# Patient Record
Sex: Female | Born: 1955 | Race: White | Hispanic: No | Marital: Married | Smoking: Never smoker
Health system: Southern US, Community
[De-identification: ages and names within clinical notes are randomized; demographics above are authoritative.]

## PROBLEM LIST (undated history)

## (undated) DIAGNOSIS — H3320 Serous retinal detachment, unspecified eye: Secondary | ICD-10-CM

## (undated) DIAGNOSIS — M722 Plantar fascial fibromatosis: Secondary | ICD-10-CM

## (undated) DIAGNOSIS — H35039 Hypertensive retinopathy, unspecified eye: Secondary | ICD-10-CM

## (undated) DIAGNOSIS — D472 Monoclonal gammopathy: Secondary | ICD-10-CM

## (undated) DIAGNOSIS — N920 Excessive and frequent menstruation with regular cycle: Secondary | ICD-10-CM

## (undated) DIAGNOSIS — D499 Neoplasm of unspecified behavior of unspecified site: Secondary | ICD-10-CM

## (undated) DIAGNOSIS — N200 Calculus of kidney: Secondary | ICD-10-CM

## (undated) DIAGNOSIS — H43811 Vitreous degeneration, right eye: Secondary | ICD-10-CM

## (undated) DIAGNOSIS — I447 Left bundle-branch block, unspecified: Secondary | ICD-10-CM

## (undated) DIAGNOSIS — I10 Essential (primary) hypertension: Secondary | ICD-10-CM

## (undated) DIAGNOSIS — K635 Polyp of colon: Secondary | ICD-10-CM

## (undated) DIAGNOSIS — H269 Unspecified cataract: Secondary | ICD-10-CM

## (undated) HISTORY — DX: Vitreous degeneration, right eye: H43.811

## (undated) HISTORY — DX: Hypertensive retinopathy, unspecified eye: H35.039

## (undated) HISTORY — DX: Serous retinal detachment, unspecified eye: H33.20

## (undated) HISTORY — DX: Polyp of colon: K63.5

## (undated) HISTORY — PX: OTHER SURGICAL HISTORY: SHX169

## (undated) HISTORY — DX: Plantar fascial fibromatosis: M72.2

## (undated) HISTORY — DX: Monoclonal gammopathy: D47.2

## (undated) HISTORY — PX: EYE SURGERY: SHX253

## (undated) HISTORY — PX: CATARACT EXTRACTION: SUR2

## (undated) HISTORY — DX: Excessive and frequent menstruation with regular cycle: N92.0

## (undated) HISTORY — DX: Calculus of kidney: N20.0

## (undated) HISTORY — DX: Left bundle-branch block, unspecified: I44.7

## (undated) HISTORY — DX: Essential (primary) hypertension: I10

## (undated) HISTORY — PX: RETINAL DETACHMENT SURGERY: SHX105

## (undated) HISTORY — DX: Neoplasm of unspecified behavior of unspecified site: D49.9

## (undated) HISTORY — DX: Unspecified cataract: H26.9

---

## 1985-07-31 DIAGNOSIS — D499 Neoplasm of unspecified behavior of unspecified site: Secondary | ICD-10-CM

## 1985-07-31 HISTORY — DX: Neoplasm of unspecified behavior of unspecified site: D49.9

## 1999-03-08 ENCOUNTER — Other Ambulatory Visit: Admission: RE | Admit: 1999-03-08 | Discharge: 1999-03-08 | Payer: Self-pay | Admitting: Obstetrics and Gynecology

## 2000-03-12 ENCOUNTER — Other Ambulatory Visit: Admission: RE | Admit: 2000-03-12 | Discharge: 2000-03-12 | Payer: Self-pay | Admitting: *Deleted

## 2001-03-21 ENCOUNTER — Other Ambulatory Visit: Admission: RE | Admit: 2001-03-21 | Discharge: 2001-03-21 | Payer: Self-pay | Admitting: *Deleted

## 2001-05-31 ENCOUNTER — Encounter: Admission: RE | Admit: 2001-05-31 | Discharge: 2001-05-31 | Payer: Self-pay | Admitting: Family Medicine

## 2001-05-31 ENCOUNTER — Encounter: Payer: Self-pay | Admitting: Family Medicine

## 2001-07-31 DIAGNOSIS — K635 Polyp of colon: Secondary | ICD-10-CM

## 2001-07-31 HISTORY — DX: Polyp of colon: K63.5

## 2001-09-05 ENCOUNTER — Ambulatory Visit (HOSPITAL_COMMUNITY): Admission: RE | Admit: 2001-09-05 | Discharge: 2001-09-05 | Payer: Self-pay | Admitting: Gastroenterology

## 2001-11-26 ENCOUNTER — Ambulatory Visit (HOSPITAL_COMMUNITY): Admission: RE | Admit: 2001-11-26 | Discharge: 2001-11-26 | Payer: Self-pay | Admitting: Gastroenterology

## 2001-11-26 ENCOUNTER — Encounter (INDEPENDENT_AMBULATORY_CARE_PROVIDER_SITE_OTHER): Payer: Self-pay | Admitting: Specialist

## 2001-11-28 HISTORY — PX: LAPAROSCOPIC CHOLECYSTECTOMY: SUR755

## 2001-12-09 ENCOUNTER — Encounter (INDEPENDENT_AMBULATORY_CARE_PROVIDER_SITE_OTHER): Payer: Self-pay

## 2001-12-09 ENCOUNTER — Encounter: Payer: Self-pay | Admitting: Surgery

## 2001-12-09 ENCOUNTER — Observation Stay (HOSPITAL_COMMUNITY): Admission: RE | Admit: 2001-12-09 | Discharge: 2001-12-10 | Payer: Self-pay | Admitting: Surgery

## 2002-07-03 ENCOUNTER — Other Ambulatory Visit: Admission: RE | Admit: 2002-07-03 | Discharge: 2002-07-03 | Payer: Self-pay | Admitting: *Deleted

## 2002-11-10 ENCOUNTER — Encounter (INDEPENDENT_AMBULATORY_CARE_PROVIDER_SITE_OTHER): Payer: Self-pay | Admitting: Specialist

## 2002-11-10 ENCOUNTER — Ambulatory Visit (HOSPITAL_BASED_OUTPATIENT_CLINIC_OR_DEPARTMENT_OTHER): Admission: RE | Admit: 2002-11-10 | Discharge: 2002-11-10 | Payer: Self-pay | Admitting: Obstetrics and Gynecology

## 2002-11-10 HISTORY — PX: ABLATION: SHX5711

## 2003-07-08 ENCOUNTER — Other Ambulatory Visit: Admission: RE | Admit: 2003-07-08 | Discharge: 2003-07-08 | Payer: Self-pay | Admitting: *Deleted

## 2004-07-12 ENCOUNTER — Other Ambulatory Visit: Admission: RE | Admit: 2004-07-12 | Discharge: 2004-07-12 | Payer: Self-pay | Admitting: *Deleted

## 2005-07-28 ENCOUNTER — Other Ambulatory Visit: Admission: RE | Admit: 2005-07-28 | Discharge: 2005-07-28 | Payer: Self-pay | Admitting: Obstetrics and Gynecology

## 2006-09-21 ENCOUNTER — Other Ambulatory Visit: Admission: RE | Admit: 2006-09-21 | Discharge: 2006-09-21 | Payer: Self-pay | Admitting: Obstetrics and Gynecology

## 2007-05-10 ENCOUNTER — Encounter: Admission: RE | Admit: 2007-05-10 | Discharge: 2007-05-10 | Payer: Self-pay | Admitting: Family Medicine

## 2007-07-04 ENCOUNTER — Encounter: Admission: RE | Admit: 2007-07-04 | Discharge: 2007-07-04 | Payer: Self-pay | Admitting: Gastroenterology

## 2007-07-11 ENCOUNTER — Ambulatory Visit: Payer: Self-pay | Admitting: Oncology

## 2007-07-30 LAB — CBC WITH DIFFERENTIAL/PLATELET
Basophils Absolute: 0.1 10*3/uL (ref 0.0–0.1)
EOS%: 1.9 % (ref 0.0–7.0)
Eosinophils Absolute: 0.1 10*3/uL (ref 0.0–0.5)
HGB: 14.4 g/dL (ref 11.6–15.9)
MCH: 30.6 pg (ref 26.0–34.0)
NEUT#: 4.7 10*3/uL (ref 1.5–6.5)
RDW: 13.3 % (ref 11.3–14.5)
WBC: 6.6 10*3/uL (ref 3.9–10.0)
lymph#: 1.4 10*3/uL (ref 0.9–3.3)

## 2007-08-02 LAB — IMMUNOFIXATION ELECTROPHORESIS
IgA: 82 mg/dL (ref 68–378)
IgG (Immunoglobin G), Serum: 1470 mg/dL (ref 694–1618)
IgM, Serum: 60 mg/dL (ref 60–263)

## 2007-08-02 LAB — LACTATE DEHYDROGENASE: LDH: 123 U/L (ref 94–250)

## 2007-08-02 LAB — BETA 2 MICROGLOBULIN, SERUM: Beta-2 Microglobulin: 1.54 mg/L (ref 1.01–1.73)

## 2007-08-02 LAB — COMPREHENSIVE METABOLIC PANEL
AST: 17 U/L (ref 0–37)
Albumin: 4.8 g/dL (ref 3.5–5.2)
BUN: 10 mg/dL (ref 6–23)
Calcium: 10.2 mg/dL (ref 8.4–10.5)
Chloride: 101 mEq/L (ref 96–112)
Potassium: 3.9 mEq/L (ref 3.5–5.3)

## 2007-08-07 LAB — UIFE/LIGHT CHAINS/TP QN, 24-HR UR
Free Kappa Lt Chains,Ur: 0.64 mg/dL (ref 0.04–1.51)
Free Lambda Excretion/Day: 0.79 mg/d
Free Lambda Lt Chains,Ur: 0.09 mg/dL (ref 0.08–1.01)
Time: 24 hours
Total Protein, Urine: 1.4 mg/dL
Volume, Urine: 875 mL

## 2007-08-07 LAB — CREATININE CLEARANCE, URINE, 24 HOUR
Creatinine, 24H Ur: 1143 mg/d (ref 700–1800)
Creatinine, Urine: 130.6 mg/dL

## 2007-10-08 ENCOUNTER — Other Ambulatory Visit: Admission: RE | Admit: 2007-10-08 | Discharge: 2007-10-08 | Payer: Self-pay | Admitting: Obstetrics and Gynecology

## 2007-10-22 ENCOUNTER — Ambulatory Visit: Payer: Self-pay | Admitting: Oncology

## 2008-04-21 ENCOUNTER — Ambulatory Visit: Payer: Self-pay | Admitting: Oncology

## 2008-04-23 LAB — COMPREHENSIVE METABOLIC PANEL
AST: 15 U/L (ref 0–37)
Alkaline Phosphatase: 65 U/L (ref 39–117)
BUN: 14 mg/dL (ref 6–23)
Creatinine, Ser: 0.74 mg/dL (ref 0.40–1.20)
Total Bilirubin: 0.5 mg/dL (ref 0.3–1.2)

## 2008-04-23 LAB — IGG, IGA, IGM
IgA: 71 mg/dL (ref 68–378)
IgM, Serum: 61 mg/dL (ref 60–263)

## 2008-04-23 LAB — CBC WITH DIFFERENTIAL/PLATELET
Basophils Absolute: 0 10*3/uL (ref 0.0–0.1)
EOS%: 1.7 % (ref 0.0–7.0)
HCT: 38.7 % (ref 34.8–46.6)
HGB: 13.3 g/dL (ref 11.6–15.9)
MCH: 31.2 pg (ref 26.0–34.0)
MCHC: 34.4 g/dL (ref 32.0–36.0)
MCV: 90.8 fL (ref 81.0–101.0)
MONO%: 7.5 % (ref 0.0–13.0)
NEUT%: 68.5 % (ref 39.6–76.8)
RDW: 12.9 % (ref 11.3–14.5)

## 2008-10-09 ENCOUNTER — Other Ambulatory Visit: Admission: RE | Admit: 2008-10-09 | Discharge: 2008-10-09 | Payer: Self-pay | Admitting: Obstetrics and Gynecology

## 2008-10-29 ENCOUNTER — Ambulatory Visit: Payer: Self-pay | Admitting: Oncology

## 2008-11-03 LAB — CBC WITH DIFFERENTIAL/PLATELET
Basophils Absolute: 0 10*3/uL (ref 0.0–0.1)
Eosinophils Absolute: 0.1 10*3/uL (ref 0.0–0.5)
HCT: 40 % (ref 34.8–46.6)
LYMPH%: 19.4 % (ref 14.0–49.7)
MCV: 89.7 fL (ref 79.5–101.0)
MONO#: 0.4 10*3/uL (ref 0.1–0.9)
MONO%: 5.7 % (ref 0.0–14.0)
NEUT#: 5.2 10*3/uL (ref 1.5–6.5)
NEUT%: 72.2 % (ref 38.4–76.8)
Platelets: 283 10*3/uL (ref 145–400)
RBC: 4.46 10*6/uL (ref 3.70–5.45)

## 2008-11-03 LAB — COMPREHENSIVE METABOLIC PANEL
BUN: 14 mg/dL (ref 6–23)
CO2: 25 mEq/L (ref 19–32)
Calcium: 10 mg/dL (ref 8.4–10.5)
Chloride: 109 mEq/L (ref 96–112)
Creatinine, Ser: 0.64 mg/dL (ref 0.40–1.20)
Glucose, Bld: 123 mg/dL — ABNORMAL HIGH (ref 70–99)
Total Bilirubin: 0.4 mg/dL (ref 0.3–1.2)

## 2008-11-03 LAB — IGG, IGA, IGM
IgA: 67 mg/dL — ABNORMAL LOW (ref 68–378)
IgG (Immunoglobin G), Serum: 1240 mg/dL (ref 694–1618)

## 2008-11-03 LAB — LACTATE DEHYDROGENASE: LDH: 131 U/L (ref 94–250)

## 2009-05-04 ENCOUNTER — Ambulatory Visit: Payer: Self-pay | Admitting: Oncology

## 2009-05-06 LAB — COMPREHENSIVE METABOLIC PANEL
ALT: 27 U/L (ref 0–35)
AST: 26 U/L (ref 0–37)
BUN: 9 mg/dL (ref 6–23)
Calcium: 9.4 mg/dL (ref 8.4–10.5)
Chloride: 107 mEq/L (ref 96–112)
Creatinine, Ser: 0.77 mg/dL (ref 0.40–1.20)
Total Bilirubin: 0.6 mg/dL (ref 0.3–1.2)

## 2009-05-06 LAB — CBC WITH DIFFERENTIAL/PLATELET
BASO%: 0.3 % (ref 0.0–2.0)
Basophils Absolute: 0 10*3/uL (ref 0.0–0.1)
EOS%: 1.8 % (ref 0.0–7.0)
HCT: 39.3 % (ref 34.8–46.6)
HGB: 13.6 g/dL (ref 11.6–15.9)
LYMPH%: 18 % (ref 14.0–49.7)
MCH: 31.1 pg (ref 25.1–34.0)
MCHC: 34.7 g/dL (ref 31.5–36.0)
MCV: 89.8 fL (ref 79.5–101.0)
NEUT%: 72.9 % (ref 38.4–76.8)
Platelets: 260 10*3/uL (ref 145–400)
lymph#: 1.2 10*3/uL (ref 0.9–3.3)

## 2009-05-06 LAB — LACTATE DEHYDROGENASE: LDH: 114 U/L (ref 94–250)

## 2009-05-06 LAB — IGG, IGA, IGM: IgG (Immunoglobin G), Serum: 1240 mg/dL (ref 694–1618)

## 2009-11-03 ENCOUNTER — Ambulatory Visit: Payer: Self-pay | Admitting: Oncology

## 2009-11-05 LAB — CBC WITH DIFFERENTIAL/PLATELET
BASO%: 0.1 % (ref 0.0–2.0)
Basophils Absolute: 0 10*3/uL (ref 0.0–0.1)
EOS%: 2.7 % (ref 0.0–7.0)
Eosinophils Absolute: 0.2 10*3/uL (ref 0.0–0.5)
HCT: 39.8 % (ref 34.8–46.6)
HGB: 13.9 g/dL (ref 11.6–15.9)
LYMPH%: 18.7 % (ref 14.0–49.7)
MCH: 32.5 pg (ref 25.1–34.0)
MCHC: 35 g/dL (ref 31.5–36.0)
MCV: 92.8 fL (ref 79.5–101.0)
MONO#: 0.4 10*3/uL (ref 0.1–0.9)
MONO%: 6.2 % (ref 0.0–14.0)
NEUT#: 4.5 10*3/uL (ref 1.5–6.5)
NEUT%: 72.3 % (ref 38.4–76.8)
Platelets: 238 10*3/uL (ref 145–400)
RBC: 4.28 10*6/uL (ref 3.70–5.45)
RDW: 13.1 % (ref 11.2–14.5)
WBC: 6.3 10*3/uL (ref 3.9–10.3)
lymph#: 1.2 10*3/uL (ref 0.9–3.3)

## 2009-11-05 LAB — COMPREHENSIVE METABOLIC PANEL
ALT: 35 U/L (ref 0–35)
CO2: 26 mEq/L (ref 19–32)
Calcium: 9.9 mg/dL (ref 8.4–10.5)
Chloride: 108 mEq/L (ref 96–112)
Creatinine, Ser: 0.67 mg/dL (ref 0.40–1.20)
Total Protein: 7 g/dL (ref 6.0–8.3)

## 2009-11-05 LAB — IGG, IGA, IGM: IgM, Serum: 48 mg/dL — ABNORMAL LOW (ref 60–263)

## 2009-11-05 LAB — LACTATE DEHYDROGENASE: LDH: 106 U/L (ref 94–250)

## 2010-05-04 ENCOUNTER — Ambulatory Visit: Payer: Self-pay | Admitting: Oncology

## 2010-05-06 LAB — COMPREHENSIVE METABOLIC PANEL
ALT: 74 U/L — ABNORMAL HIGH (ref 0–35)
AST: 48 U/L — ABNORMAL HIGH (ref 0–37)
Albumin: 4.4 g/dL (ref 3.5–5.2)
Alkaline Phosphatase: 73 U/L (ref 39–117)
BUN: 10 mg/dL (ref 6–23)
CO2: 21 mEq/L (ref 19–32)
Calcium: 9.6 mg/dL (ref 8.4–10.5)
Chloride: 106 mEq/L (ref 96–112)
Creatinine, Ser: 0.64 mg/dL (ref 0.40–1.20)
Glucose, Bld: 102 mg/dL — ABNORMAL HIGH (ref 70–99)
Potassium: 3.9 mEq/L (ref 3.5–5.3)
Sodium: 139 mEq/L (ref 135–145)
Total Bilirubin: 0.4 mg/dL (ref 0.3–1.2)
Total Protein: 6.9 g/dL (ref 6.0–8.3)

## 2010-05-06 LAB — CBC WITH DIFFERENTIAL/PLATELET
BASO%: 0.4 % (ref 0.0–2.0)
Basophils Absolute: 0 10*3/uL (ref 0.0–0.1)
EOS%: 3 % (ref 0.0–7.0)
Eosinophils Absolute: 0.2 10*3/uL (ref 0.0–0.5)
HCT: 40.8 % (ref 34.8–46.6)
HGB: 13.9 g/dL (ref 11.6–15.9)
LYMPH%: 20.3 % (ref 14.0–49.7)
MCH: 31.5 pg (ref 25.1–34.0)
MCHC: 33.9 g/dL (ref 31.5–36.0)
MCV: 92.9 fL (ref 79.5–101.0)
MONO#: 0.5 10*3/uL (ref 0.1–0.9)
MONO%: 8.3 % (ref 0.0–14.0)
NEUT#: 4.3 10*3/uL (ref 1.5–6.5)
NEUT%: 68 % (ref 38.4–76.8)
Platelets: 262 10*3/uL (ref 145–400)
RBC: 4.4 10*6/uL (ref 3.70–5.45)
RDW: 12.8 % (ref 11.2–14.5)
WBC: 6.3 10*3/uL (ref 3.9–10.3)
lymph#: 1.3 10*3/uL (ref 0.9–3.3)

## 2010-05-06 LAB — LACTATE DEHYDROGENASE: LDH: 121 U/L (ref 94–250)

## 2010-05-06 LAB — IGG, IGA, IGM
IgA: 61 mg/dL — ABNORMAL LOW (ref 68–378)
IgG (Immunoglobin G), Serum: 1200 mg/dL (ref 694–1618)
IgM, Serum: 47 mg/dL — ABNORMAL LOW (ref 60–263)

## 2010-07-31 DIAGNOSIS — I447 Left bundle-branch block, unspecified: Secondary | ICD-10-CM

## 2010-07-31 HISTORY — PX: OTHER SURGICAL HISTORY: SHX169

## 2010-07-31 HISTORY — DX: Left bundle-branch block, unspecified: I44.7

## 2010-11-15 ENCOUNTER — Other Ambulatory Visit (HOSPITAL_COMMUNITY): Payer: Self-pay | Admitting: Oncology

## 2010-11-15 ENCOUNTER — Encounter (HOSPITAL_BASED_OUTPATIENT_CLINIC_OR_DEPARTMENT_OTHER): Payer: 59 | Admitting: Oncology

## 2010-11-15 DIAGNOSIS — D472 Monoclonal gammopathy: Secondary | ICD-10-CM

## 2010-11-15 LAB — COMPREHENSIVE METABOLIC PANEL
ALT: 81 U/L — ABNORMAL HIGH (ref 0–35)
AST: 47 U/L — ABNORMAL HIGH (ref 0–37)
Creatinine, Ser: 0.74 mg/dL (ref 0.40–1.20)
Total Bilirubin: 0.5 mg/dL (ref 0.3–1.2)

## 2010-11-15 LAB — CBC WITH DIFFERENTIAL/PLATELET
BASO%: 0.3 % (ref 0.0–2.0)
EOS%: 2.4 % (ref 0.0–7.0)
HCT: 40.9 % (ref 34.8–46.6)
LYMPH%: 25.4 % (ref 14.0–49.7)
MCH: 31.3 pg (ref 25.1–34.0)
MCHC: 34.6 g/dL (ref 31.5–36.0)
MCV: 90.3 fL (ref 79.5–101.0)
MONO%: 8.8 % (ref 0.0–14.0)
NEUT%: 63.1 % (ref 38.4–76.8)
Platelets: 246 10*3/uL (ref 145–400)

## 2010-11-15 LAB — LACTATE DEHYDROGENASE: LDH: 124 U/L (ref 94–250)

## 2010-12-16 NOTE — Op Note (Signed)
Erika Bolton, Erika Bolton                          ACCOUNT NO.:  [id number]   MEDICAL RECORD NO.:  [id number]                   PATIENT TYPE:  AMB   LOCATION:  [facility]                                 FACILITY:  [facility]   PHYSICIAN:  Cynthia P. Romine, M.D.             DATE OF BIRTH:  Sep 21, 1955   DATE OF PROCEDURE:  11/10/2002  DATE OF DISCHARGE:                                 OPERATIVE REPORT   PREOPERATIVE DIAGNOSES:  Menorrhagia, intramural and submucous fibroids.   POSTOPERATIVE DIAGNOSES:  Menorrhagia, intramural and submucous fibroids.   PROCEDURE:  Hysteroscopy, hydrotherm ablation of the endometrium, removal of  endocervical polyp, endometrial biopsy.   SURGEON:  Cynthia P. Romine, M.D.   ANESTHESIA:  General endotracheal.   ESTIMATED BLOOD LOSS:  25 mL primarily from the tenaculum site on the  cervix.   COMPLICATIONS:  None.   DESCRIPTION OF PROCEDURE:  The patient was taken to the operating room and  after the induction of the adequate general anesthesia by LMA was placed in  the dorsal lithotomy position and prepped and draped in the usual fashion.  She had voided immediately prior to surgery therefore catheterization was  not done. The cervix was grasped at the anterior lip with a single tooth  tenaculum and sounded to 10 cm. The cervix was then dilated to a #21 Shawnie Pons.  The HEA hysteroscope was introduced, hysteroscopy was carried out. There was  some difficulty with achieving adequate lighting and the light source and  scope were changed to allow adequate visualization of the endometrial  cavity. The two known fibroids were visualized, photographic documentation  was taken. The left ostia could be seen, the right was obstructed by the  fibroid. It should be noted that prior to the hysteroscopy, the __________  endometrial biopsy was done and the specimen sent to pathology. Once there  was adequate visualization of the endometrial cavity, hydrotherm ablation  was  carried out by the manufacturer's protocol, this was done without  complication. A four prong tenaculum was necessary to be placed around the  scope to prevent leaking of the water back into the vagina. This worked  Agricultural consultant and there was no leaking during the procedure. Upon completion  of the procedure, photographic documentation was taken of the blanching of  the endometrium over the fibroid. The scope was removed. Polyp forceps were  used to remove the endocervical polyp and endocervix was gently curetted and  the specimen sent the pathology and the procedure was terminated. The  patient tolerated it well and went in satisfactory condition to post  anesthesia recovery.                                               Cynthia P. Romine, M.D.    CPR/MEDQ  D:  11/10/2002  T:  11/10/2002  Job:  [id number]

## 2011-05-11 ENCOUNTER — Ambulatory Visit
Admission: RE | Admit: 2011-05-11 | Discharge: 2011-05-11 | Disposition: A | Discharge status: Home / Self Care | Payer: 59 | Source: Ambulatory Visit | Attending: Cardiology | Admitting: Cardiology

## 2011-05-11 ENCOUNTER — Other Ambulatory Visit: Payer: Self-pay | Admitting: Cardiology

## 2011-05-11 DIAGNOSIS — Z01811 Encounter for preprocedural respiratory examination: Secondary | ICD-10-CM

## 2011-05-17 ENCOUNTER — Ambulatory Visit (HOSPITAL_COMMUNITY)
Admission: RE | Admit: 2011-05-17 | Discharge: 2011-05-17 | Disposition: A | Discharge status: Home / Self Care | Payer: 59 | Source: Ambulatory Visit | Attending: Cardiology | Admitting: Cardiology

## 2011-05-17 DIAGNOSIS — I447 Left bundle-branch block, unspecified: Secondary | ICD-10-CM | POA: Insufficient documentation

## 2011-05-17 DIAGNOSIS — R9439 Abnormal result of other cardiovascular function study: Secondary | ICD-10-CM | POA: Insufficient documentation

## 2011-05-17 DIAGNOSIS — Z01810 Encounter for preprocedural cardiovascular examination: Secondary | ICD-10-CM | POA: Insufficient documentation

## 2011-05-19 NOTE — Cardiovascular Report (Signed)
[name], [name] NO.:  [id number]  MEDICAL RECORD NO.:  [id number]  LOCATION:  [facility]                         FACILITY:  [facility]  PHYSICIAN:  Landry Corporal, MD DATE OF BIRTH:  [birth year]  DATE OF PROCEDURE:  05/17/2011 DATE OF DISCHARGE:  05/17/2011                           CARDIAC CATHETERIZATION   PERFORMING PHYSICIAN:  Landry Corporal, MD  PRIMARY CARDIOLOGIST:  Landry Corporal, MD  PRIMARY PHYSICIAN:  Erika R. Collins [place], MD  PROCEDURE PERFORMED: 1. Left heart catheterization via the 5-French right femoral artery     access. 2. Left ventriculography in the RAO projection with 10 mL of contrast     per second for a total of 30 mL. 3. Native coronary angiography.  INDICATIONS: 1. Left bundle branch block. 2. Abnormal nuclear stress test.  BRIEF HISTORY:  Erika Bolton is a very pleasant 55 year old woman who I saw in consultation for abnormal ECG, which showed a left bundle branch block.  An echocardiogram with a septal wall motion abnormality consistent with left bundle branch block.  She is relatively asymptomatic as far as any potential anginal-type symptom, however, to evaluate the etiology of left bundle branch block, she underwent nuclear stress test which demonstrated concern for possible anterior defect. Therefore, it was recommended that she undergo diagnostic cardiac catheterization.  The risks, benefits, alternatives and indications of the procedure were explained to the patient in detail as delineated in her last clinic note.  After some time of deliberation, the patient agreed to proceed, voiced understanding of the procedure.  Informed consent was obtained with signed form placed on the chart.  PROCEDURE:  The patient was brought to Second [facility] in the fasting state and sent to the holding area. She was prepped and draped in usual sterile fashion for femoral artery access.  After  time-out period performed, the patient was sedated with intravenous Versed and fentanyl.  The right groin was anesthetized using 1% subcutaneous lidocaine after the right femoral head was localized using tactile fluoroscopic guidance.  Initially, the right common femoral artery was accessed.  However, I was unable to successfully see the wire and therefore the wire was removed and needle removed with direct manual pressure being held for hemostasis.  I then relocated the femoral head.  I was able to re-access the common femoral artery with modified Seldinger technique and a 5-French sheath was easily placed at this time.  Sheath was aspirated and flushed.  First, a 5-French JL-4 followed by 5-French JR-4 catheter was advanced over wire and multiple angiographic views of the left and right coronary systems were obtained. The JR-4 catheter was then exchanged over wire for an angled pigtail catheter, which was used to advance across the aortic valve.  Left ventriculography was then performed in the RAO projection and then the catheter was pulled back across the aortic valve measuring pullback gradient.  The catheter was then removed completely out of body over a wire without any complications.  The patient was stable before, during, and after the procedure with no complications.  Estimated blood loss was 10 mL.  CATH LAB STATISTICS: 1. Sedation:  3 mg Versed and  50 mcg of fentanyl. 2. Contrast:  60 mL.  HEMODYNAMICS: 1. Central aortic pressure 121/70 mmHg with a mean of 92 mmHg. 2. Aortic pressure 126/7 mmHg with an EDP of 7 mmHg. 3. Left ventriculography demonstrated an ejection fraction of at least     50%-55% with no significant wall motion abnormalities noted in the     RAO projection.  ANGIOGRAPHIC FINDINGS: 1. The left main is a short vessel that trifurcates into an LAD. 2. Ramus intermedius.  It is a large branching ramus intermedius and a     circumflex vessel, which is  essentially a large obtuse marginal     branch. 3. The LAD is a moderate-to-large caliber vessel.  It gives rise to     mostly the large septal trunk and then another diagonal branch and     reaches down toward the apex.  There is no significant disease in     the LAD. 4. The ramus is a large caliber vessel, almost greater than the LAD     itself.  It bifurcates halfway down and branches into several small     branches distally and reaches almost down to the apex.  No disease     noted. 5. The circumflex is again mostly an obtuse marginal branch, which     trifurcates down to its bottom with a very small posterolateral     branch with not much in the way of an atrioventricular groove     vessel.  There is no significant disease in this vessel either. 6. The right coronary artery is a dominant vessel.  There is no     disease down to the crux and then bifurcates into the     posterolateral system of the right atrioventricular groove vessel     branching into 2 posterolateral branches.  No disease in the distal     portion of the vessel either.  The right posterior descending     artery also is a __________ large-sized vessel with no significant     disease.  IMPRESSION: 1. No angiographic evidence of any coronary artery disease to explain     the abnormal EKG or stress test results. 2. Preserved left ventricular ejection fraction with normal end-     diastolic pressure.  PLAN:  Standard post cath care and we will discharge the patient today. After bedrest, she will follow up with me in clinic just for postcatheterization followup.  We will continue with current medications.          ______________________________ Landry Corporal, MD     DWH/MEDQ  D:  05/17/2011  T:  05/18/2011  Job:  [id number]  cc:   Erika Bolton, M.D. [facility] Cardiac Catheter  Electronically Signed by Bryan Lemma MD on 05/19/2011 03:14:38 PM

## 2011-05-29 ENCOUNTER — Encounter: Payer: 59 | Admitting: Oncology

## 2011-05-29 ENCOUNTER — Other Ambulatory Visit (HOSPITAL_COMMUNITY): Payer: Self-pay | Admitting: Oncology

## 2011-05-30 LAB — IGG, IGA, IGM
IgA: 69 mg/dL (ref 69–380)
IgM, Serum: 40 mg/dL — ABNORMAL LOW (ref 52–322)

## 2011-10-30 ENCOUNTER — Telehealth: Payer: Self-pay | Admitting: Oncology

## 2011-10-30 NOTE — Telephone Encounter (Signed)
pt called to r/s 5/6 appts to 5/20   aom

## 2011-10-30 NOTE — Telephone Encounter (Signed)
lmonvm for pt re appt for 5/6. Schedule mailed.

## 2011-12-04 ENCOUNTER — Ambulatory Visit: Payer: 59 | Admitting: Oncology

## 2011-12-04 ENCOUNTER — Other Ambulatory Visit: Payer: 59 | Admitting: Lab

## 2011-12-08 ENCOUNTER — Telehealth: Payer: Self-pay | Admitting: Oncology

## 2011-12-08 NOTE — Telephone Encounter (Signed)
pt called in to r/s 5.20 appt to 6/7  aom

## 2011-12-18 ENCOUNTER — Ambulatory Visit: Payer: 59 | Admitting: Oncology

## 2011-12-18 ENCOUNTER — Other Ambulatory Visit: Payer: 59 | Admitting: Lab

## 2012-01-05 ENCOUNTER — Ambulatory Visit (HOSPITAL_BASED_OUTPATIENT_CLINIC_OR_DEPARTMENT_OTHER): Payer: 59 | Admitting: Oncology

## 2012-01-05 ENCOUNTER — Telehealth: Payer: Self-pay | Admitting: Oncology

## 2012-01-05 ENCOUNTER — Encounter: Payer: Self-pay | Admitting: Oncology

## 2012-01-05 ENCOUNTER — Other Ambulatory Visit (HOSPITAL_BASED_OUTPATIENT_CLINIC_OR_DEPARTMENT_OTHER): Payer: 59 | Admitting: Lab

## 2012-01-05 VITALS — BP 119/80 | HR 81 | Temp 97.7°F | Ht 64.0 in | Wt 158.4 lb

## 2012-01-05 DIAGNOSIS — Z86010 Personal history of colonic polyps: Secondary | ICD-10-CM

## 2012-01-05 DIAGNOSIS — D472 Monoclonal gammopathy: Secondary | ICD-10-CM

## 2012-01-05 DIAGNOSIS — G5790 Unspecified mononeuropathy of unspecified lower limb: Secondary | ICD-10-CM

## 2012-01-05 DIAGNOSIS — R7401 Elevation of levels of liver transaminase levels: Secondary | ICD-10-CM

## 2012-01-05 LAB — COMPREHENSIVE METABOLIC PANEL
ALT: 22 U/L (ref 0–35)
AST: 20 U/L (ref 0–37)
Albumin: 4 g/dL (ref 3.5–5.2)
Calcium: 10.2 mg/dL (ref 8.4–10.5)
Chloride: 104 mEq/L (ref 96–112)
Potassium: 3.8 mEq/L (ref 3.5–5.3)
Total Protein: 7.5 g/dL (ref 6.0–8.3)

## 2012-01-05 LAB — CBC WITH DIFFERENTIAL/PLATELET
BASO%: 0.7 % (ref 0.0–2.0)
Basophils Absolute: 0 10*3/uL (ref 0.0–0.1)
EOS%: 2.5 % (ref 0.0–7.0)
HGB: 13.6 g/dL (ref 11.6–15.9)
MCH: 30.5 pg (ref 25.1–34.0)
RDW: 12.9 % (ref 11.2–14.5)
lymph#: 1.3 10*3/uL (ref 0.9–3.3)

## 2012-01-05 NOTE — Telephone Encounter (Signed)
gv pt appt schedule for dec 2013 and June 2014.

## 2012-01-05 NOTE — Progress Notes (Signed)
CC:   Erika Bolton, M.D. Erika Bolton, M.D.  PROBLEM LIST: 1. 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  In     addition, a 24-hour urine protein determination from January 2009     was normal.  The patient has not had a metastatic bone survey or     bone marrow exam. 2. 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 3. Rosacea. 4. Intermittent elevations of SGOT and SGPT. 5. History of gastroesophageal reflux disease. 6. History of adenomatous polyps and positive family history of colon     cancer in the patient's father. 7. History of bilateral plantar fasciitis. 8. Status post laparoscopic cholecystectomy in 2003.  MEDICATIONS: 1. Aspirin 81 mg daily. 2. Wellbutrin XL 300 mg daily. 3. Calcium-vitamin D 250-100 mg daily. 4. Omega-3 fatty acids 1000 mg daily. 5. MetroGel 1% gel applied topically for rosacea. 6. Retin-A 0.01% gel applied at bedtime as needed for rosacea. 7. Prilosec 40 mg daily. 8. Desyrel 100 mg at bedtime.  HISTORY:  I saw Erika Bolton today for followup of her IgG kappa monoclonal gammopathy.  Erika Bolton was last seen by [place] on 11/15/2010. Quantitative immunoglobulins at that time and 6 months later were unchanged and within the normal range.  In general, Erika Bolton is doing well.  Erika Bolton denies any musculoskeletal pains or other symptoms to suggest progression of multiple myeloma.  Erika Bolton underwent a cardiac catheterization by Dr. Bryan Lemma in October 2012 for an abnormal stress test, EKG and 2D echocardiogram.  Erika Bolton underwent surgery on her left lower leg by Dr. Lestine Box in November 2012 in the hopes of improving her plantar fasciitis.  Erika Bolton has intermittent symptoms from her peripheral neuropathy in her feet.  Erika Bolton is bothered by her plantar fasciitis.  Her rosacea is  under good control.  Erika Bolton has been treated for urinary tract infections.  In general, Erika Bolton feels well at this time.  PHYSICAL EXAMINATION:  Erika Bolton looks well.  Erika Bolton recently turned 56. Weight is fairly stable at 158.4 pounds today.  Height 5 feet 4 inches, body surface area 1.8 sq. m.  Blood pressure 119/80.  Other vital signs are normal.  There is no scleral icterus.  Mouth and pharynx are benign. No peripheral adenopathy palpable.  Heart and lungs:  Normal.  Breasts: Not examined.  Erika Bolton has regular yearly mammograms.  Abdomen:  Benign with no organomegaly or masses palpable.  Extremities:  No peripheral edema or clubbing.  No areas of musculoskeletal tenderness.  Neurologic: Normal.  LABORATORY DATA:  White count 6.2, ANC 4.2, hemoglobin 13.6, hematocrit 40.7, platelets 241,000.  Chemistries today were entirely normal including an AST of 20, ALT of 22.  On 11/15/2010, AST was 47, ALT 81. Similar elevations were seen on 05/06/2010.  Quantitative immunoglobulins from today are pending.  Quantitative immunoglobulins from 05/29/2011 were as follows:  IgG was 1340, IgA 69 and IgM of 40. On 11/15/2010 those values were 1362 and 41, respectively.  There have been no significant changes in the quantitative immunoglobulins over the past 4-1/2 years.  IMAGING STUDIES: 1. Ultrasound of the abdomen from 05/31/2001 showed a 1.8 cm solitary     mobile gallstone without evidence of gallbladder wall thickening or     pericholecystic fluid.  There was an 8 mm cyst in the upper pole of  the right kidney. 2. MRI of the brain with and without IV contrast on 05/10/2007 showed     a small remote left cerebellar infarct.  There were scattered foci     of subcortical and periventricular deep white matter change.     Question of small-vessel disease was raised.  There was no acute     stroke or abnormal intracranial enhancement. 3. Ultrasound of the abdomen on 07/04/2007 showed no significant      abnormalities.  The gallbladder was surgically absent. 4. Chest x-ray, 2-view, from 05/11/2011 showed no acute abnormalities.  IMPRESSION AND PLAN:  Quantitative immunoglobulins have been  stable.  Today's values are pending. The patient has had a slightly low IgA and IgM value in the past and the value from October in April of 2012 for IgM was slightly low with normal being 52- 322 and the normal value for IgA being 69-380.  Nevertheless, the I feel that we are dealing with a monoclonal gammopathy that appears to be benign at least at this time.  The patient was reassured.  I suggested continued observation with repeat quantitative immunoglobulins in 6 months and repeat labs and visit in 1 year.  The patient was given a copy of her CBC and chemistries for today.  We may want to consider repeating urine studies in the future. ______________________________ Samul Dada, M.D. DSM/MEDQ  D:  01/05/2012  T:  01/05/2012  Job:  [id number]

## 2012-01-05 NOTE — Progress Notes (Signed)
This office note has been dictated.  #[id number]

## 2012-02-16 ENCOUNTER — Other Ambulatory Visit: Payer: Self-pay | Admitting: Dermatology

## 2012-02-24 ENCOUNTER — Emergency Department (HOSPITAL_COMMUNITY)
Admission: EM | Admit: 2012-02-24 | Discharge: 2012-02-24 | Disposition: A | Discharge status: Home / Self Care | Payer: 59 | Attending: Emergency Medicine | Admitting: Emergency Medicine

## 2012-02-24 ENCOUNTER — Encounter (HOSPITAL_COMMUNITY): Payer: Self-pay | Admitting: Emergency Medicine

## 2012-02-24 DIAGNOSIS — S81039A Puncture wound without foreign body, unspecified knee, initial encounter: Secondary | ICD-10-CM

## 2012-02-24 DIAGNOSIS — R209 Unspecified disturbances of skin sensation: Secondary | ICD-10-CM | POA: Insufficient documentation

## 2012-02-24 DIAGNOSIS — X58XXXA Exposure to other specified factors, initial encounter: Secondary | ICD-10-CM | POA: Insufficient documentation

## 2012-02-24 DIAGNOSIS — R0789 Other chest pain: Secondary | ICD-10-CM | POA: Insufficient documentation

## 2012-02-24 DIAGNOSIS — Z23 Encounter for immunization: Secondary | ICD-10-CM | POA: Insufficient documentation

## 2012-02-24 DIAGNOSIS — R110 Nausea: Secondary | ICD-10-CM | POA: Insufficient documentation

## 2012-02-24 DIAGNOSIS — S81009A Unspecified open wound, unspecified knee, initial encounter: Secondary | ICD-10-CM | POA: Insufficient documentation

## 2012-02-24 DIAGNOSIS — Z79899 Other long term (current) drug therapy: Secondary | ICD-10-CM | POA: Insufficient documentation

## 2012-02-24 DIAGNOSIS — I447 Left bundle-branch block, unspecified: Secondary | ICD-10-CM | POA: Insufficient documentation

## 2012-02-24 DIAGNOSIS — R51 Headache: Secondary | ICD-10-CM | POA: Insufficient documentation

## 2012-02-24 DIAGNOSIS — S91009A Unspecified open wound, unspecified ankle, initial encounter: Secondary | ICD-10-CM | POA: Insufficient documentation

## 2012-02-24 MED ORDER — TETANUS-DIPHTH-ACELL PERTUSSIS 5-2.5-18.5 LF-MCG/0.5 IM SUSP
0.5000 mL | Freq: Once | INTRAMUSCULAR | Status: AC
  Administered 2012-02-24: 0.5 mL via INTRAMUSCULAR
  Filled 2012-02-24: qty 0.5

## 2012-02-24 NOTE — ED Provider Notes (Signed)
History     CSN: [id number]  Arrival date & time 02/24/12  1242   First MD Initiated Contact with Patient 02/24/12 1306      Chief Complaint  Patient presents with  . Puncture Wound    (Consider location/radiation/quality/duration/timing/severity/associated sxs/prior treatment) HPI Comments: Patient reports she was gardening, sitting on a short stool when she looked down and noticed two small puncture wounds on her left knee.  Denies noticing when it happened, did not see any animal present in the area.  Denies pain involved with the wound.  States that she was worried and began having some chest tightness, mild headache, tingling in her bilateral hands.  States that has all resolved now.  Notes she has a known hx LBBB.    The history is provided by the patient and the spouse.    History reviewed. No pertinent past medical history.  History reviewed. No pertinent past surgical history.  History reviewed. No pertinent family history.  History  Substance Use Topics  . Smoking status: Not on file  . Smokeless tobacco: Not on file  . Alcohol Use: Not on file    OB History    Grav Para Term Preterm Abortions TAB SAB Ect Mult Living                  Review of Systems  Constitutional: Negative for fever.  Respiratory: Positive for chest tightness. Negative for shortness of breath.   Gastrointestinal: Positive for nausea. Negative for vomiting, abdominal pain and diarrhea.  Musculoskeletal: Negative for myalgias and arthralgias.  Neurological: Positive for headaches. Negative for weakness, light-headedness and numbness.    Allergies  Penicillins  Home Medications   Current Outpatient Rx  Name Route Sig Dispense Refill  . BUPROPION HCL ER (XL) 300 MG PO TB24 Oral Take 300 mg by mouth daily.    Marland Kitchen CRANBERRY 450 MG PO CAPS Oral Take 1 capsule by mouth daily.    Marland Kitchen METRONIDAZOLE 1 % EX GEL Topical Apply topically daily.    Marland Kitchen OMEPRAZOLE 40 MG PO CPDR Oral Take 40 mg by mouth  daily.    . TRAZODONE HCL 100 MG PO TABS Oral Take 100 mg by mouth at bedtime.    . TRETINOIN 0.01 % EX GEL Topical Apply topically at bedtime.      BP 142/79  Pulse 107  Temp 98 F (36.7 C) (Oral)  Resp 20  SpO2 97%  Physical Exam  Nursing note and vitals reviewed. Constitutional: She appears well-developed and well-nourished. No distress.  HENT:  Head: Normocephalic and atraumatic.  Neck: Neck supple.  Cardiovascular: Normal rate and regular rhythm.   Pulmonary/Chest: Effort normal and breath sounds normal. No respiratory distress. She has no wheezes. She has no rales.  Abdominal: Soft. She exhibits no distension. There is no tenderness. There is no rebound and no guarding.  Neurological: She is alert.  Skin: She is not diaphoretic.       ED Course  Procedures (including critical care time)  Labs Reviewed - No data to display No results found.   Date: 02/24/2012  Rate: 90  Rhythm: normal sinus rhythm  QRS Axis: left  Intervals: normal  ST/T Wave abnormalities: nonspecific T wave changes  Conduction Disutrbances:left bundle branch block  Narrative Interpretation:   Old EKG Reviewed: unchanged    1. Puncture wound of knee       MDM  Pt presents after noticing what appears to be a small bite mark on her left knee  after gardening.  Pt had mild symptoms of chest tightness, nausea, headache, bilateral hand tingling that resolved with time and had resolved prior to my seeing her.  Pt was concerned that she might have a snake bite and admits she thinks the other symptoms were due to anxiety.  ECG unchanged.  No local reaction noted around likely animal bite.  Discussed return precautions with patient and husband.  Tetanus updated.  Discussed all results with patient.  Pt given return precautions.  Pt verbalizes understanding and agrees with plan.           [place], [place] 02/24/12 1406

## 2012-02-24 NOTE — ED Notes (Signed)
Pt reports she felt something bite her left knee approx 1 hour pta.  2 small wounds, approx 2mm apart noted to lateral left knee.  Pt able to ambulate, denies pain to knee.  Pt states she is worried it may have been a snake bite but never saw a snake.

## 2012-02-26 NOTE — ED Provider Notes (Signed)
Medical screening examination/treatment/procedure(s) were performed by non-physician practitioner and as supervising physician I was immediately available for consultation/collaboration.   Brodie Correll, MD 02/26/12 0710 

## 2012-06-13 ENCOUNTER — Other Ambulatory Visit: Payer: Self-pay | Admitting: Dermatology

## 2012-07-05 ENCOUNTER — Other Ambulatory Visit (HOSPITAL_BASED_OUTPATIENT_CLINIC_OR_DEPARTMENT_OTHER): Payer: 59 | Admitting: Lab

## 2012-07-05 DIAGNOSIS — D472 Monoclonal gammopathy: Secondary | ICD-10-CM

## 2012-07-06 LAB — IGG, IGA, IGM: IgM, Serum: 33 mg/dL — ABNORMAL LOW (ref 52–322)

## 2012-07-07 NOTE — Progress Notes (Signed)
Quick Note:  Please notify patient and call/fax these results to patient's doctors. ______ 

## 2012-07-11 ENCOUNTER — Telehealth: Payer: Self-pay

## 2012-07-11 NOTE — Telephone Encounter (Signed)
Pt called and stated she would come pick up copy of labs. Labs placed in envelope at reception desk with [facility].

## 2012-12-05 ENCOUNTER — Encounter: Payer: Self-pay | Admitting: Obstetrics and Gynecology

## 2012-12-10 ENCOUNTER — Ambulatory Visit (INDEPENDENT_AMBULATORY_CARE_PROVIDER_SITE_OTHER): Payer: 59 | Admitting: Obstetrics and Gynecology

## 2012-12-10 ENCOUNTER — Encounter: Payer: Self-pay | Admitting: Obstetrics and Gynecology

## 2012-12-10 VITALS — BP 118/80 | Ht 64.25 in | Wt 165.0 lb

## 2012-12-10 DIAGNOSIS — Z0000 Encounter for general adult medical examination without abnormal findings: Secondary | ICD-10-CM

## 2012-12-10 DIAGNOSIS — Z01419 Encounter for gynecological examination (general) (routine) without abnormal findings: Secondary | ICD-10-CM

## 2012-12-10 LAB — POCT URINALYSIS DIPSTICK
Bilirubin, UA: NEGATIVE
Glucose, UA: NEGATIVE
Leukocytes, UA: NEGATIVE
Nitrite, UA: NEGATIVE

## 2012-12-10 MED ORDER — ESTRADIOL ACETATE 0.05 MG/24HR VA RING: 1.0000 | VAGINAL_RING | VAGINAL | Status: DC

## 2012-12-10 MED ORDER — ERGOCALCIFEROL 1.25 MG (50000 UT) PO CAPS: 50000.0000 [IU] | ORAL_CAPSULE | ORAL | Status: DC

## 2012-12-10 MED ORDER — PROGESTERONE MICRONIZED 100 MG PO CAPS: 100.0000 mg | ORAL_CAPSULE | Freq: Every day | ORAL | Status: DC

## 2012-12-10 NOTE — Patient Instructions (Signed)

## 2012-12-10 NOTE — Progress Notes (Addendum)
57 y.o.  Married  Caucasian female   G2P2002 here for annual exam.  Recently joined [employer].  No vag bleeding.    No LMP recorded. Patient is postmenopausal.          Sexually active: yes  The current method of family planning is post menopausal status.    Exercising: not now Last mammogram:  05/2012 neg Last pap smear:10/27/09 neg History of abnormal pap: no Smoking: never Alcohol: 5-6 glasses a week(wine) Last colonoscopy:2010 normal repeat in 2015 Last Bone Density:2007 Last cholesterol check: 2013 norma Hgb:       14.2         Urine:neg    Health Maintenance  Topic Date Due  . Pap Smear  11/24/1975  . Mammogram  11/24/2007  . Colonoscopy  11/24/2007  . Influenza Vaccine  03/31/2013  . Tetanus/tdap  02/23/2022    Family History  Problem Relation Age of Onset  . Colon cancer Father   . Breast cancer Maternal Aunt   . Breast cancer Maternal Aunt     Patient Active Problem List   Diagnosis Date Noted  . MGUS (monoclonal gammopathy of unknown significance) 01/05/2012    Past Medical History  Diagnosis Date  . Colon polyps 2003  . Precancerous lesion 1987    mole excised from vulva  . Menorrhagia     Past Surgical History  Procedure Laterality Date  . Ablation  11/10/02    Hysteroscopic Thermal  . Other surgical history      Sebaceous cyst on head removed 7x's  . Laparoscopic cholecystectomy  11/2001    Allergies: Penicillins  Current Outpatient Prescriptions  Medication Sig Dispense Refill  . buPROPion (WELLBUTRIN XL) 300 MG 24 hr tablet Take 300 mg by mouth daily.      . Calcium Carbonate-Vitamin D (CALTRATE 600+D) 600-400 MG-UNIT per chew tablet Chew 1 tablet by mouth daily.      Marland Kitchen CATS CLAW, UNCARIA TOMENTOSA, PO Take by mouth.      . Cranberry 450 MG CAPS Take 1 capsule by mouth daily.      . Cyanocobalamin (B-12 PO) Take by mouth.      . ergocalciferol (VITAMIN D2) 50000 UNITS capsule Take 50,000 Units by mouth once a week.      . Estradiol  Acetate (FEMRING) 0.05 MG/24HR RING Place vaginally.      . metroNIDAZOLE (METROGEL) 1 % gel Apply topically daily.      . Omega-3 Fatty Acids (FISH OIL PO) Take by mouth.      . Omeprazole (PRILOSEC PO) Take by mouth.      Marland Kitchen omeprazole (PRILOSEC) 40 MG capsule Take 40 mg by mouth daily.      . progesterone (PROMETRIUM) 100 MG capsule Take 100 mg by mouth daily.      . Pyridoxine HCl (B-6 PO) Take by mouth.      . traZODone (DESYREL) 100 MG tablet Take 100 mg by mouth at bedtime.      . tretinoin (RETIN-A) 0.01 % gel Apply topically at bedtime.      . vitamin C (ASCORBIC ACID) 500 MG tablet Take 500 mg by mouth daily.       No current facility-administered medications for this visit.  Stopped Wellbutrin on MD advice, feels great, not using femring or progesterone anymore  ROS: Pertinent items are noted in HPI.  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     There were no vitals taken for this visit.   Wt Readings from Last 3 Encounters:  01/05/12 158 lb 6.4 oz (71.85 kg)     Ht Readings from Last 3 Encounters:  01/05/12 5\' 4"  (1.626 m)    General appearance: alert, cooperative and appears stated age Head: Normocephalic, without obvious abnormality, atraumatic Neck: no adenopathy, supple, symmetrical, trachea midline and thyroid not enlarged, symmetric, no tenderness/mass/nodules Lungs: clear to auscultation bilaterally Breasts: Inspection negative, No nipple retraction or dimpling, No nipple discharge or bleeding, No axillary or supraclavicular adenopathy, Normal to palpation without dominant masses Heart: regular rate and rhythm Abdomen: soft, non-tender; bowel sounds normal; no masses,  no organomegaly Extremities: extremities normal, atraumatic, no cyanosis  or edema Skin: Skin color, texture, turgor normal. No rashes or lesions Lymph nodes: Cervical, supraclavicular, and axillary nodes normal. No abnormal inguinal nodes palpated Neurologic: Grossly normal   Pelvic: External genitalia:  no lesions              Urethra:  normal appearing urethra with no masses, tenderness or lesions              Bartholins and Skenes: normal                 Vagina: normal appearing vagina with normal color and discharge, no lesions              Cervix: normal appearance              Pap taken: yes        Bimanual Exam:  Uterus:  uterus is normal size, shape, consistency and nontender, mid, mobile                                      Adnexa: normal adnexa in size, nontender and no masses                                      Rectovaginal: Confirms                                      Anus:  normal sphincter tone, no lesions  A: normal meno exam, quit HRT in April 2012     Monoclonal gammopathy, Dr. Arline Asp at the [facility] monitor q 6 mos     FH breast cancer in 2 aunts and colon cancer in father           P: mammogram pap smear counseled on breast self exam, mammography screening, adequate intake of calcium and vitamin D, diet and exercise return annually or prn     An After Visit Summary was printed and given to the patient.

## 2012-12-11 ENCOUNTER — Telehealth: Payer: Self-pay | Admitting: *Deleted

## 2012-12-11 NOTE — Telephone Encounter (Signed)
Message copied by Ernest Haber on Wed Dec 11, 2012  4:10 PM ------      Message from: Meredeth Ide P      Created: Wed Dec 11, 2012  9:20 AM       Tell pt this is normal, but on the low side of normal. Ask her how much she is taking, and rec: she nudge up the dose a bit. ------

## 2012-12-12 MED ORDER — ERGOCALCIFEROL 1.25 MG (50000 UT) PO CAPS: 50000.0000 [IU] | ORAL_CAPSULE | ORAL | Status: DC

## 2012-12-12 NOTE — Telephone Encounter (Signed)
Medication problem resolved

## 2012-12-12 NOTE — Telephone Encounter (Signed)
Patient called about errors in medication.

## 2012-12-12 NOTE — Addendum Note (Signed)
Addended by: Luisa Dago on: 12/12/2012 09:51 AM   Modules accepted: Orders

## 2013-01-03 ENCOUNTER — Other Ambulatory Visit: Payer: 59 | Admitting: Lab

## 2013-01-03 ENCOUNTER — Ambulatory Visit: Payer: 59 | Admitting: Oncology

## 2013-01-10 ENCOUNTER — Telehealth: Payer: Self-pay | Admitting: Oncology

## 2013-01-10 ENCOUNTER — Other Ambulatory Visit (HOSPITAL_BASED_OUTPATIENT_CLINIC_OR_DEPARTMENT_OTHER): Payer: 59 | Admitting: Lab

## 2013-01-10 ENCOUNTER — Other Ambulatory Visit: Payer: Self-pay

## 2013-01-10 ENCOUNTER — Ambulatory Visit (HOSPITAL_BASED_OUTPATIENT_CLINIC_OR_DEPARTMENT_OTHER): Payer: 59 | Admitting: Oncology

## 2013-01-10 ENCOUNTER — Encounter: Payer: Self-pay | Admitting: Oncology

## 2013-01-10 VITALS — BP 137/76 | HR 82 | Temp 97.9°F | Resp 18 | Ht 64.0 in | Wt 165.9 lb

## 2013-01-10 DIAGNOSIS — D472 Monoclonal gammopathy: Secondary | ICD-10-CM

## 2013-01-10 LAB — CBC WITH DIFFERENTIAL/PLATELET
Eosinophils Absolute: 0.3 10*3/uL (ref 0.0–0.5)
LYMPH%: 26.9 % (ref 14.0–49.7)
MCV: 87.8 fL (ref 79.5–101.0)
MONO%: 7.4 % (ref 0.0–14.0)
NEUT#: 4.5 10*3/uL (ref 1.5–6.5)
Platelets: 264 10*3/uL (ref 145–400)
RBC: 4.54 10*6/uL (ref 3.70–5.45)

## 2013-01-10 LAB — COMPREHENSIVE METABOLIC PANEL (CC13)
Albumin: 4 g/dL (ref 3.5–5.0)
Alkaline Phosphatase: 91 U/L (ref 40–150)
BUN: 14.8 mg/dL (ref 7.0–26.0)
Calcium: 9.7 mg/dL (ref 8.4–10.4)
Chloride: 109 mEq/L — ABNORMAL HIGH (ref 98–107)
Glucose: 116 mg/dl — ABNORMAL HIGH (ref 70–99)
Potassium: 3.7 mEq/L (ref 3.5–5.1)

## 2013-01-10 LAB — LACTATE DEHYDROGENASE (CC13): LDH: 142 U/L (ref 125–245)

## 2013-01-10 LAB — IGG, IGA, IGM: IgG (Immunoglobin G), Serum: 1260 mg/dL (ref 690–1700)

## 2013-01-10 NOTE — Telephone Encounter (Signed)
gv and printed appt sched and avs for pt  °

## 2013-01-10 NOTE — Progress Notes (Signed)
This office note has been dictated.  #[id number]

## 2013-01-11 NOTE — Progress Notes (Signed)
CC:   Erika Bolton, M.D. Erika Bolton, M.D.  PROBLEM LIST:  1. 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 In  addition, a 24-hour urine protein determination from January 2009  was normal. The patient has not had a metastatic bone survey or  bone marrow exam.  2. Peripheral neuropathy involving both feet and legs diagnosed  December 2008.  3. Rosacea.  4. Intermittent elevations of SGOT and SGPT.  5. History of gastroesophageal reflux disease.  6. History of adenomatous polyps and positive family history of colon  cancer in the patient's father.  7. History of bilateral plantar fasciitis.  8. Status post laparoscopic cholecystectomy in 2003.   MEDICATIONS:  Reviewed and recorded. Current Outpatient Prescriptions  Medication Sig Dispense Refill  . aspirin 81 MG tablet Take 81 mg by mouth daily.      . calcium-vitamin D (OSCAL WITH D) 500-200 MG-UNIT per tablet Take 1 tablet by mouth daily.      . ergocalciferol (VITAMIN D2) 50000 UNITS capsule Take 1 capsule (50,000 Units total) by mouth every 14 (fourteen) days.  13 capsule  1  . Omeprazole (PRILOSEC PO) Take by mouth daily.       . Sulfacetamide Sodium-Sulfur (AVAR CLEANSER EX) Apply topically every other day.      . traZODone (DESYREL) 50 MG tablet Take 50 mg by mouth at bedtime.      . tretinoin microspheres (RETIN-A MICRO) 0.04 % gel Apply topically every other day.       No current facility-administered medications for this visit.     SMOKING HISTORY:  Patient has never smoked cigarettes.    HISTORY:  Erika Bolton was seen today for followup of her IgG kappa monoclonal gammopathy.  Erika Bolton was last seen by [place] on 01/05/2012.  She continues to do well with basically no complaints.  There have been no changes in her condition over the past year.  She does have a  little residual numbness on the outside aspect of her left leg and left foot related to surgery for plantar fasciitis.  There have been no changes over the past many years.  The patient is without any symptoms to suggest progression to multiple myeloma.  She has also not had any significant infections.  PHYSICAL EXAMINATION:  General:  She looks well.  Weight is 165 pounds 14.4 ounces, height 5 feet 4 inches, body surface area 1.84 sq m.  Vital Signs:  Blood pressure 137/76.  Other vital signs are normal.  O2 saturation on room air at rest was 100%.  HEENT:  No scleral icterus. Mouth and pharynx are benign.  No peripheral adenopathy palpable.  Heart and lungs:  Normal.  Breasts:  Not examined.  The patient has regular yearly mammograms.  Abdomen:  Benign with no organomegaly or masses palpable.  Extremities:  No peripheral edema or clubbing.  No areas of musculoskeletal tenderness.  Neurologic:  Exam is normal.  LABORATORY DATA:  Today, white count 7.5, ANC 4.5, hemoglobin 13.8, hematocrit 39.9, platelets 264,000.  Chemistries notable for a glucose of 116, otherwise normal.  Albumin 4.0, LDH 142.  Vitamin D level was 38 on 12/10/2012.  Quantitative immunoglobulins today are pending.  On 07/05/2012 IgG level was 1340, which is normal; IgA 56 and IgM 33.  If we look back at the patient's immunoglobulin levels, her IgG level has had some minor fluctuations,  but basically stable and in the normal range.  There has been a trend over the years for the IgA and IgM levels to decrease and they are now low.  For example, if we go back to the initial visit here on 07/30/2007, the IgG level was 1470, IgA 82, IgM 60, all of which are normal.  IMAGING STUDIES:  1. Ultrasound of the abdomen from 05/31/2001 showed a 1.8 cm solitary  mobile gallstone without evidence of gallbladder wall thickening or  pericholecystic fluid. There was an 8 mm cyst in the upper pole of  the right kidney.  2. MRI of  the brain with and without IV contrast on 05/10/2007 showed  a small remote left cerebellar infarct. There were scattered foci  of subcortical and periventricular deep white matter change.  Question of small-vessel disease was raised. There was no acute  stroke or abnormal intracranial enhancement.  3. Ultrasound of the abdomen on 07/04/2007 showed no significant  abnormalities. The gallbladder was surgically absent.  4. Chest x-ray, 2-view, from 05/11/2011 showed no acute abnormalities.   IMPRESSION AND PLAN:  I do not see any major changes thus far in the patient's quantitative immunoglobulins.  At this point, the patient's monoclonal gammopathy appears to be clinically benign.  Of note, however, is the fact that the IgA and IgM levels have actually decreased and are now abnormally low.  Again, the clinical significance of this is unclear.  All of this was explained to Erika Bolton in some detail in response to her questions.  As noted above, we have not carried out a metastatic bone survey or a bone marrow exam.  I do not think that they are really indicated at the present time, given the stability of the patient's M protein and lack of progression.  At this point, I feel that we can check quantitative immunoglobulins once a year.  I offered [name] the choice of whether she wanted to come back and have it done here at the [facility] or whether she would like her primary physician, Dr. Herb Grays, to do this.  At the moment, Erika Bolton would prefer to come back here, but she will discuss this with Dr. [name] and canceled the appointment if Dr. [name] will be monitoring the patient's quantitative immunoglobulins.  I believe a yearly check is sufficient, as long as there are no significant changes in the lab data or a clinical status.  At this point, an appointment was made for 1 year.  We will check CBC, chemistries, and quantitative  immunoglobulins.    ______________________________ Samul Dada, M.D. DSM/MEDQ  D:  01/10/2013  T:  01/11/2013  Job:  [id number]

## 2013-03-06 ENCOUNTER — Other Ambulatory Visit: Payer: 59

## 2013-03-12 ENCOUNTER — Telehealth: Payer: Self-pay | Admitting: Obstetrics and Gynecology

## 2013-03-12 ENCOUNTER — Other Ambulatory Visit: Payer: 59

## 2013-03-12 ENCOUNTER — Encounter: Payer: Self-pay | Admitting: Obstetrics and Gynecology

## 2013-03-12 NOTE — Telephone Encounter (Signed)
Thank you :)

## 2013-03-12 NOTE — Telephone Encounter (Signed)
Patient dnka lab appointment today. I was unable to reach patient by phone, I mailed a letter to patient.

## 2013-03-14 ENCOUNTER — Ambulatory Visit (INDEPENDENT_AMBULATORY_CARE_PROVIDER_SITE_OTHER): Payer: 59 | Admitting: Obstetrics & Gynecology

## 2013-03-14 ENCOUNTER — Other Ambulatory Visit: Payer: Self-pay | Admitting: Obstetrics and Gynecology

## 2013-03-14 DIAGNOSIS — Z0000 Encounter for general adult medical examination without abnormal findings: Secondary | ICD-10-CM

## 2013-03-15 LAB — VITAMIN D 25 HYDROXY (VIT D DEFICIENCY, FRACTURES): Vit D, 25-Hydroxy: 47 ng/mL (ref 30–89)

## 2013-03-17 DIAGNOSIS — Z0000 Encounter for general adult medical examination without abnormal findings: Secondary | ICD-10-CM

## 2013-03-17 MED ORDER — ERGOCALCIFEROL 1.25 MG (50000 UT) PO CAPS: 50000.0000 [IU] | ORAL_CAPSULE | ORAL | Status: DC

## 2013-03-17 NOTE — Progress Notes (Signed)
Vit D 50,000iu #26 po qowk 0 refills sent to [facility]

## 2013-03-31 LAB — HM COLONOSCOPY: HM Colonoscopy: NORMAL

## 2013-06-02 ENCOUNTER — Telehealth: Payer: Self-pay

## 2013-06-02 MED ORDER — VITAMIN D (ERGOCALCIFEROL) 1.25 MG (50000 UNIT) PO CAPS: 50000.0000 [IU] | ORAL_CAPSULE | ORAL | Status: DC

## 2013-06-02 NOTE — Telephone Encounter (Signed)
Patient was given RX x one year in 5/14 at [facility]. This rx [facility] [address]. Now patient is using [facility] mail order.

## 2013-07-17 DIAGNOSIS — D129 Benign neoplasm of anus and anal canal: Secondary | ICD-10-CM | POA: Insufficient documentation

## 2013-08-21 ENCOUNTER — Telehealth: Payer: Self-pay | Admitting: Obstetrics and Gynecology

## 2013-08-21 NOTE — Telephone Encounter (Signed)
Thank you for directing the patient to Dermatology.

## 2013-08-21 NOTE — Telephone Encounter (Signed)
Spoke with patient. She states she has a rash on her chest that has been ongoing for a few months. She states it "goes through periods of welts and raised bumps". We discussed if she has ever seen a dermatologist and she states that is who she called and I advised that we were her gyn office. Patient then apologized and states she was trying to reach her dermatologist. She will try to call them now. Advised if needs any further assistance to call us back. Patient thankful.  Routing to provider for final review. Patient agreeable to disposition. Will close encounter

## 2013-08-21 NOTE — Telephone Encounter (Signed)
Pt says she is having some kind of skin reaction and would like to schedule an appointment.

## 2013-11-24 ENCOUNTER — Encounter: Payer: Self-pay | Admitting: Obstetrics and Gynecology

## 2013-12-10 ENCOUNTER — Telehealth: Payer: Self-pay | Admitting: Obstetrics and Gynecology

## 2013-12-10 NOTE — Telephone Encounter (Signed)
Confirming patients appt °

## 2013-12-15 ENCOUNTER — Telehealth: Payer: Self-pay | Admitting: Internal Medicine

## 2013-12-15 NOTE — Telephone Encounter (Signed)
r/s from 6/19 per pt rqst

## 2013-12-17 ENCOUNTER — Ambulatory Visit: Payer: 59 | Admitting: Obstetrics and Gynecology

## 2014-01-14 ENCOUNTER — Telehealth: Payer: Self-pay | Admitting: Internal Medicine

## 2014-01-14 ENCOUNTER — Ambulatory Visit (HOSPITAL_BASED_OUTPATIENT_CLINIC_OR_DEPARTMENT_OTHER): Payer: 59 | Admitting: Internal Medicine

## 2014-01-14 ENCOUNTER — Other Ambulatory Visit (HOSPITAL_BASED_OUTPATIENT_CLINIC_OR_DEPARTMENT_OTHER): Payer: 59

## 2014-01-14 ENCOUNTER — Other Ambulatory Visit: Payer: Self-pay | Admitting: Medical Oncology

## 2014-01-14 VITALS — BP 132/81 | HR 89 | Temp 98.2°F | Resp 18 | Ht 64.0 in | Wt 172.4 lb

## 2014-01-14 DIAGNOSIS — D472 Monoclonal gammopathy: Secondary | ICD-10-CM

## 2014-01-14 LAB — CBC WITH DIFFERENTIAL/PLATELET
BASO%: 0.8 % (ref 0.0–2.0)
Basophils Absolute: 0.1 10*3/uL (ref 0.0–0.1)
EOS ABS: 0.2 10*3/uL (ref 0.0–0.5)
EOS%: 3.2 % (ref 0.0–7.0)
HCT: 40.6 % (ref 34.8–46.6)
HGB: 13.7 g/dL (ref 11.6–15.9)
LYMPH#: 1.8 10*3/uL (ref 0.9–3.3)
LYMPH%: 26.5 % (ref 14.0–49.7)
MCH: 29.8 pg (ref 25.1–34.0)
MCHC: 33.7 g/dL (ref 31.5–36.0)
MCV: 88.3 fL (ref 79.5–101.0)
MONO#: 0.5 10*3/uL (ref 0.1–0.9)
MONO%: 6.9 % (ref 0.0–14.0)
NEUT%: 62.6 % (ref 38.4–76.8)
NEUTROS ABS: 4.2 10*3/uL (ref 1.5–6.5)
PLATELETS: 264 10*3/uL (ref 145–400)
RBC: 4.6 10*6/uL (ref 3.70–5.45)
RDW: 12.9 % (ref 11.2–14.5)
WBC: 6.7 10*3/uL (ref 3.9–10.3)
nRBC: 0 % (ref 0–0)

## 2014-01-14 LAB — COMPREHENSIVE METABOLIC PANEL (CC13)
ALBUMIN: 4 g/dL (ref 3.5–5.0)
ALT: 36 U/L (ref 0–55)
ANION GAP: 9 meq/L (ref 3–11)
AST: 25 U/L (ref 5–34)
Alkaline Phosphatase: 91 U/L (ref 40–150)
BUN: 12.7 mg/dL (ref 7.0–26.0)
CALCIUM: 9.9 mg/dL (ref 8.4–10.4)
CHLORIDE: 108 meq/L (ref 98–109)
CO2: 26 meq/L (ref 22–29)
CREATININE: 0.8 mg/dL (ref 0.6–1.1)
Glucose: 98 mg/dl (ref 70–140)
Potassium: 4.3 mEq/L (ref 3.5–5.1)
SODIUM: 142 meq/L (ref 136–145)
TOTAL PROTEIN: 7.4 g/dL (ref 6.4–8.3)
Total Bilirubin: 0.31 mg/dL (ref 0.20–1.20)

## 2014-01-14 LAB — LACTATE DEHYDROGENASE (CC13): LDH: 149 U/L (ref 125–245)

## 2014-01-14 NOTE — Telephone Encounter (Signed)
cld & spoke with pt and gave pt time & date for appt

## 2014-01-14 NOTE — Progress Notes (Signed)
[facility] OFFICE PROGRESS NOTE  No PCP Per Patient No address on file  DIAGNOSIS: MGUS (monoclonal gammopathy of unknown significance) - Plan: CBC with Differential, Comprehensive metabolic panel (Cmet) - [facility], Lactate dehydrogenase (LDH) - [facility], IgG, IgA, IgM, Kappa/lambda light chains  Chief Complaint  Patient presents with  . mgus    CURRENT TREATMENT: Observation.  INTERVAL HISTORY: Erika Bolton 58 y.o. female with a history of IgG MGUS is here for an annual checkup.  She denies any recent hospitalizations or emergency room visits.  She denies any symptoms such as night sweats or fevers or weight lost.  She had a screening colonoscopy this past summer by Dr. Thana Farr with removal of rectal polyp.  Per patient, she states the pathology was reviewed at [facility] and without evidence of malignancy.   MEDICAL HISTORY: Past Medical History  Diagnosis Date  . Colon polyps 2003  . Precancerous lesion 1987    mole excised from vulva  . Menorrhagia   . Left bundle branch block 2012  . Plantar fasciitis     INTERIM HISTORY: has MGUS (monoclonal gammopathy of unknown significance) on her problem list.    ALLERGIES:  is allergic to penicillins.  MEDICATIONS: has a current medication list which includes the following prescription(s): aspirin, beta carotene w/minerals, calcium-vitamin d, omeprazole, trazodone, tretinoin microspheres, and vitamin d (ergocalciferol).  SURGICAL HISTORY:  Past Surgical History  Procedure Laterality Date  . Ablation  11/10/02    Hysteroscopic Thermal  . Other surgical history      Sebaceous cyst on head removed 7x's  . Laparoscopic cholecystectomy  11/2001  . Cyst on scalp      sebaccous(multiple times as a teen)  . Left leg muscle surgery  2012   PROBLEM LIST:  1. 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 In  addition, a 24-hour urine protein determination from January 2009  was normal. The patient has not had a metastatic bone survey or  bone marrow exam.  2. Peripheral neuropathy involving both feet and legs diagnosed  December 2008.  3. Rosacea.  4. Intermittent elevations of SGOT and SGPT.  5. History of gastroesophageal reflux disease.  6. History of adenomatous polyps and positive family history of colon  cancer in the patient's father.  7. History of bilateral plantar fasciitis.  8. Status post laparoscopic cholecystectomy in 2003.   REVIEW OF SYSTEMS:   Constitutional: Denies fevers, chills or abnormal weight loss Eyes: Denies blurriness of vision Ears, nose, mouth, throat, and face: Denies mucositis or sore throat Respiratory: Denies cough, dyspnea or wheezes Cardiovascular: Denies palpitation, chest discomfort or lower extremity swelling Gastrointestinal:  Denies nausea, heartburn or change in bowel habits Skin: Denies abnormal skin rashes Lymphatics: Denies new lymphadenopathy or easy bruising Neurological:Denies numbness, tingling or new weaknesses Behavioral/Psych: Mood is stable, no new changes  All other systems were reviewed with the patient and are negative.  PHYSICAL EXAMINATION: ECOG PERFORMANCE STATUS: 0 - Asymptomatic  Blood pressure 132/81, pulse 89, temperature 98.2 F (36.8 C), temperature source Oral, resp. rate 18, height 5\' 4"  (1.626 m), weight 172 lb 6.4 oz (78.2 kg), last menstrual period 08/01/2007.  GENERAL:alert, no distress and comfortable; well developed and well nourished.  SKIN: skin color, texture, turgor are normal, no rashes or significant lesions EYES: normal, Conjunctiva are pink and non-injected, sclera clear OROPHARYNX:no exudate, no erythema and lips,  buccal mucosa, and tongue normal  NECK: supple, thyroid normal size, non-tender, without nodularity LYMPH:  no palpable lymphadenopathy in the cervical, axillary or  supraclavicular LUNGS: clear to auscultation with normal breathing effort, no wheezes or rhonchi HEART: regular rate & rhythm and no murmurs and no lower extremity edema ABDOMEN:abdomen soft, non-tender and normal bowel sounds Musculoskeletal:no cyanosis of digits and no clubbing  NEURO: alert & oriented x 3 with fluent speech, no focal motor/sensory deficits  Labs:  Lab Results  Component Value Date   WBC 6.7 01/14/2014   HGB 13.7 01/14/2014   HCT 40.6 01/14/2014   MCV 88.3 01/14/2014   PLT 264 01/14/2014   NEUTROABS 4.2 01/14/2014      Chemistry      Component Value Date/Time   NA 142 01/14/2014 1518   NA 138 01/05/2012 1546   K 4.3 01/14/2014 1518   K 3.8 01/05/2012 1546   CL 109* 01/10/2013 1446   CL 104 01/05/2012 1546   CO2 26 01/14/2014 1518   CO2 27 01/05/2012 1546   BUN 12.7 01/14/2014 1518   BUN 12 01/05/2012 1546   CREATININE 0.8 01/14/2014 1518   CREATININE 0.67 01/05/2012 1546   CREATININE 0.65 08/05/2007 0816      Component Value Date/Time   CALCIUM 9.9 01/14/2014 1518   CALCIUM 10.2 01/05/2012 1546   ALKPHOS 91 01/14/2014 1518   ALKPHOS 83 01/05/2012 1546   AST 25 01/14/2014 1518   AST 20 01/05/2012 1546   ALT 36 01/14/2014 1518   ALT 22 01/05/2012 1546   BILITOT 0.31 01/14/2014 1518   BILITOT 0.2* 01/05/2012 1546       Basic Metabolic Panel:  Recent Labs Lab 01/14/14 1518  NA 142  K 4.3  CO2 26  GLUCOSE 98  BUN 12.7  CREATININE 0.8  CALCIUM 9.9   GFR Estimated Creatinine Clearance: 79.5 ml/min (by C-G formula based on Cr of 0.8). Liver Function Tests:  Recent Labs Lab 01/14/14 1518  AST 25  ALT 36  ALKPHOS 91  BILITOT 0.31  PROT 7.4  ALBUMIN 4.0   No results found for this basename: LIPASE, AMYLASE,  in the last 168 hours No results found for this basename: AMMONIA,  in the last 168 hours Coagulation profile No results found for this basename: INR, PROTIME,  in the last 168 hours  CBC:  Recent Labs Lab 01/14/14 1516  WBC 6.7  NEUTROABS 4.2  HGB 13.7   HCT 40.6  MCV 88.3  PLT 264    Anemia work up No results found for this basename: VITAMINB12, FOLATE, FERRITIN, TIBC, IRON, RETICCTPCT,  in the last 72 hours  Studies:  No results found.   RADIOGRAPHIC STUDIES: No results found.  ASSESSMENT: Erika Bolton 58 y.o. female with a history of MGUS (monoclonal gammopathy of unknown significance) - Plan: CBC with Differential, Comprehensive metabolic panel (Cmet) - [facility], Lactate dehydrogenase (LDH) - [facility], IgG, IgA, IgM, Kappa/lambda light chains   PLAN:   1. IgG kappa MGUS. --Clinically, she is doing well.  Her labs continue to demonstrate a normal calcium, creatinine and hemoglobin.    As noted above, we have not carried out a metastatic bone survey or a bone marrow exam. I do not think that they are really indicated at the present time, given the stability of the patient's M protein and lack of progression. At this point, I feel that we can check quantitative immunoglobulins once a year.  2. Follow-up.  -- We will check CBC,  chemistries, and quantitative immunoglobulins in one year with her annual follow up.   All questions were answered. The patient knows to call the clinic with any problems, questions or concerns. We can certainly see the patient much sooner if necessary.  I spent 15 minutes counseling the patient face to face. The total time spent in the appointment was 25 minutes.    CHISM, DAVID, MD 01/14/2014 4:07 PM

## 2014-01-15 LAB — IGG, IGA, IGM
IGM, SERUM: 28 mg/dL — AB (ref 52–322)
IgA: 53 mg/dL — ABNORMAL LOW (ref 69–380)
IgG (Immunoglobin G), Serum: 1160 mg/dL (ref 690–1700)

## 2014-01-16 ENCOUNTER — Ambulatory Visit: Payer: 59

## 2014-01-16 ENCOUNTER — Other Ambulatory Visit: Payer: 59

## 2014-05-01 ENCOUNTER — Ambulatory Visit: Payer: 59 | Admitting: Neurology

## 2014-05-07 ENCOUNTER — Ambulatory Visit: Payer: 59 | Admitting: Neurology

## 2014-07-17 ENCOUNTER — Ambulatory Visit (INDEPENDENT_AMBULATORY_CARE_PROVIDER_SITE_OTHER): Payer: 59 | Admitting: Neurology

## 2014-07-17 ENCOUNTER — Encounter: Payer: Self-pay | Admitting: Neurology

## 2014-07-17 VITALS — BP 124/79 | HR 83 | Temp 98.8°F | Ht 64.5 in | Wt 173.0 lb

## 2014-07-17 DIAGNOSIS — R51 Headache: Secondary | ICD-10-CM

## 2014-07-17 DIAGNOSIS — R0683 Snoring: Secondary | ICD-10-CM

## 2014-07-17 DIAGNOSIS — R351 Nocturia: Secondary | ICD-10-CM

## 2014-07-17 DIAGNOSIS — E663 Overweight: Secondary | ICD-10-CM

## 2014-07-17 DIAGNOSIS — R519 Headache, unspecified: Secondary | ICD-10-CM

## 2014-07-17 DIAGNOSIS — G478 Other sleep disorders: Secondary | ICD-10-CM

## 2014-07-17 NOTE — Patient Instructions (Signed)

## 2014-07-17 NOTE — Progress Notes (Signed)
Subjective:    Patient ID: Erika Bolton is a 58 y.o. female.  HPI     Star Age, MD, PhD [facility] [address], [address] [place], [place] [address]  Dear Erika Bolton,  I saw your patient, Erika Bolton, upon your kind request in my neurologic clinic today for initial consultation of her sleep disorder, in particular, concern for underlying obstructive sleep apnea. The patient is unaccompanied today. As you know, Erika Bolton is a 58 year old right-handed woman with an underlying medical history of colonic polyps, left bundle branch block, plantar fasciatis s/p surgery 3 years ago, MGUS, and overweight state, who reports snoring and witnessed apneas, per husband. This has been ongoing for about 6 months. She has had morning HAs, and has nocturia 1-2 per night. She has sleep disruption. She denies a FHx of OSA, and has no RLS Sx and is not known to kick in her sleep. Her bedtime is 10 PM and she falls asleep quickly. She typically wakes up in the early morning hours around 3 AM and between 3 AM and wake time of 6 AM she goes in and out of light sleep. She does not wake up rested and feels groggy first thing in the morning. She does not watch TV in bed. She drinks 1-1-1/2 cups of coffee per day. She drinks 1-1-1/2 glasses of wine in the evenings. She does not smoke. 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 She does not nap. Her Epworth sleepiness score is 5 out of 24 today.  Her Past Medical History Is Significant For: Past Medical History  Diagnosis Date  . Colon polyps 2003  . Precancerous lesion 1987    mole excised from vulva  . Menorrhagia   . Left bundle branch block 2012  . Plantar fasciitis     Her Past Surgical History Is Significant For: Past Surgical History  Procedure Laterality Date  . Ablation  11/10/02    Hysteroscopic Thermal  . Other surgical history      Sebaceous cyst on head removed 7x's   . Laparoscopic cholecystectomy  11/2001  . Cyst on scalp      sebaccous(multiple times as a teen)  . Left leg muscle surgery  2012    Her Family History Is Significant For: Family History  Problem Relation Age of Onset  . Colon cancer Father   . Alzheimer's disease Father   . Emphysema Father   . COPD Father   . Breast cancer Maternal Aunt   . Heart attack Mother   . Cancer Brother     peritod gland cancer    Her Social History Is Significant For: History   Social History  . Marital Status: Married    Spouse Name: N/A    Number of Children: 2  . Years of Education: M.ED.   Occupational History  .      [place] History Main Topics  . Smoking status: Never Smoker   . Smokeless tobacco: Never Used  . Alcohol Use: 3.0 - 3.5 oz/week    6-7 drink(s) per week     Comment: 6-7 glasses a week of wine  . Drug Use: No  . Sexual Activity:    Partners: Male    Birth Control/ Protection: Post-menopausal   Other Topics Concern  . None   Social History Narrative    Her Allergies Are:  Allergies  Allergen Reactions  . Penicillins Swelling  Throat swelling  :   Her Current Medications Are:  Outpatient Encounter Prescriptions as of 07/17/2014  Medication Sig  . aspirin 81 MG tablet Take 81 mg by mouth daily.  . beta carotene w/minerals (OCUVITE) tablet Take 1 tablet by mouth daily.  . Ivermectin (SOOLANTRA) 1 % CREA Apply topically. Once daily to face  . Omeprazole (PRILOSEC PO) Take by mouth daily.   Marland Kitchen tretinoin microspheres (RETIN-A MICRO) 0.04 % gel Apply topically every other day. Uses twice a week  . [DISCONTINUED] calcium-vitamin D (OSCAL WITH D) 500-200 MG-UNIT per tablet Take 1 tablet by mouth daily.  . [DISCONTINUED] traZODone (DESYREL) 50 MG tablet Take 50 mg by mouth at bedtime.  . [DISCONTINUED] Vitamin D, Ergocalciferol, (DRISDOL) 50000 UNITS CAPS capsule Take 1 capsule (50,000 Units total) by mouth every 14 (fourteen) days. (Patient not  taking: Reported on 07/17/2014)  :  Review of Systems:  Out of a complete 14 point review of systems, all are reviewed and negative with the exception of these symptoms as listed below:   Review of Systems  Neurological: Positive for numbness.       Snoring    Objective:  Neurologic Exam  Physical Exam Physical Examination:   Filed Vitals:   07/17/14 0921  BP: 124/79  Pulse: 83  Temp: 98.8 F (37.1 C)    General Examination: The patient is a very pleasant 58 y.o. female in no acute distress. She appears well-developed and well-nourished and well groomed.   HEENT: Normocephalic, atraumatic, pupils are equal, round and reactive to light and accommodation. Funduscopic exam is normal with sharp disc margins noted. Extraocular tracking is good without limitation to gaze excursion or nystagmus noted. Normal smooth pursuit is noted. Hearing is grossly intact. Tympanic membranes are clear bilaterally. Face is symmetric with normal facial animation and normal facial sensation. Speech is clear with no dysarthria noted. There is no hypophonia. There is no lip, neck/head, jaw or voice tremor. Neck is supple with full range of passive and active motion. There are no carotid bruits on auscultation. Oropharynx exam reveals: mild mouth dryness, good dental hygiene and mild to moderate airway crowding, due to narrow airway entry, mildly enlarged uvula, slightly longer tongue and tonsils in place of 1+ in size bilaterally. Mallampati is class II. Tongue protrudes centrally and palate elevates symmetrically. Neck size is 13-7/8 inches. She has a very tiny overbite.  Chest: Clear to auscultation without wheezing, rhonchi or crackles noted.  Heart: S1+S2+0, regular and normal without murmurs, rubs or gallops noted.   Abdomen: Soft, non-tender and non-distended with normal bowel sounds appreciated on auscultation.  Extremities: There is no pitting edema in the distal lower extremities bilaterally.  Pedal pulses are intact.  Skin: Warm and dry without trophic changes noted. There are no varicose veins.  Musculoskeletal: exam reveals no obvious joint deformities, tenderness or joint swelling or erythema.   Neurologically:  Mental status: The patient is awake, alert and oriented in all 4 spheres. Her immediate and remote memory, attention, language skills and fund of knowledge are appropriate. There is no evidence of aphasia, agnosia, apraxia or anomia. Speech is clear with normal prosody and enunciation. Thought process is linear. Mood is normal and affect is normal.  Cranial nerves II - XII are as described above under HEENT exam. In addition: shoulder shrug is normal with equal shoulder height noted. Motor exam: Normal bulk, strength and tone is noted. There is no drift, tremor or rebound. Romberg is negative. Reflexes are 2+ throughout.  Babinski: Toes are flexor bilaterally. Fine motor skills and coordination: intact with normal finger taps, normal hand movements, normal rapid alternating patting, normal foot taps and normal foot agility.  Cerebellar testing: No dysmetria or intention tremor on finger to nose testing. Heel to shin is unremarkable bilaterally. There is no truncal or gait ataxia.  Sensory exam: intact to light touch, pinprick, vibration, temperature sense in the upper and lower extremities.  Gait, station and balance: She stands easily. No veering to one side is noted. No leaning to one side is noted. Posture is age-appropriate and stance is narrow based. Gait shows normal stride length and normal pace. No problems turning are noted. She turns en bloc. Tandem walk is unremarkable. Intact toe and heel stance is noted.               Assessment and Plan:   In summary, FARRON LAFOND is a very pleasant 58 y.o.-year old female with an underlying medical history of colonic polyps, left bundle branch block, plantar fasciatis s/p surgery 3 years ago, MGUS, and overweight state, with a  history and physical exam concerning for obstructive sleep apnea (OSA). I had a long chat with the patient about my findings and the diagnosis of OSA, its prognosis and treatment options. We talked about medical treatments, surgical interventions and non-pharmacological approaches. I explained in particular the risks and ramifications of untreated moderate to severe OSA, especially with respect to developing cardiovascular disease down the Road, including congestive heart failure, difficult to treat hypertension, cardiac arrhythmias, or stroke. Even type 2 diabetes has, in part, been linked to untreated OSA. Symptoms of untreated OSA include daytime sleepiness, memory problems, mood irritability and mood disorder such as depression and anxiety, lack of energy, as well as recurrent headaches, especially morning headaches. We talked about trying to maintain a healthy lifestyle in general, as well as the importance of weight control. I encouraged the patient to eat healthy, exercise daily and keep well hydrated, to keep a scheduled bedtime and wake time routine, to not skip any meals and eat healthy snacks in between meals. I advised the patient not to drive when feeling sleepy. I recommended the following at this time: sleep study with potential positive airway pressure titration. (We will score hypopneas at 4% and split the sleep study into diagnostic and treatment portion, if the estimated. 2 hour AHI is >20/h).   I explained the sleep test procedure to the patient and also outlined possible surgical and non-surgical treatment options of OSA, including the use of a custom-made dental device (which would require a referral to a specialist dentist or oral surgeon), upper airway surgical options, such as pillar implants, radiofrequency surgery, tongue base surgery, and UPPP (which would involve a referral to an ENT surgeon). Rarely, jaw surgery such as mandibular advancement may be considered.  I also explained  the CPAP treatment option to the patient, who indicated that she would be willing to try CPAP if the need arises. I explained the importance of being compliant with PAP treatment, not only for insurance purposes but primarily to improve Her symptoms, and for the patient's long term health benefit, including to reduce Her cardiovascular risks. I answered all her questions today and the patient was in agreement. I would like to see her back after the sleep study is completed and encouraged her to call with any interim questions, concerns, problems or updates.   Thank you very much for allowing me to participate in the care of this  nice patient. If I can be of any further assistance to you please do not hesitate to call me at [phone].  Sincerely,   Star Age, MD, PhD

## 2014-07-27 ENCOUNTER — Encounter: Payer: Self-pay | Admitting: Nurse Practitioner

## 2014-07-27 ENCOUNTER — Ambulatory Visit (INDEPENDENT_AMBULATORY_CARE_PROVIDER_SITE_OTHER): Payer: 59 | Admitting: Nurse Practitioner

## 2014-07-27 VITALS — BP 112/66 | HR 68 | Resp 16 | Ht 64.75 in | Wt 172.0 lb

## 2014-07-27 DIAGNOSIS — Z0000 Encounter for general adult medical examination without abnormal findings: Secondary | ICD-10-CM

## 2014-07-27 DIAGNOSIS — D472 Monoclonal gammopathy: Secondary | ICD-10-CM

## 2014-07-27 DIAGNOSIS — E559 Vitamin D deficiency, unspecified: Secondary | ICD-10-CM

## 2014-07-27 DIAGNOSIS — Z01419 Encounter for gynecological examination (general) (routine) without abnormal findings: Secondary | ICD-10-CM

## 2014-07-27 LAB — POCT URINALYSIS DIPSTICK
BILIRUBIN UA: NEGATIVE
Blood, UA: NEGATIVE
GLUCOSE UA: NEGATIVE
KETONES UA: NEGATIVE
Leukocytes, UA: NEGATIVE
Nitrite, UA: NEGATIVE
PH UA: 5
Protein, UA: NEGATIVE
Urobilinogen, UA: NEGATIVE

## 2014-07-27 NOTE — Patient Instructions (Signed)

## 2014-07-27 NOTE — Progress Notes (Signed)
58 y.o. G36P2002 Married Caucasian Fe here for annual exam.  Some increase in warmth but no hot flashes. No vaginal dryness. She is feeling well.  Sees oncologist now yearly.  Patient's last menstrual period was 08/01/2007.          Sexually active: Yes.    The current method of family planning is post menopausal status.    Exercising: No.  The patient does not participate in regular exercise at present. Smoker:  no  Health Maintenance: Pap:  12/10/12 Neg. HR HPV:Neg MMG:  06/2014 Normal - per pt done at [facility] - will get a copy Colonoscopy: 2014 repeat 5 years - Per pt BMD:   06/26/2006 T Score: spine 0.2; left hip neck -0.3 TDaP:  2009 Labs: Oncology 12/2013 (we check Vit D) Urine: negative   reports that she has never smoked. She has never used smokeless tobacco. She reports that she drinks about 3.0 - 4.2 oz of alcohol per week. She reports that she does not use illicit drugs.  Past Medical History  Diagnosis Date  . Colon polyps 2003  . Precancerous lesion 1987    mole excised from vulva  . Menorrhagia   . Left bundle branch block 2012  . Plantar fasciitis     Past Surgical History  Procedure Laterality Date  . Ablation  11/10/02    Hysteroscopic Thermal  . Other surgical history      Sebaceous cyst on head removed 7x's  . Laparoscopic cholecystectomy  11/2001  . Cyst on scalp      sebaccous(multiple times as a teen)  . Left leg muscle surgery  2012  . Gastro surgery  2012    Current Outpatient Prescriptions  Medication Sig Dispense Refill  . aspirin 81 MG tablet Take 81 mg by mouth daily.    . Ivermectin (SOOLANTRA) 1 % CREA Apply topically. Once daily to face    . Omeprazole (PRILOSEC PO) Take by mouth daily.     Marland Kitchen tretinoin microspheres (RETIN-A MICRO) 0.04 % gel Apply topically every other day. Uses twice a week     No current facility-administered medications for this visit.    Family History  Problem Relation Age of Onset  . Colon cancer Father   .  Alzheimer's disease Father   . Emphysema Father   . COPD Father   . Breast cancer Maternal Aunt   . Heart attack Mother   . Cancer Brother     peritod gland cancer    ROS:  Pertinent items are noted in HPI.  Otherwise, a comprehensive ROS was negative.  Exam:   BP 112/66 mmHg  Pulse 68  Resp 16  Ht 5' 4.75" (1.645 m)  Wt 172 lb (78.019 kg)  BMI 28.83 kg/m2  LMP 08/01/2007 Height: 5' 4.75" (164.5 cm)  Ht Readings from Last 3 Encounters:  07/27/14 5' 4.75" (1.645 m)  07/17/14 5' 4.5" (1.638 m)  01/14/14 5\' 4"  (1.626 m)    General appearance: alert, cooperative and appears stated age Head: Normocephalic, without obvious abnormality, atraumatic Neck: no adenopathy, supple, symmetrical, trachea midline and thyroid normal to inspection and palpation Lungs: clear to auscultation bilaterally Breasts: normal appearance, no masses or tenderness Heart: regular rate and rhythm Abdomen: soft, non-tender; no masses,  no organomegaly Extremities: extremities normal, atraumatic, no cyanosis or edema Skin: Skin color, texture, turgor normal. No rashes or lesions Lymph nodes: Cervical, supraclavicular, and axillary nodes normal. No abnormal inguinal nodes palpated Neurologic: Grossly normal   Pelvic: External genitalia:  no lesions              Urethra:  normal appearing urethra with no masses, tenderness or lesions              Bartholin's and Skene's: normal                 Vagina: normal appearing vagina with normal color and discharge, no lesions              Cervix: anteverted              Pap taken: No. 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 Ralene Ok at [facility] monitor yearly   [place] breast cancer in 2 aunts  and colon cancer in father   HRT from 12/2008 and ended in April 2012  History of Vit D deficiency  P:   Reviewed health and wellness pertinent to exam  Pap smear not taken today  Mammogram is due 07/2015 - will get copy of this one from [place]  Will get Vit D level and follow  Counseled on breast self exam, mammography screening, adequate intake of calcium and vitamin D, diet and exercise, Kegel's exercises return annually or prn  An After Visit Summary was printed and given to the patient.

## 2014-07-28 LAB — VITAMIN D 25 HYDROXY (VIT D DEFICIENCY, FRACTURES): VIT D 25 HYDROXY: 22 ng/mL — AB (ref 30–100)

## 2014-07-29 ENCOUNTER — Telehealth: Payer: Self-pay | Admitting: *Deleted

## 2014-07-29 ENCOUNTER — Encounter: Payer: Self-pay | Admitting: Nurse Practitioner

## 2014-07-29 NOTE — Progress Notes (Signed)
Encounter reviewed by Dr. Brook Silva.  

## 2014-07-29 NOTE — Telephone Encounter (Signed)
I have attempted to contact this patient by phone with the following results: left message to return call to [place] at [phone] on answering machine (mobile per [facility]).  No personal information given.  [phone] (Mobile) *Preferred*

## 2014-07-29 NOTE — Telephone Encounter (Signed)
-----   Message from Erika Bolton, [employer] sent at 07/28/2014  8:43 AM EST ----- Let patient know that Vit D level is lower than last year, suggest she take OTC Vit D 1000 IU daily.

## 2014-07-31 DIAGNOSIS — N200 Calculus of kidney: Secondary | ICD-10-CM

## 2014-07-31 HISTORY — DX: Calculus of kidney: N20.0

## 2014-08-24 ENCOUNTER — Ambulatory Visit (INDEPENDENT_AMBULATORY_CARE_PROVIDER_SITE_OTHER): Payer: 59 | Admitting: Neurology

## 2014-08-24 DIAGNOSIS — R351 Nocturia: Secondary | ICD-10-CM

## 2014-08-24 DIAGNOSIS — G4734 Idiopathic sleep related nonobstructive alveolar hypoventilation: Secondary | ICD-10-CM

## 2014-08-24 DIAGNOSIS — G479 Sleep disorder, unspecified: Secondary | ICD-10-CM

## 2014-08-24 DIAGNOSIS — G4733 Obstructive sleep apnea (adult) (pediatric): Secondary | ICD-10-CM

## 2014-08-24 DIAGNOSIS — G478 Other sleep disorders: Secondary | ICD-10-CM

## 2014-08-24 DIAGNOSIS — G4720 Circadian rhythm sleep disorder, unspecified type: Secondary | ICD-10-CM

## 2014-08-24 DIAGNOSIS — R51 Headache: Secondary | ICD-10-CM

## 2014-08-25 NOTE — Sleep Study (Signed)
Please see the scanned sleep study interpretation located in the Media tab within the Chart Review section. °

## 2014-08-28 ENCOUNTER — Telehealth: Payer: Self-pay | Admitting: Neurology

## 2014-08-28 DIAGNOSIS — G4734 Idiopathic sleep related nonobstructive alveolar hypoventilation: Secondary | ICD-10-CM

## 2014-08-28 DIAGNOSIS — G4733 Obstructive sleep apnea (adult) (pediatric): Secondary | ICD-10-CM

## 2014-08-28 NOTE — Telephone Encounter (Signed)
Please call and notify the patient that the recent sleep study did confirm the diagnosis of obstructive sleep apnea and that I recommend treatment for this in the form of CPAP. This will require a repeat sleep study for proper titration and mask fitting. Please explain to patient and arrange for a CPAP titration study. I have placed an order in the chart. Thanks, Creta Dorame, MD, PhD [facility] ([facility])  

## 2014-08-31 ENCOUNTER — Encounter: Payer: Self-pay | Admitting: Neurology

## 2014-08-31 NOTE — Telephone Encounter (Signed)
Patient was contacted and provided the results of her sleep study which did reveal obstructive sleep apnea.  Patient was advised that a subsequent CPAP titration study had been recommended by Dr. Rexene Alberts for treatment.  Patient was explained in depth the reason why CPAP therapy was needed.  Patient was in agreement and her CPAP titration study was scheduled for Sunday Feb. 28th at 09:30 pm.  Ellsworth Lennox was faxed a copy of the results.

## 2014-09-23 ENCOUNTER — Emergency Department (HOSPITAL_COMMUNITY): Payer: 59

## 2014-09-23 ENCOUNTER — Emergency Department (HOSPITAL_COMMUNITY)
Admission: EM | Admit: 2014-09-23 | Discharge: 2014-09-23 | Disposition: A | Discharge status: Home / Self Care | Payer: 59 | Attending: Emergency Medicine | Admitting: Emergency Medicine

## 2014-09-23 ENCOUNTER — Encounter (HOSPITAL_COMMUNITY): Payer: Self-pay | Admitting: Emergency Medicine

## 2014-09-23 DIAGNOSIS — Z8742 Personal history of other diseases of the female genital tract: Secondary | ICD-10-CM | POA: Insufficient documentation

## 2014-09-23 DIAGNOSIS — R109 Unspecified abdominal pain: Secondary | ICD-10-CM

## 2014-09-23 DIAGNOSIS — N200 Calculus of kidney: Secondary | ICD-10-CM | POA: Diagnosis not present

## 2014-09-23 DIAGNOSIS — Z86010 Personal history of colonic polyps: Secondary | ICD-10-CM | POA: Insufficient documentation

## 2014-09-23 DIAGNOSIS — R1012 Left upper quadrant pain: Secondary | ICD-10-CM | POA: Diagnosis present

## 2014-09-23 DIAGNOSIS — Z79899 Other long term (current) drug therapy: Secondary | ICD-10-CM | POA: Diagnosis not present

## 2014-09-23 DIAGNOSIS — Z8679 Personal history of other diseases of the circulatory system: Secondary | ICD-10-CM | POA: Diagnosis not present

## 2014-09-23 DIAGNOSIS — Z862 Personal history of diseases of the blood and blood-forming organs and certain disorders involving the immune mechanism: Secondary | ICD-10-CM | POA: Diagnosis not present

## 2014-09-23 DIAGNOSIS — Z7982 Long term (current) use of aspirin: Secondary | ICD-10-CM | POA: Insufficient documentation

## 2014-09-23 DIAGNOSIS — Z8739 Personal history of other diseases of the musculoskeletal system and connective tissue: Secondary | ICD-10-CM | POA: Insufficient documentation

## 2014-09-23 DIAGNOSIS — Z880 Allergy status to penicillin: Secondary | ICD-10-CM | POA: Diagnosis not present

## 2014-09-23 DIAGNOSIS — Z859 Personal history of malignant neoplasm, unspecified: Secondary | ICD-10-CM | POA: Diagnosis not present

## 2014-09-23 DIAGNOSIS — R630 Anorexia: Secondary | ICD-10-CM | POA: Diagnosis not present

## 2014-09-23 LAB — URINALYSIS, ROUTINE W REFLEX MICROSCOPIC
Bilirubin Urine: NEGATIVE
Glucose, UA: NEGATIVE mg/dL
KETONES UR: NEGATIVE mg/dL
Leukocytes, UA: NEGATIVE
NITRITE: NEGATIVE
Protein, ur: NEGATIVE mg/dL
Specific Gravity, Urine: 1.02 (ref 1.005–1.030)
UROBILINOGEN UA: 1 mg/dL (ref 0.0–1.0)
pH: 7 (ref 5.0–8.0)

## 2014-09-23 LAB — CBC WITH DIFFERENTIAL/PLATELET
BASOS ABS: 0 10*3/uL (ref 0.0–0.1)
Basophils Relative: 0 % (ref 0–1)
Eosinophils Absolute: 0.1 10*3/uL (ref 0.0–0.7)
Eosinophils Relative: 1 % (ref 0–5)
HEMATOCRIT: 50.9 % — AB (ref 36.0–46.0)
Hemoglobin: 16.9 g/dL — ABNORMAL HIGH (ref 12.0–15.0)
LYMPHS ABS: 0.9 10*3/uL (ref 0.7–4.0)
LYMPHS PCT: 11 % — AB (ref 12–46)
MCH: 29.7 pg (ref 26.0–34.0)
MCHC: 33.2 g/dL (ref 30.0–36.0)
MCV: 89.5 fL (ref 78.0–100.0)
MONO ABS: 0.3 10*3/uL (ref 0.1–1.0)
MONOS PCT: 4 % (ref 3–12)
NEUTROS ABS: 6.5 10*3/uL (ref 1.7–7.7)
Neutrophils Relative %: 84 % — ABNORMAL HIGH (ref 43–77)
PLATELETS: 187 10*3/uL (ref 150–400)
RBC: 5.69 MIL/uL — AB (ref 3.87–5.11)
RDW: 12.8 % (ref 11.5–15.5)
WBC: 7.9 10*3/uL (ref 4.0–10.5)

## 2014-09-23 LAB — COMPREHENSIVE METABOLIC PANEL
ALBUMIN: 4.6 g/dL (ref 3.5–5.2)
ALT: 70 U/L — AB (ref 0–35)
AST: 50 U/L — ABNORMAL HIGH (ref 0–37)
Alkaline Phosphatase: 81 U/L (ref 39–117)
Anion gap: 11 (ref 5–15)
BUN: 19 mg/dL (ref 6–23)
CHLORIDE: 106 mmol/L (ref 96–112)
CO2: 23 mmol/L (ref 19–32)
Calcium: 9.8 mg/dL (ref 8.4–10.5)
Creatinine, Ser: 0.78 mg/dL (ref 0.50–1.10)
GFR calc Af Amer: >90 mL/min (ref >90)
GFR calc non Af Amer: >90 mL/min (ref >90)
Glucose, Bld: 191 mg/dL — ABNORMAL HIGH (ref 70–99)
Potassium: 3.9 mmol/L (ref 3.5–5.1)
Sodium: 140 mmol/L (ref 135–145)
TOTAL PROTEIN: 8 g/dL (ref 6.0–8.3)
Total Bilirubin: 0.9 mg/dL (ref 0.3–1.2)

## 2014-09-23 LAB — LIPASE, BLOOD: Lipase: 28 U/L (ref 11–59)

## 2014-09-23 LAB — URINE MICROSCOPIC-ADD ON

## 2014-09-23 MED ORDER — OXYCODONE-ACETAMINOPHEN 5-325 MG PO TABS: 1.0000 | ORAL_TABLET | ORAL | Status: DC | PRN

## 2014-09-23 MED ORDER — ONDANSETRON HCL 4 MG/2ML IJ SOLN
4.0000 mg | Freq: Once | INTRAMUSCULAR | Status: AC
  Administered 2014-09-23: 4 mg via INTRAVENOUS
  Filled 2014-09-23: qty 2

## 2014-09-23 MED ORDER — HYDROMORPHONE HCL 1 MG/ML IJ SOLN
1.0000 mg | Freq: Once | INTRAMUSCULAR | Status: AC
  Administered 2014-09-23: 1 mg via INTRAVENOUS
  Filled 2014-09-23: qty 1

## 2014-09-23 MED ORDER — KETOROLAC TROMETHAMINE 30 MG/ML IJ SOLN
30.0000 mg | Freq: Once | INTRAMUSCULAR | Status: DC
  Filled 2014-09-23: qty 1

## 2014-09-23 MED ORDER — ONDANSETRON 4 MG PO TBDP: ORAL_TABLET | ORAL | Status: DC

## 2014-09-23 MED ORDER — IOHEXOL 300 MG/ML  SOLN
100.0000 mL | Freq: Once | INTRAMUSCULAR | Status: AC | PRN
  Administered 2014-09-23: 100 mL via INTRAVENOUS

## 2014-09-23 MED ORDER — SODIUM CHLORIDE 0.9 % IV BOLUS (SEPSIS)
1000.0000 mL | Freq: Once | INTRAVENOUS | Status: AC
  Administered 2014-09-23: 1000 mL via INTRAVENOUS

## 2014-09-23 NOTE — ED Notes (Addendum)
Pt from home c/o acute onset left sided and lower abdominal pain/cramping accompanied by nausea and vomiting since this am. She reports 4 episodes of vomiting.

## 2014-09-23 NOTE — ED Notes (Signed)
pts son took pt to restroom pt didn't not obtain urine specimen. Pt reminded the need for urine specimen and specimen cup provided.

## 2014-09-23 NOTE — Discharge Instructions (Signed)

## 2014-09-23 NOTE — ED Provider Notes (Signed)
CSN: [id number]     Arrival date & time 09/23/14  0805 History   First MD Initiated Contact with Patient 09/23/14 [phone]     Chief Complaint  Patient presents with  . Abdominal Pain  . Nausea     (Consider location/radiation/quality/duration/timing/severity/associated sxs/prior Treatment) Patient is a 59 y.o. female presenting with abdominal pain.  Abdominal Pain Pain location:  LUQ and LLQ Pain quality: sharp   Pain radiates to:  Does not radiate Pain severity:  Moderate Onset quality:  Gradual Duration:  2 hours Timing:  Constant Progression:  Worsening Chronicity:  New Context: not alcohol use   Relieved by:  Nothing Worsened by:  Nothing tried Associated symptoms: anorexia, diarrhea, nausea and vomiting   Associated symptoms: no chest pain, no cough, no dysuria and no fever     Past Medical History  Diagnosis Date  . Colon polyps 2003  . Precancerous lesion 1987    mole excised from vulva  . Menorrhagia   . Left bundle branch block 2012  . Plantar fasciitis   . MGUS (monoclonal gammopathy of unknown significance)    Past Surgical History  Procedure Laterality Date  . Ablation  11/10/02    Hysteroscopic Thermal  . Other surgical history      Sebaceous cyst on head removed 7x's  . Laparoscopic cholecystectomy  11/2001  . Cyst on scalp      sebaccous(multiple times as a teen)  . Left leg muscle surgery  2012  . Gastro surgery  2012   Family History  Problem Relation Age of Onset  . Colon cancer Father   . Alzheimer's disease Father   . Emphysema Father   . COPD Father   . Breast cancer Maternal Aunt   . Heart attack Mother   . Cancer Brother     peritod gland cancer   History  Substance Use Topics  . Smoking status: Never Smoker   . Smokeless tobacco: Never Used  . Alcohol Use: 3.0 - 4.2 oz/week    5-7 Glasses of wine per week   OB History    Gravida Para Term Preterm AB TAB SAB Ectopic Multiple Living   2 2 2       2      Review of Systems   Constitutional: Negative for fever.  Respiratory: Negative for cough.   Cardiovascular: Negative for chest pain.  Gastrointestinal: Positive for nausea, vomiting, abdominal pain, diarrhea and anorexia.  Genitourinary: Negative for dysuria.  All other systems reviewed and are negative.     Allergies  Penicillins  Home Medications   Prior to Admission medications   Medication Sig Start Date End Date Taking? Authorizing Provider  aspirin 81 MG tablet Take 81 mg by mouth daily.   Yes Historical Provider, MD  carboxymethylcellulose (REFRESH PLUS) 0.5 % SOLN Place 1 drop into both eyes daily.   Yes Historical Provider, MD  Ivermectin (SOOLANTRA) 1 % CREA Apply 1 application topically every morning. Once daily to face   Yes Historical Provider, MD  omeprazole (PRILOSEC) 40 MG capsule Take 40 mg by mouth daily.   Yes Historical Provider, MD  sodium chloride (OCEAN) 0.65 % SOLN nasal spray Place 1 spray into both nostrils daily as needed for congestion.   Yes Historical Provider, MD  tretinoin microspheres (RETIN-A MICRO) 0.04 % gel Apply topically every other day. Uses twice a week   Yes Historical Provider, MD  ondansetron (ZOFRAN ODT) 4 MG disintegrating tablet 4mg  ODT q4 hours prn nausea/vomit 09/23/14  Debby Freiberg, MD  oxyCODONE-acetaminophen (PERCOCET/ROXICET) 5-325 MG per tablet Take 1-2 tablets by mouth every 4 (four) hours as needed for severe pain. 09/23/14   Debby Freiberg, MD   BP 152/84 mmHg  Pulse 84  Temp(Src) 97.6 F (36.4 C) (Oral)  Resp 20  SpO2 100%  LMP 08/01/2007 Physical Exam  Constitutional: She is oriented to person, place, and time. She appears well-developed and well-nourished.  HENT:  Head: Normocephalic and atraumatic.  Right Ear: External ear normal.  Left Ear: External ear normal.  Eyes: Conjunctivae and EOM are normal. Pupils are equal, round, and reactive to light.  Neck: Normal range of motion. Neck supple.  Cardiovascular: Normal rate, regular  rhythm, normal heart sounds and intact distal pulses.   Pulmonary/Chest: Effort normal and breath sounds normal.  Abdominal: Soft. Bowel sounds are normal. There is tenderness in the left upper quadrant and left lower quadrant.  Musculoskeletal: Normal range of motion.  Neurological: She is alert and oriented to person, place, and time.  Skin: Skin is warm and dry.  Vitals reviewed.   ED Course  Procedures (including critical care time) Labs Review Labs Reviewed  COMPREHENSIVE METABOLIC PANEL - Abnormal; Notable for the following:    Glucose, Bld 191 (*)    AST 50 (*)    ALT 70 (*)    All other components within normal limits  CBC WITH DIFFERENTIAL/PLATELET - Abnormal; Notable for the following:    RBC 5.69 (*)    Hemoglobin 16.9 (*)    HCT 50.9 (*)    Neutrophils Relative % 84 (*)    Lymphocytes Relative 11 (*)    All other components within normal limits  URINALYSIS, ROUTINE W REFLEX MICROSCOPIC - Abnormal; Notable for the following:    APPearance CLOUDY (*)    Hgb urine dipstick LARGE (*)    All other components within normal limits  LIPASE, BLOOD  URINE MICROSCOPIC-ADD ON    Imaging Review Ct Abdomen Pelvis W Contrast  09/23/2014   CLINICAL DATA:  Left lower quadrant pain radiating to left flank region for 1 day. Nausea and vomiting.  EXAM: CT ABDOMEN AND PELVIS WITH CONTRAST  TECHNIQUE: Multidetector CT imaging of the abdomen and pelvis was performed using the standard protocol following bolus administration of intravenous contrast.  CONTRAST:  176mL OMNIPAQUE IOHEXOL 300 MG/ML  SOLN  COMPARISON:  None.  FINDINGS: There is dependent atelectasis in each lung base posteriorly. On axial slice 4 series 6, there is a 4 mm nodular opacity in the anterior segment of the left lower lobe. There is no lung base edema or consolidation.  Liver is prominent, measuring 19.9 cm in length. There is hepatic steatosis. No focal liver lesions are identified. Gallbladder is absent. There is no  appreciable biliary duct dilatation.  Spleen, pancreas, and adrenals appear normal.  There is a 1.6 x 1.3 cm cyst arising from the mid right kidney laterally. There is a 1.0 x 1.0 cm mass in the posterior mid right kidney which has attenuation values higher than is expected with a cyst. There is a right lower pole cyst measuring 1.6 x 1.1 cm. There is no calculus or hydronephrosis on the right. There is no ureteral calculus on the right.  On the left, there is a cyst measuring 1.0 x 0.9 cm in the medial midportion of the kidney. There is moderate hydronephrosis on the left. There is no intrarenal calculus on the left. There is a 3 mm calculus at the left ureterovesical junction. No  other ureteral calculi seen.  In the pelvis, the urinary bladder is midline with normal wall thickness. There is a 1.9 x 1.2 cm cyst in the right ovary. Beyond this small cysts, there is no pelvic mass or pelvic fluid collection. The appendix appears normal. There are sigmoid diverticula without diverticulitis. There are no blastic or lytic bone lesions.  There is no bowel obstruction. No free air or portal venous air. There is no appreciable ascites, adenopathy, or abscess in the abdomen or pelvis. There is no appreciable abdominal aortic aneurysm.  IMPRESSION: 3 mm calculus left ureterovesical junction with moderate hydronephrosis in ureterectasis on the left.  Cysts are noted in each kidney. Note that there is a 1 cm mass in the mid right kidney which cannot be classified as a simple cyst. Further evaluation with nonemergent pre and post contrast MRI should be considered. Pre and post contrast CT could alternatively be performed, but would likely be of decreased accuracy given lesion size.  Prominent liver with hepatic steatosis.  Gallbladder absent.  Small cyst right ovary. This finding may warrant pelvic ultrasound in 1 year to confirm resolution or stability.  4 mm nodular opacity anterior segment left lower lobe. Followup of this  nodular opacity should be based on Fleischner Society guidelines. If the patient is at high risk for bronchogenic carcinoma, follow-up chest CT at 1 year is recommended. If the patient is at low risk, no follow-up is needed. This recommendation follows the consensus statement: Guidelines for Management of Small Pulmonary Nodules Detected on CT Scans: A Statement from the [place] as published in Radiology 2005; 237:395-400.  No bowel obstruction. No abscess. Appendix appears normal. There are sigmoid diverticula without diverticulitis.   Electronically Signed   By: Lowella Grip III M.D.   On: 09/23/2014 10:28     EKG Interpretation None      MDM   Final diagnoses:  Abdominal pain, acute  Nephrolithiasis    59 y.o. female without pertinent PMH presents with abd pain, nausea/vomiting/diarrhea.  Physical exam as above.   Wu as above with nephrolithiasis, no signs of infection.  Pain controlled in dept with dilaudid.  Standard return precautions given.  DC home to fu with urology. I have reviewed all laboratory and imaging studies if ordered as above  1. Nephrolithiasis   2. Abdominal pain, acute         Debby Freiberg, MD 09/23/14 1114

## 2014-09-27 ENCOUNTER — Ambulatory Visit (INDEPENDENT_AMBULATORY_CARE_PROVIDER_SITE_OTHER): Payer: 59 | Admitting: Neurology

## 2014-09-27 VITALS — BP 151/85 | HR 91 | Resp 14

## 2014-09-27 DIAGNOSIS — G4761 Periodic limb movement disorder: Secondary | ICD-10-CM

## 2014-09-27 DIAGNOSIS — G4733 Obstructive sleep apnea (adult) (pediatric): Secondary | ICD-10-CM | POA: Diagnosis not present

## 2014-09-27 NOTE — Sleep Study (Signed)
Please see the scanned sleep study interpretation located in the Procedure tab within the Chart Review section. 

## 2014-10-09 ENCOUNTER — Telehealth: Payer: Self-pay | Admitting: Neurology

## 2014-10-09 DIAGNOSIS — G4733 Obstructive sleep apnea (adult) (pediatric): Secondary | ICD-10-CM

## 2014-10-09 NOTE — Telephone Encounter (Signed)
Pt notified in result note.  Closing encounter. 

## 2014-10-09 NOTE — Telephone Encounter (Signed)
Please call and inform patient that I have entered an order for treatment with PAP. She did well during the latest sleep study with CPAP. We will, therefore, arrange for a machine for home use through a DME (durable medical equipment) company of Her choice; and I will see the patient back in follow-up in about 6 weeks. Please also explain to the patient that I will be looking out for compliance data downloaded from the machine, which can be done remotely through a modem at times or stored on an SD card in the back of the machine. At the time of the followup appointment we will discuss sleep study results and how it is going with PAP treatment at home. Please advise patient to bring Her machine at the time of the visit; at least for the first visit, even though this is cumbersome. Bringing the machine for every visit after that may not be needed, but often helps for the first visit. Please also make sure, the patient has a follow-up appointment with me in about 6 weeks from the setup date, thanks.   Stevin Bielinski, MD, PhD [facility] ([facility])  

## 2014-10-12 ENCOUNTER — Encounter: Payer: Self-pay | Admitting: *Deleted

## 2014-10-12 ENCOUNTER — Encounter: Payer: Self-pay | Admitting: Neurology

## 2014-10-12 NOTE — Telephone Encounter (Signed)
Patient was provided the results of her CPAP titration study that was considered effective in treatment.  Patient was advised that CPAP therapy was recommended for home use.  Patient was referred to [facility] for CPAP set up.  Reita Cliche was faxed a copy of the results.   Patient instructed to contact our office 6-8 weeks post set up to schedule a follow up appointment.  The patient gave verbal permission to mail a copy of her test results.

## 2014-10-30 ENCOUNTER — Telehealth: Payer: Self-pay | Admitting: Oncology

## 2014-10-30 NOTE — Telephone Encounter (Signed)
Mailed pt medical records to [facility]

## 2014-12-01 ENCOUNTER — Encounter: Payer: Self-pay | Admitting: Neurology

## 2014-12-01 ENCOUNTER — Ambulatory Visit (INDEPENDENT_AMBULATORY_CARE_PROVIDER_SITE_OTHER): Payer: 59 | Admitting: Neurology

## 2014-12-01 VITALS — BP 119/72 | HR 81 | Resp 16 | Ht 64.5 in | Wt 172.0 lb

## 2014-12-01 DIAGNOSIS — G4733 Obstructive sleep apnea (adult) (pediatric): Secondary | ICD-10-CM | POA: Diagnosis not present

## 2014-12-01 DIAGNOSIS — E663 Overweight: Secondary | ICD-10-CM | POA: Diagnosis not present

## 2014-12-01 DIAGNOSIS — G4700 Insomnia, unspecified: Secondary | ICD-10-CM

## 2014-12-01 DIAGNOSIS — G4730 Sleep apnea, unspecified: Secondary | ICD-10-CM

## 2014-12-01 DIAGNOSIS — Z9989 Dependence on other enabling machines and devices: Secondary | ICD-10-CM

## 2014-12-01 NOTE — Progress Notes (Signed)
Subjective:    Patient ID: Erika Bolton is a 59 y.o. female.  HPI     Interim history:  Erika Bolton is a 59 year old right-handed woman with an underlying medical history of colonic polyps, left bundle branch block, plantar fasciatis s/p surgery 3 years ago, MGUS, and overweight state, who presents for follow-up consultation of her obstructive sleep apnea, now on treatment with CPAP. The patient is unaccompanied today. I first met her on 07/17/2014 at the request of her primary care provider, at which time the patient reported snoring and witnessed apneas while asleep, as well as morning headaches and nocturia. I invited her back for sleep study. She had a baseline sleep study on 08/24/2014 as well as a CPAP titration study on 09/27/2014. I went over her test results with her in detail today. Her baseline sleep study from 08/24/2014 showed a sleep efficiency of 78.8% with a normal sleep latency and wake after sleep onset of 72 minutes with moderate sleep fragmentation noted. She had a mildly increased percentage of stage I and stage II sleep, and a reduced percentage of REM sleep with a normal REM latency. She had no significant PLMS. She had mild to moderate and at times louder snoring. She slept mostly on her sides. Her total AHI was 10.4 per hour, rising to 33 per hour during REM sleep. Average oxygen saturation was 94%, nadir was 75%. Based on her test results I invited her back for CPAP titration overnight. Her study was on 09/27/2014. She had a sleep efficiency of 72.4% with a prolonged sleep latency and wake after sleep onset of 83 minutes with overall mild sleep fragmentation noted. She had a normal arousal index. She had a normal percentage of stage I and stage II sleep. She had normal deep sleep, and a normal percentage of REM sleep with a normal REM latency. She had borderline periodic leg movements at 5.7 per hour, with no significant arousals. She was titrated on CPAP from 5-6 cm. Her AHI was 0  per hour on a pressure of 5 cm. Supine REM sleep was not achieved. Her average oxygen saturation was 95%, nadir was 92%. Based on the test results are prescribed CPAP therapy for home use.  Today, 12/01/2014: I reviewed her CPAP compliance data from 10/31/2014 through 11/29/2014 which is a total of 30 days during which time she used her machine every night with percent used days greater than 4 hours at 90%, indicating excellent compliance with an average usage of 6 hours and 47 minutes, residual AHI low at 0.3 per hour, leaked low with the 95th percentile at 1.2 L/m on a pressure of 5 cm with EPR of 1.  Today, 12/01/2014: she reports that she is still adjusting to treatment. She has difficulty especially in the beginning but she is committed to trying and compliant with treatment. She may be able to sleep a little bit more consolidated but has started having difficulty falling asleep. Years ago she tried trazodone for difficulty staying asleep but had side effects with it. She has tried Benadryl over-the-counter which has really not helped very much. She would not like to be dependent on a sleeping pill to be able to use CPAP. She has noticed that she does not get up to use the bathroom as much. Overall she is willing to continue to try using it. She is working on weight loss and is wondering if she can come off of CPAP machine if she loses 20-25 pounds. Her blood pressure  looks good today.  Previously:   She reports snoring and witnessed apneas, per husband. This has been ongoing for about 6 months. She has had morning HAs, and has nocturia 1-2 per night. She has sleep disruption. She denies a FHx of OSA, and has no RLS Sx and is not known to kick in her sleep. Her bedtime is 10 PM and she falls asleep quickly. She typically wakes up in the early morning hours around 3 AM and between 3 AM and wake time of 6 AM she goes in and out of light sleep. She does not wake up rested and feels groggy first thing in  the morning. She does not watch TV in bed. She drinks 1-1-1/2 cups of coffee per day. She drinks 1-1-1/2 glasses of wine in the evenings. She does not smoke. 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 She does not nap. Her Epworth sleepiness score is 5 out of 24 today.    Her Past Medical History Is Significant For: Past Medical History  Diagnosis Date  . Colon polyps 2003  . Precancerous lesion 1987    mole excised from vulva  . Menorrhagia   . Left bundle branch block 2012  . Plantar fasciitis   . MGUS (monoclonal gammopathy of unknown significance)     Her Past Surgical History Is Significant For: Past Surgical History  Procedure Laterality Date  . Ablation  11/10/02    Hysteroscopic Thermal  . Other surgical history      Sebaceous cyst on head removed 7x's  . Laparoscopic cholecystectomy  11/2001  . Cyst on scalp      sebaccous(multiple times as a teen)  . Left leg muscle surgery  2012  . Gastro surgery  2012    Her Family History Is Significant For: Family History  Problem Relation Age of Onset  . Colon cancer Father   . Alzheimer's disease Father   . Emphysema Father   . COPD Father   . Breast cancer Maternal Aunt   . Heart attack Mother   . Cancer Brother     peritod gland cancer    Her Social History Is Significant For: History   Social History  . Marital Status: Married    Spouse Name: N/A  . Number of Children: 2  . Years of Education: M.ED.   Occupational History  .      [place] History Main Topics  . Smoking status: Never Smoker   . Smokeless tobacco: Never Used  . Alcohol Use: 3.0 - 4.2 oz/week    5-7 Glasses of wine per week  . Drug Use: No  . Sexual Activity:    Partners: Male    Birth Control/ Protection: Post-menopausal   Other Topics Concern  . None   Social History Narrative   2 cups of coffee a day     Her Allergies Are:  Allergies  Allergen Reactions  .  Penicillins Swelling    Throat swelling  :   Her Current Medications Are:   Outpatient Prescriptions Prior to Visit  Medication Sig Dispense Refill  . aspirin 81 MG tablet Take 81 mg by mouth daily.    . carboxymethylcellulose (REFRESH PLUS) 0.5 % SOLN Place 1 drop into both eyes daily.    . sodium chloride (OCEAN) 0.65 % SOLN nasal spray Place 1 spray into both nostrils daily as needed for congestion.    Marland Kitchen tretinoin microspheres (RETIN-A MICRO) 0.04 %  gel Apply topically every other day. Uses twice a week    . Ivermectin (SOOLANTRA) 1 % CREA Apply 1 application topically every morning. Once daily to face    . omeprazole (PRILOSEC) 40 MG capsule Take 40 mg by mouth daily.    . ondansetron (ZOFRAN ODT) 4 MG disintegrating tablet 41m ODT q4 hours prn nausea/vomit 20 tablet 0  . oxyCODONE-acetaminophen (PERCOCET/ROXICET) 5-325 MG per tablet Take 1-2 tablets by mouth every 4 (four) hours as needed for severe pain. 20 tablet 0   No facility-administered medications prior to visit.   Review of Systems:  Out of a complete 14 point review of systems, all are reviewed and negative with the exception of these symptoms as listed below:   Review of Systems  All other systems reviewed and are negative.   Objective:  Neurologic Exam  Physical Exam Physical Examination:   Filed Vitals:   12/01/14 0828  BP: 119/72  Pulse: 81  Resp: 16    General Examination: The patient is a very pleasant 59y.o. female in no acute distress. She appears well-developed and well-nourished and well groomed.   HEENT: Normocephalic, atraumatic, pupils are equal, round and reactive to light and accommodation. Funduscopic exam is normal with sharp disc margins noted. Extraocular tracking is good without limitation to gaze excursion or nystagmus noted. Normal smooth pursuit is noted. Hearing is grossly intact. Face is symmetric with normal facial animation and normal facial sensation. Speech is clear with no  dysarthria noted. There is no hypophonia. There is no lip, neck/head, jaw or voice tremor. Neck is supple with full range of passive and active motion. There are no carotid bruits on auscultation. Oropharynx exam reveals: mild mouth dryness, good dental hygiene and mild to moderate airway crowding, due to narrow airway entry, mildly enlarged uvula, slightly longer tongue and tonsils in place of 1+ in size bilaterally. Mallampati is class II. Tongue protrudes centrally and palate elevates symmetrically. She has a very tiny overbite.  Chest: Clear to auscultation without wheezing, rhonchi or crackles noted.  Heart: S1+S2+0, regular and normal without murmurs, rubs or gallops noted.   Abdomen: Soft, non-tender and non-distended with normal bowel sounds appreciated on auscultation.  Extremities: There is no pitting edema in the distal lower extremities bilaterally. Pedal pulses are intact.  Skin: Warm and dry without trophic changes noted. There are no varicose veins.  Musculoskeletal: exam reveals no obvious joint deformities, tenderness or joint swelling or erythema.   Neurologically:  Mental status: The patient is awake, alert and oriented in all 4 spheres. Her immediate and remote memory, attention, language skills and fund of knowledge are appropriate. There is no evidence of aphasia, agnosia, apraxia or anomia. Speech is clear with normal prosody and enunciation. Thought process is linear. Mood is normal and affect is normal.  Cranial nerves II - XII are as described above under HEENT exam. In addition: shoulder shrug is normal with equal shoulder height noted. Motor exam: Normal bulk, strength and tone is noted. There is no drift, tremor or rebound. Romberg is negative. Reflexes are 2+ throughout. Babinski: Toes are flexor bilaterally. Fine motor skills and coordination: intact with normal finger taps, normal hand movements, normal rapid alternating patting, normal foot taps and normal foot  agility.  Cerebellar testing: No dysmetria or intention tremor on finger to nose testing. Heel to shin is unremarkable bilaterally. There is no truncal or gait ataxia.  Sensory exam: intact to light touch, vibration, temperature sense in the upper and lower  extremities.  Gait, station and balance: She stands easily. No veering to one side is noted. No leaning to one side is noted. Posture is age-appropriate and stance is narrow based. Gait shows normal stride length and normal pace. No problems turning are noted. She turns en bloc. Tandem walk is unremarkable.  Assessment and Plan:   In summary, Erika Bolton is a very pleasant 59 year old female with an underlying medical history of colonic polyps, left bundle branch block, plantar fasciatis s/p surgery 3 years ago, MGUS, and overweight state, who presents for follow-up consultation of her obstructive sleep apnea, now on treatment with CPAP at 5 cm water pressure. While she does not have a day and night difference in her sleep she does notice smaller changes are positive with treatment. She still struggling to fall asleep and is still adjusting to using CPAP regularly. She is commended on trying and she is also highly commended for being compliant with treatment. Her physical exam is stable and nonfocal. She is reassured in that regard. We talked about her to sleep study results in detail today as well as her CPAP compliance data and I explained the findings. She is encouraged to strive for weight loss. If she were able to lose about 20 pounds I think we can revisit the diagnosis and repeat her sleep study down the Road to see if she still needs to be on treatment. While she did have overall mild sleep apnea, she has particularly REM related sleep apnea with most of her breathing events during REM sleep and significant desaturations in rem sleep. Her nadir was 75%. At this juncture, I have asked her to continue treatment with CPAP and continue to work on  weight loss. I've asked her to try over-the-counter melatonin for sleep, 3-5 mg and even 10 mg if needed, 1-2 hours before her projected bedtime. I will see her back in 6 months. I again had a long chat with the patient about my findings and the diagnosis of OSA, its prognosis and treatment options. I explained in particular the risks and ramifications of untreated moderate to severe OSA, especially with respect to developing cardiovascular disease down the Road, including congestive heart failure, difficult to treat hypertension, cardiac arrhythmias, or stroke. Even type 2 diabetes has, in part, been linked to untreated OSA. Symptoms of untreated OSA include daytime sleepiness, memory problems, mood irritability and mood disorder such as depression and anxiety, lack of energy, as well as recurrent headaches, especially morning headaches.  I explained the importance of being compliant with PAP treatment, not only for insurance purposes but primarily to improve Her symptoms, and for the patient's long term health benefit, including to reduce Her cardiovascular risks. I answered all her questions today and the patient was in agreement. I encouraged her to call with any interim questions, concerns, problems or updates.  I spent 25 minutes in total face-to-face time with the patient, more than 50% of which was spent in counseling and coordination of care, reviewing test results, reviewing medication and discussing or reviewing the diagnosis of OSA, its prognosis and treatment options.

## 2014-12-01 NOTE — Patient Instructions (Signed)
Please continue using your CPAP regularly. While your insurance requires that you use CPAP at least 4 hours each night on 70% of the nights, I recommend, that you not skip any nights and use it throughout the night if you can. Getting used to CPAP and staying with the treatment long term does take time and patience and discipline. Untreated obstructive sleep apnea when it is moderate to severe can have an adverse impact on cardiovascular health and raise her risk for heart disease, arrhythmias, hypertension, congestive heart failure, stroke and diabetes. Untreated obstructive sleep apnea causes sleep disruption, nonrestorative sleep, and sleep deprivation. This can have an impact on your day to day functioning and cause daytime sleepiness and impairment of cognitive function, memory loss, mood disturbance, and problems focussing. Using CPAP regularly can improve these symptoms.  Keep up the good work! I will see you back in 6 months for sleep apnea check up, and if you are able to lose weight, we will consider re-checking your diagnosis with a repeat sleep study, perhaps in a year from now.    You can try Melatonin at night for sleep: take 3 to 5 mg one to 2 hours before your bedtime.

## 2014-12-07 ENCOUNTER — Telehealth: Payer: Self-pay | Admitting: Neurology

## 2014-12-07 NOTE — Telephone Encounter (Signed)
In speaking with the patient, this sounds like a humidity problem with the CPAP machine. She will call the DME company for further direction. If any further problems she will call us back.

## 2014-12-07 NOTE — Telephone Encounter (Signed)
Pt called office requesting to speak to someone about new symptoms she is experiencing while using cpap. Pt states she has been using the machine since March 2016. Pt states she has recently experienced nose bleeds and has lip swelling. Pt also has questions regarding the current pressure setting for her machine. Pt states that at a specific time of night she is awakened by a pressure rise, this has happened twice so far. Please contact pt and advise. Pt goes by the name Erika Bolton.

## 2014-12-09 ENCOUNTER — Telehealth: Payer: Self-pay | Admitting: Hematology and Oncology

## 2014-12-09 NOTE — Telephone Encounter (Signed)
Spoke with patient and she is aware of her new appointment with dr Alvy Bimler

## 2015-01-15 ENCOUNTER — Other Ambulatory Visit: Payer: Self-pay | Admitting: Urology

## 2015-01-15 DIAGNOSIS — N2889 Other specified disorders of kidney and ureter: Secondary | ICD-10-CM

## 2015-01-18 ENCOUNTER — Encounter: Payer: Self-pay | Admitting: Hematology and Oncology

## 2015-01-18 ENCOUNTER — Telehealth: Payer: Self-pay | Admitting: Hematology and Oncology

## 2015-01-18 ENCOUNTER — Other Ambulatory Visit: Payer: Self-pay | Admitting: Hematology and Oncology

## 2015-01-18 ENCOUNTER — Other Ambulatory Visit (HOSPITAL_BASED_OUTPATIENT_CLINIC_OR_DEPARTMENT_OTHER): Payer: 59

## 2015-01-18 ENCOUNTER — Ambulatory Visit (HOSPITAL_BASED_OUTPATIENT_CLINIC_OR_DEPARTMENT_OTHER): Payer: 59 | Admitting: Hematology and Oncology

## 2015-01-18 VITALS — BP 158/70 | HR 91 | Temp 97.0°F | Resp 18 | Ht 64.5 in | Wt 176.5 lb

## 2015-01-18 DIAGNOSIS — K760 Fatty (change of) liver, not elsewhere classified: Secondary | ICD-10-CM | POA: Diagnosis not present

## 2015-01-18 DIAGNOSIS — D472 Monoclonal gammopathy: Secondary | ICD-10-CM

## 2015-01-18 LAB — CBC WITH DIFFERENTIAL/PLATELET
BASO%: 1.5 % (ref 0.0–2.0)
Basophils Absolute: 0.1 10*3/uL (ref 0.0–0.1)
EOS%: 3.1 % (ref 0.0–7.0)
Eosinophils Absolute: 0.2 10*3/uL (ref 0.0–0.5)
HCT: 42.3 % (ref 34.8–46.6)
HGB: 14.1 g/dL (ref 11.6–15.9)
LYMPH%: 21.5 % (ref 14.0–49.7)
MCH: 30 pg (ref 25.1–34.0)
MCHC: 33.4 g/dL (ref 31.5–36.0)
MCV: 89.6 fL (ref 79.5–101.0)
MONO#: 0.5 10*3/uL (ref 0.1–0.9)
MONO%: 7.3 % (ref 0.0–14.0)
NEUT#: 4.3 10*3/uL (ref 1.5–6.5)
NEUT%: 66.6 % (ref 38.4–76.8)
Platelets: 253 10*3/uL (ref 145–400)
RBC: 4.72 10*6/uL (ref 3.70–5.45)
RDW: 13.4 % (ref 11.2–14.5)
WBC: 6.5 10*3/uL (ref 3.9–10.3)
lymph#: 1.4 10*3/uL (ref 0.9–3.3)

## 2015-01-18 LAB — COMPREHENSIVE METABOLIC PANEL (CC13)
ALBUMIN: 4.1 g/dL (ref 3.5–5.0)
ALT: 94 U/L — ABNORMAL HIGH (ref 0–55)
ANION GAP: 7 meq/L (ref 3–11)
AST: 61 U/L — ABNORMAL HIGH (ref 5–34)
Alkaline Phosphatase: 92 U/L (ref 40–150)
BILIRUBIN TOTAL: 0.4 mg/dL (ref 0.20–1.20)
BUN: 12.4 mg/dL (ref 7.0–26.0)
CHLORIDE: 110 meq/L — AB (ref 98–109)
CO2: 25 mEq/L (ref 22–29)
CREATININE: 0.7 mg/dL (ref 0.6–1.1)
Calcium: 10.3 mg/dL (ref 8.4–10.4)
Glucose: 95 mg/dl (ref 70–140)
Potassium: 3.9 mEq/L (ref 3.5–5.1)
Sodium: 142 mEq/L (ref 136–145)
Total Protein: 7.4 g/dL (ref 6.4–8.3)

## 2015-01-18 LAB — LACTATE DEHYDROGENASE (CC13): LDH: 163 U/L (ref 125–245)

## 2015-01-18 NOTE — Assessment & Plan Note (Signed)
Her recent CT scan done this year show fatty liver congestion with associated abnormal liver function tests. Recommend she refrain from drinking alcohol on a consistent basis. She is being followed closely by primary care doctor and GI service.

## 2015-01-18 NOTE — Progress Notes (Signed)
[place] FOLLOW-UP progress notes  Patient Care Team: Lavone Orn, MD as PCP - General (Internal Medicine)  CHIEF COMPLAINTS/PURPOSE OF VISIT:   IgG kappa MGUS  HISTORY OF PRESENTING ILLNESS:  Erika Bolton 59 y.o. female was transferred to my care after her prior physician has left.  I reviewed the patient's records extensive and collaborated the history with the patient. Summary of her history is as follows: This patient was discovered to have MGUS after she was found to have abnormal blood tests. She was observed. She denies atypical infection. Denies bone fractures or abnormal bone pain.  MEDICAL HISTORY:  Past Medical History  Diagnosis Date  . Colon polyps 2003  . Precancerous lesion 1987    mole excised from vulva  . Menorrhagia   . Left bundle branch block 2012  . Plantar fasciitis   . MGUS (monoclonal gammopathy of unknown significance)     SURGICAL HISTORY: Past Surgical History  Procedure Laterality Date  . Ablation  11/10/02    Hysteroscopic Thermal  . Other surgical history      Sebaceous cyst on head removed 7x's  . Laparoscopic cholecystectomy  11/2001  . Cyst on scalp      sebaccous(multiple times as a teen)  . Left leg muscle surgery  2012  . Gastro surgery  2012    SOCIAL HISTORY: History   Social History  . Marital Status: Married    Spouse Name: N/A  . Number of Children: 2  . Years of Education: M.ED.   Occupational History  .      [employer] History Main Topics  . Smoking status: Never Smoker   . Smokeless tobacco: Never Used  . Alcohol Use: 3.0 - 4.2 oz/week    5-7 Glasses of wine per week  . Drug Use: No  . Sexual Activity:    Partners: Male    Birth Control/ Protection: Post-menopausal   Other Topics Concern  . Not on file   Social History Narrative   2 cups of coffee a day     FAMILY HISTORY: Family History  Problem Relation Age of Onset  . Colon cancer Father   . Alzheimer's disease  Father   . Emphysema Father   . COPD Father   . Breast cancer Maternal Aunt   . Heart attack Mother   . Cancer Brother     peritod gland cancer    ALLERGIES:  is allergic to penicillins.  MEDICATIONS:  Current Outpatient Prescriptions  Medication Sig Dispense Refill  . aspirin 81 MG tablet Take 81 mg by mouth daily.    . carboxymethylcellulose (REFRESH PLUS) 0.5 % SOLN Place 1 drop into both eyes daily.    . cholecalciferol (VITAMIN D) 1000 UNITS tablet Take 1,000 Units by mouth daily.    . famotidine (PEPCID) 10 MG tablet Take 10 mg by mouth 2 (two) times daily.    . fluticasone (FLONASE) 50 MCG/ACT nasal spray Place 1 spray into both nostrils daily.    . Ivermectin 1 % CREA Apply topically.    . tretinoin microspheres (RETIN-A MICRO) 0.04 % gel Apply topically every other day. Uses twice a week    . sodium chloride (OCEAN) 0.65 % SOLN nasal spray Place 1 spray into both nostrils daily as needed for congestion.     No current facility-administered medications for this visit.    REVIEW OF SYSTEMS:   Constitutional: Denies fevers, chills or abnormal night sweats Eyes: Denies blurriness of  vision, double vision or watery eyes Ears, nose, mouth, throat, and face: Denies mucositis or sore throat Respiratory: Denies cough, dyspnea or wheezes Cardiovascular: Denies palpitation, chest discomfort or lower extremity swelling Gastrointestinal:  Denies nausea, heartburn or change in bowel habits Skin: Denies abnormal skin rashes Lymphatics: Denies new lymphadenopathy or easy bruising Neurological:Denies numbness, tingling or new weaknesses Behavioral/Psych: Mood is stable, no new changes  All other systems were reviewed with the patient and are negative.  PHYSICAL EXAMINATION: ECOG PERFORMANCE STATUS: 1 - Symptomatic but completely ambulatory  Filed Vitals:   01/18/15 1449  BP: 158/70  Pulse: 91  Temp: 97 F (36.1 C)  Resp: 18   Filed Weights   01/18/15 1449  Weight: 176 lb 8  oz (80.06 kg)    GENERAL:alert, no distress and comfortable SKIN: skin color, texture, turgor are normal, no rashes or significant lesions EYES: normal, conjunctiva are pink and non-injected, sclera clear OROPHARYNX:no exudate, normal lips, buccal mucosa, and tongue  NECK: supple, thyroid normal size, non-tender, without nodularity LYMPH:  no palpable lymphadenopathy in the cervical, axillary or inguinal LUNGS: clear to auscultation and percussion with normal breathing effort HEART: regular rate & rhythm and no murmurs without lower extremity edema ABDOMEN:abdomen soft, non-tender and normal bowel sounds Musculoskeletal:no cyanosis of digits and no clubbing  PSYCH: alert & oriented x 3 with fluent speech NEURO: no focal motor/sensory deficits  LABORATORY DATA:  I have reviewed the data as listed Lab Results  Component Value Date   WBC 6.5 01/18/2015   HGB 14.1 01/18/2015   HCT 42.3 01/18/2015   MCV 89.6 01/18/2015   PLT 253 01/18/2015    Recent Labs  09/23/14 0826  NA 140  K 3.9  CL 106  CO2 23  GLUCOSE 191*  BUN 19  CREATININE 0.78  CALCIUM 9.8  GFRNONAA >90  GFRAA >90  PROT 8.0  ALBUMIN 4.6  AST 50*  ALT 70*  ALKPHOS 81  BILITOT 0.9    RADIOGRAPHIC STUDIES: I reviewed her most recent CT scan report. I have personally reviewed the radiological images as listed and agreed with the findings in the report.  ASSESSMENT & PLAN:  MGUS (monoclonal gammopathy of unknown significance) Her blood work is pending. Clinically, she has no signs of disease progression. I will see her on a yearly basis with history, physical examination, blood work and imaging study to be done week ahead of time prior to her next year return visit. I emphasized the importance of vitamin D supplements and annual influenza vaccination.  Fatty infiltration of liver  Her recent CT scan done this year show fatty liver congestion with associated abnormal liver function tests. Recommend she  refrain from drinking alcohol on a consistent basis. She is being followed closely by primary care doctor and GI service.     Orders Placed This Encounter  Procedures  . DG Bone Survey Met    Standing Status: Future     Number of Occurrences:      Standing Expiration Date: 03/19/2016    Order Specific Question:  Reason for Exam (SYMPTOM  OR DIAGNOSIS REQUIRED)    Answer:  staging myeloma    Order Specific Question:  Is the patient pregnant?    Answer:  No    Order Specific Question:  Preferred imaging location?    Answer:  [facility]  . CBC with Differential/Platelet    Standing Status: Future     Number of Occurrences:      Standing Expiration Date:  03/19/2016  . Comprehensive metabolic panel    Standing Status: Future     Number of Occurrences:      Standing Expiration Date: 03/19/2016  . Lactate dehydrogenase    Standing Status: Future     Number of Occurrences:      Standing Expiration Date: 03/19/2016  . SPEP & IFE with QIG    Standing Status: Future     Number of Occurrences:      Standing Expiration Date: 03/19/2016  . Kappa/lambda light chains    Standing Status: Future     Number of Occurrences:      Standing Expiration Date: 03/19/2016  . Beta 2 microglobulin, serum    Standing Status: Future     Number of Occurrences:      Standing Expiration Date: 03/19/2016    All questions were answered. The patient knows to call the clinic with any problems, questions or concerns. I spent 15 minutes counseling the patient face to face. The total time spent in the appointment was 20 minutes and more than 50% was on counseling.     [facility], [place], MD 01/18/2015 3:23 PM

## 2015-01-18 NOTE — Assessment & Plan Note (Signed)
Her blood work is pending. Clinically, she has no signs of disease progression. I will see her on a yearly basis with history, physical examination, blood work and imaging study to be done week ahead of time prior to her next year return visit. I emphasized the importance of vitamin D supplements and annual influenza vaccination.

## 2015-01-18 NOTE — Telephone Encounter (Signed)
Gave and printed appt sched and avs for pt for June 2017 °

## 2015-01-19 ENCOUNTER — Ambulatory Visit: Payer: 59

## 2015-01-19 ENCOUNTER — Other Ambulatory Visit: Payer: 59

## 2015-01-20 LAB — SPEP & IFE WITH QIG
ALPHA-1-GLOBULIN: 0.3 g/dL (ref 0.2–0.3)
ALPHA-2-GLOBULIN: 0.7 g/dL (ref 0.5–0.9)
Abnormal Protein Band1: 0.8 g/dL
Albumin ELP: 4.4 g/dL (ref 3.8–4.8)
BETA GLOBULIN: 0.5 g/dL (ref 0.4–0.6)
Beta 2: 0.3 g/dL (ref 0.2–0.5)
GAMMA GLOBULIN: 1.2 g/dL (ref 0.8–1.7)
IGG (IMMUNOGLOBIN G), SERUM: 1360 mg/dL (ref 690–1700)
IgA: 50 mg/dL — ABNORMAL LOW (ref 69–380)
IgM, Serum: 29 mg/dL — ABNORMAL LOW (ref 52–322)
Total Protein, Serum Electrophoresis: 7.3 g/dL (ref 6.1–8.1)

## 2015-01-20 LAB — KAPPA/LAMBDA LIGHT CHAINS
KAPPA FREE LGHT CHN: 2.47 mg/dL — AB (ref 0.33–1.94)
Kappa:Lambda Ratio: 2.68 — ABNORMAL HIGH (ref 0.26–1.65)
LAMBDA FREE LGHT CHN: 0.92 mg/dL (ref 0.57–2.63)

## 2015-01-20 LAB — BETA 2 MICROGLOBULIN, SERUM: BETA 2 MICROGLOBULIN: 2.15 mg/L (ref <=2.51)

## 2015-01-26 ENCOUNTER — Telehealth: Payer: Self-pay | Admitting: *Deleted

## 2015-01-26 NOTE — Telephone Encounter (Signed)
-----   Message from Heath Lark, MD sent at 01/26/2015  9:51 AM EDT ----- Regarding: Test results Pls let her know MGUS tests are stable Incidentally found to have elevated liver tests; another doctor ordered CT scan this year which showed fatty liver change. Typical recommendation is weight loss or primary doctor for referral to see liver specialists Other tests are OK. I think she wants a copy of labs mailed to her

## 2015-01-26 NOTE — Telephone Encounter (Signed)
Informed pt of Dr. Calton Dach message below.  She verbalized understanding and states she will f/u w/ her GI doctor regarding elevated liver tests.  She asks for labs to be faxed to him and mailed to her.  I faxed labs and office note to Dr. Earlean Shawl at fax [phone] and placed copy of labs in mail to pt.Erika Bolton

## 2015-02-05 ENCOUNTER — Ambulatory Visit (HOSPITAL_COMMUNITY)
Admission: RE | Admit: 2015-02-05 | Discharge: 2015-02-05 | Disposition: A | Discharge status: Home / Self Care | Payer: 59 | Source: Ambulatory Visit | Attending: Urology | Admitting: Urology

## 2015-02-05 DIAGNOSIS — N2889 Other specified disorders of kidney and ureter: Secondary | ICD-10-CM | POA: Diagnosis not present

## 2015-02-05 DIAGNOSIS — N281 Cyst of kidney, acquired: Secondary | ICD-10-CM | POA: Insufficient documentation

## 2015-02-05 DIAGNOSIS — Z9049 Acquired absence of other specified parts of digestive tract: Secondary | ICD-10-CM | POA: Diagnosis not present

## 2015-02-05 MED ORDER — GADOBENATE DIMEGLUMINE 529 MG/ML IV SOLN
20.0000 mL | Freq: Once | INTRAVENOUS | Status: AC | PRN
  Administered 2015-02-05: 16 mL via INTRAVENOUS

## 2015-06-04 ENCOUNTER — Ambulatory Visit: Payer: 59 | Admitting: Neurology

## 2015-06-08 ENCOUNTER — Encounter: Payer: Self-pay | Admitting: Neurology

## 2015-06-08 ENCOUNTER — Ambulatory Visit (INDEPENDENT_AMBULATORY_CARE_PROVIDER_SITE_OTHER): Payer: 59 | Admitting: Neurology

## 2015-06-08 VITALS — BP 140/92 | HR 80 | Resp 16 | Ht 64.5 in | Wt 178.0 lb

## 2015-06-08 DIAGNOSIS — G4733 Obstructive sleep apnea (adult) (pediatric): Secondary | ICD-10-CM | POA: Diagnosis not present

## 2015-06-08 DIAGNOSIS — Z9989 Dependence on other enabling machines and devices: Secondary | ICD-10-CM

## 2015-06-08 DIAGNOSIS — E663 Overweight: Secondary | ICD-10-CM

## 2015-06-08 NOTE — Progress Notes (Signed)
Subjective:    Patient ID: Erika Bolton is a 59 y.o. female.  HPI     Interim history:   Erika Bolton is a 59 year old right-handed woman with an underlying medical history of colonic polyps, left bundle branch block, plantar fasciatis s/p surgery 3 years ago, MGUS, and overweight state, who presents for follow-up consultation of her obstructive sleep apnea, on treatment with CPAP. The patient is unaccompanied today. I last saw her on 12/01/2014, at which time she reported that she was still adjusting to treatment. She had difficulty in the beginning. She was able to sleep a little bit more. She noted improvement in her nocturia and was overall willing to continue with treatment. She was compliant with treatment and was commended for this and encouraged to continue.  Today, 06/08/2015: I reviewed her CPAP compliance data from 05/02/2015 through 05/31/2015 which is a total of 30 days during which time she used her machine 27 days with percent used days greater than 4 hours at 87%, indicating very good compliance with an average usage of 6 hours and 12 minutes, residual AHI low at 0.3 per hour, leaked low. Pressure 5 cm with EPR of 1.  Today, 06/08/2015: She reports compliance with treatment. However, in the recent past she has noted that in the middle of the night while using CPAP, the pressure would jump from 5 cm to 10 cm. This would wake her up. She had contacted her DME company several times and by 05/28/2015 she stopped using her old machine and took it in on 06/02/2015. They gave her new machine, same model but she feels that this new machine is louder than the other one. We will set up an appointment with her DME company to troubleshoot. Other than that she is willing to continue with treatments. She has noted that when she has not used it in the recent past that she was not sleeping as well. She continues to work full-time. She has gained a little bit of weight. She would like to try to lose  weight. She is motivated to work harder on it.   Previously:  I first met her on 07/17/2014 at the request of her primary care provider, at which time the patient reported snoring and witnessed apneas while asleep, as well as morning headaches and nocturia. I invited her back for sleep study. She had a baseline sleep study on 08/24/2014 as well as a CPAP titration study on 09/27/2014. I went over her test results with her in detail today. Her baseline sleep study from 08/24/2014 showed a sleep efficiency of 78.8% with a normal sleep latency and wake after sleep onset of 72 minutes with moderate sleep fragmentation noted. She had a mildly increased percentage of stage I and stage II sleep, and a reduced percentage of REM sleep with a normal REM latency. She had no significant PLMS. She had mild to moderate and at times louder snoring. She slept mostly on her sides. Her total AHI was 10.4 per hour, rising to 33 per hour during REM sleep. Average oxygen saturation was 94%, nadir was 75%. Based on her test results I invited her back for CPAP titration overnight. Her study was on 09/27/2014. She had a sleep efficiency of 72.4% with a prolonged sleep latency and wake after sleep onset of 83 minutes with overall mild sleep fragmentation noted. She had a normal arousal index. She had a normal percentage of stage I and stage II sleep. She had normal deep sleep, and a  normal percentage of REM sleep with a normal REM latency. She had borderline periodic leg movements at 5.7 per hour, with no significant arousals. She was titrated on CPAP from 5-6 cm. Her AHI was 0 per hour on a pressure of 5 cm. Supine REM sleep was not achieved. Her average oxygen saturation was 95%, nadir was 92%. Based on the test results are prescribed CPAP therapy for home use.  I reviewed her CPAP compliance data from 10/31/2014 through 11/29/2014 which is a total of 30 days during which time she used her machine every night with percent used days  greater than 4 hours at 90%, indicating excellent compliance with an average usage of 6 hours and 47 minutes, residual AHI low at 0.3 per hour, leaked low with the 95th percentile at 1.2 L/m on a pressure of 5 cm with EPR of 1.   She reports snoring and witnessed apneas, per husband. This has been ongoing for about 6 months. She has had morning HAs, and has nocturia 1-2 per night. She has sleep disruption. She denies a FHx of OSA, and has no RLS Sx and is not known to kick in her sleep. Her bedtime is 10 PM and she falls asleep quickly. She typically wakes up in the early morning hours around 3 AM and between 3 AM and wake time of 6 AM she goes in and out of light sleep. She does not wake up rested and feels groggy first thing in the morning. She does not watch TV in bed. She drinks 1-1-1/2 cups of coffee per day. She drinks 1-1-1/2 glasses of wine in the evenings. She does not smoke. 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 She does not nap. Her Epworth sleepiness score is 5 out of 24 today.  Her Past Medical History Is Significant For: Past Medical History  Diagnosis Date  . Colon polyps 2003  . Precancerous lesion 1987    mole excised from vulva  . Menorrhagia   . Left bundle branch block 2012  . Plantar fasciitis   . MGUS (monoclonal gammopathy of unknown significance)     Her Past Surgical History Is Significant For: Past Surgical History  Procedure Laterality Date  . Ablation  11/10/02    Hysteroscopic Thermal  . Other surgical history      Sebaceous cyst on head removed 7x's  . Laparoscopic cholecystectomy  11/2001  . Cyst on scalp      sebaccous(multiple times as a teen)  . Left leg muscle surgery  2012  . Gastro surgery  2012    Her Family History Is Significant For: Family History  Problem Relation Age of Onset  . Colon cancer Father   . Alzheimer's disease Father   . Emphysema Father   . COPD Father   . Breast cancer  Maternal Aunt   . Heart attack Mother   . Cancer Brother     peritod gland cancer    Her Social History Is Significant For: Social History   Social History  . Marital Status: Married    Spouse Name: N/A  . Number of Children: 2  . Years of Education: M.ED.   Occupational History  .      [employer] History Main Topics  . Smoking status: Never Smoker   . Smokeless tobacco: Never Used  . Alcohol Use: 3.0 - 4.2 oz/week    5-7 Glasses of wine per week  . Drug Use:  No  . Sexual Activity:    Partners: Male    Birth Control/ Protection: Post-menopausal   Other Topics Concern  . None   Social History Narrative   2 cups of coffee a day     Her Allergies Are:  Allergies  Allergen Reactions  . Penicillins Swelling    Throat swelling  :   Her Current Medications Are:  Outpatient Encounter Prescriptions as of 06/08/2015  Medication Sig  . aspirin 81 MG tablet Take 81 mg by mouth daily.  . carboxymethylcellulose (REFRESH PLUS) 0.5 % SOLN Place 1 drop into both eyes daily.  . cholecalciferol (VITAMIN D) 1000 UNITS tablet Take 1,000 Units by mouth daily.  . famotidine (PEPCID) 10 MG tablet Take 10 mg by mouth as needed.   . fluticasone (FLONASE) 50 MCG/ACT nasal spray Place 1 spray into both nostrils daily.  . Ivermectin 1 % CREA Apply topically.  . sodium chloride (OCEAN) 0.65 % SOLN nasal spray Place 1 spray into both nostrils daily as needed for congestion.  Marland Kitchen tretinoin microspheres (RETIN-A MICRO) 0.04 % gel Apply 0.025 % topically every other day. Uses twice a week   No facility-administered encounter medications on file as of 06/08/2015.  :  Review of Systems:  Out of a complete 14 point review of systems, all are reviewed and negative with the exception of these symptoms as listed below:  Review of Systems  Neurological:       Patient has had trouble with the CPAP machine itself. No new problems or concerns. Reports that she sleeps better with CPAP.      Objective:  Neurologic Exam  Physical Exam Physical Examination:   Filed Vitals:   06/08/15 0837  BP: 140/92  Pulse: 80  Resp: 16    General Examination: The patient is a very pleasant 59 y.o. female in no acute distress. She appears well-developed and well-nourished and well groomed. She is in good spirits today.  HEENT: Normocephalic, atraumatic, pupils are equal, round and reactive to light and accommodation. Extraocular tracking is good without limitation to gaze excursion or nystagmus noted. Normal smooth pursuit is noted. Hearing is grossly intact. Face is symmetric with normal facial animation and normal facial sensation. Speech is clear with no dysarthria noted. There is no hypophonia. There is no lip, neck/head, jaw or voice tremor. Neck is supple with full range of passive and active motion. There are no carotid bruits on auscultation. Oropharynx exam reveals: mild mouth dryness, good dental hygiene and mild to moderate airway crowding, due to narrow airway entry, mildly enlarged uvula, slightly longer tongue and tonsils in place of 1+ in size bilaterally. Mallampati is class II. Tongue protrudes centrally and palate elevates symmetrically. She has a very tiny overbite.  Chest: Clear to auscultation without wheezing, rhonchi or crackles noted.  Heart: S1+S2+0, regular and normal without murmurs, rubs or gallops noted.   Abdomen: Soft, non-tender and non-distended with normal bowel sounds appreciated on auscultation.  Extremities: There is no pitting edema in the distal lower extremities bilaterally. Pedal pulses are intact.  Skin: Warm and dry without trophic changes noted. There are no varicose veins.  Musculoskeletal: exam reveals no obvious joint deformities, tenderness or joint swelling or erythema.   Neurologically:  Mental status: The patient is awake, alert and oriented in all 4 spheres. Her immediate and remote memory, attention, language skills and fund of  knowledge are appropriate. There is no evidence of aphasia, agnosia, apraxia or anomia. Speech is clear with normal prosody  and enunciation. Thought process is linear. Mood is normal and affect is normal.  Cranial nerves II - XII are as described above under HEENT exam. In addition: shoulder shrug is normal with equal shoulder height noted. Motor exam: Normal bulk, strength and tone is noted. There is no drift, tremor or rebound. Romberg is negative. Reflexes are 2+ throughout. Fine motor skills and coordination: intact with normal finger taps, normal hand movements, normal rapid alternating patting, normal foot taps and normal foot agility.  Cerebellar testing: No dysmetria or intention tremor on finger to nose testing. Heel to shin is unremarkable bilaterally. There is no truncal or gait ataxia.  Sensory exam: intact to light touch in the upper and lower extremities.  Gait, station and balance: She stands easily. No veering to one side is noted. No leaning to one side is noted. Posture is age-appropriate and stance is narrow based. Gait shows normal stride length and normal pace. No problems turning are noted. She turns en bloc. Tandem walk is unremarkable.  Assessment and Plan:   In summary, Erika Bolton is a very pleasant 59 year old female with an underlying medical history of colonic polyps, left bundle branch block, plantar fasciatis s/p surgery 3 years ago, MGUS, and overweight state, who presents for follow-up consultation of her obstructive sleep apnea, on treatment with CPAP at 5 cm water pressure. While she did not have a day and night difference in her sleep she noticed with time that she was sleeping better. She is currently adjusting to new machine because her old machine was jumping to a higher pressure in the middle of the night, waking her up. She does complain that the new machine is louder than the old one and she is a light sleeper and this bothers her now. We will try to get in touch  with her DME company to set up an appointment to troubleshoot these issues. Overall, she is fully compliant with treatment and is highly commended for this because she has struggled with adjusting to it. She has gained a little bit of weight. She is motivated to try to lose weight. We talked about sleep apnea treatment, we talked about her previous sleep studies as well. She does agree that she has done better with CPAP as opposed to without it. She is motivated to continue with treatment. At this juncture, I suggested a one-year follow-up routinely for sleep apnea checkup. She is encouraged to try to work on weight loss in the interim. She is in agreement.  I encouraged her to call with any interim questions, concerns, problems or updates.  I spent 25 minutes in total face-to-face time with the patient, more than 50% of which was spent in counseling and coordination of care, reviewing test results, reviewing medication and discussing or reviewing the diagnosis of OSA, its prognosis and treatment options.

## 2015-06-08 NOTE — Patient Instructions (Signed)
Please continue using your CPAP regularly. While your insurance requires that you use CPAP at least 4 hours each night on 70% of the nights, I recommend, that you not skip any nights and use it throughout the night if you can. Getting used to CPAP and staying with the treatment long term does take time and patience and discipline. Untreated obstructive sleep apnea when it is moderate to severe can have an adverse impact on cardiovascular health and raise her risk for heart disease, arrhythmias, hypertension, congestive heart failure, stroke and diabetes. Untreated obstructive sleep apnea causes sleep disruption, nonrestorative sleep, and sleep deprivation. This can have an impact on your day to day functioning and cause daytime sleepiness and impairment of cognitive function, memory loss, mood disturbance, and problems focussing. Using CPAP regularly can improve these symptoms.  Keep up the good work! I will see you back in a year for sleep apnea check up.   

## 2015-06-28 ENCOUNTER — Telehealth: Payer: Self-pay | Admitting: Nurse Practitioner

## 2015-06-28 NOTE — Telephone Encounter (Signed)
Patient has BMD 07/19/15 at [facility] and needs an order sent. Patient says no need to return her call.

## 2015-06-28 NOTE — Telephone Encounter (Signed)
Order for BMD to Kem Boroughs, FNP for review and signature before fax.

## 2015-07-21 ENCOUNTER — Telehealth: Payer: Self-pay | Admitting: Obstetrics & Gynecology

## 2015-07-21 NOTE — Telephone Encounter (Signed)
Spoke with [employer]. Reports from patient's bilateral screening mammogram and BMD to be faxed to the office at this time.

## 2015-07-21 NOTE — Telephone Encounter (Signed)
Mammogram showing asymmetry in the left breast. Additional view are recommended using 3D mammogram technology.   Bone density is showing osteopenia of the right hip and spine.  Left hip is borderline between normal and osteopenia.  Recommendations are for weight bearing exercise, calcium 1200 mg daily in divided doses (preferably food sources) and vitamin D 600 - 800 IU daily.  Next bone density in 2 -3 years.   Cc- Dr. Edwinna Areola

## 2015-07-21 NOTE — Telephone Encounter (Signed)
Patient has a MMG on 07/19/15 at [facility] and told she needed a diagnostic MMG. Patient was told that she could not schedule the Diagnostic MMG with out an order from [place]. Patient would like to have this Diagnostic MMG done before Christmas.

## 2015-07-21 NOTE — Telephone Encounter (Signed)
Received results from [place]. Results to Dr.Silva as Dr.Miller is out of the office today and the patient would like imaging as soon as possible.

## 2015-07-22 NOTE — Telephone Encounter (Signed)
Order faxed to [facility] with cover sheet and confirmation. Will close encounter.

## 2015-07-22 NOTE — Telephone Encounter (Signed)
Spoke with [employer]. Per Kennyth Lose patient needs additional imaging from her 3D mammogram with a left breast diagnostic mammogram with ultrasound if needed. Order written and to [place] for review and signature. Results from 3D screening are also on Dr.Miller's desk. Per Kennyth Lose patient is scheduled for left breast diagnostic and ultrasound on 07/27/2015 at 1pm.  Left message for patient to call [place] at [phone].

## 2015-07-22 NOTE — Telephone Encounter (Signed)
Return call to Kaitlyn. °

## 2015-07-22 NOTE — Telephone Encounter (Signed)
Spoke with patient. Advised of message as seen below from [place]. Patent states she has an appointment scheduled for follow up breast imaging on 07/27/2015 at [facility]. BMD results given. Patient verbalizes understanding.

## 2015-07-28 ENCOUNTER — Telehealth: Payer: Self-pay | Admitting: Nurse Practitioner

## 2015-07-28 NOTE — Telephone Encounter (Signed)
Please let patient know that BMD done on 07/19/15 shows a T Score at the spine of -1.2; right hip neck at -1.2; left hip neck at -1.0.  Comparison to previous study 06/26/2006 there has been a decrease at the spine and left hip.  No previous comparison to the right hip.  She is now in the low normal range or upper osteopenic range compared to upper normal in the past. The FRAX score for major fracture in 10 years is 6.5% (goal is<20%); the FRAX score for hip fracture in 10 years is 0.4% (goal is <3%).  While some bone loss is normal and expectant we do not want to see further loss. She must maintain good calcium and Vit d support.  She must continue with exercise program of walking and upper body weights.  Repeat BMD in 2 years.

## 2015-08-03 ENCOUNTER — Encounter: Payer: Self-pay | Admitting: Nurse Practitioner

## 2015-08-03 ENCOUNTER — Ambulatory Visit (INDEPENDENT_AMBULATORY_CARE_PROVIDER_SITE_OTHER): Payer: 59 | Admitting: Nurse Practitioner

## 2015-08-03 VITALS — BP 120/76 | HR 72 | Ht 64.5 in | Wt 180.0 lb

## 2015-08-03 DIAGNOSIS — Z0000 Encounter for general adult medical examination without abnormal findings: Secondary | ICD-10-CM | POA: Diagnosis not present

## 2015-08-03 DIAGNOSIS — R87619 Unspecified abnormal cytological findings in specimens from cervix uteri: Secondary | ICD-10-CM | POA: Diagnosis not present

## 2015-08-03 DIAGNOSIS — E559 Vitamin D deficiency, unspecified: Secondary | ICD-10-CM | POA: Diagnosis not present

## 2015-08-03 DIAGNOSIS — N200 Calculus of kidney: Secondary | ICD-10-CM | POA: Insufficient documentation

## 2015-08-03 DIAGNOSIS — Z01419 Encounter for gynecological examination (general) (routine) without abnormal findings: Secondary | ICD-10-CM | POA: Diagnosis not present

## 2015-08-03 NOTE — Progress Notes (Signed)
Encounter reviewed Jill Jertson, MD   

## 2015-08-03 NOTE — Patient Instructions (Signed)

## 2015-08-03 NOTE — Telephone Encounter (Signed)
Pt notified of results at office visit today.  To discuss more with Edman Circle, FNP.

## 2015-08-03 NOTE — Progress Notes (Signed)
Patient ID: Erika Bolton, female   DOB: 1955-08-23, 60 y.o.   MRN: [id number] 59 y.o. [id number] Married  Caucasian Fe here for annual exam.  Had mammogram and [place] in 12/16 and is going back far a recheck in 6 months due to a complex cyst.  She feels well and is trying to do better with calcium and Vit d since her BMD showing mild Osteopenia.  Patient's last menstrual period was 08/01/2007 (approximate).          Sexually active: Yes.    The current method of family planning is post menopausal status.    Exercising: Yes.    circuit at gym, stationary bike, yoga and walking DVD at work Smoker:  no  Health Maintenance: Pap: 12/10/12, Negative with neg HR HPV MMG: 07/19/15 with Left Diagnostic MMG and Ultrasound 07/27/15, follow up in 6 months Colonoscopy: 2014, normal per patient, repeat in 5 years due to family history of colon cancer BMD:07/19/15, T Score: -1.20 Spine / -1.20 Right Femur Neck / -1.00 Left Femur Neck, follow up in 2-3 years TDaP:02/24/12 Shingles: Not indicated due to age Pneumonia: Not indicated due to age Hep C and HIV: drawn today Labs: Vit D drawn today, oncology follows others   reports that she has never smoked. She has never used smokeless tobacco. She reports that she drinks about 3.0 - 4.2 oz of alcohol per week. She reports that she does not use illicit drugs.  Past Medical History  Diagnosis Date  . Colon polyps 2003  . Precancerous lesion 1987    mole excised from vulva  . Menorrhagia   . Left bundle branch block 2012  . Plantar fasciitis   . MGUS (monoclonal gammopathy of unknown significance)   . Kidney stone 2016    Past Surgical History  Procedure Laterality Date  . Ablation  11/10/02    Hysteroscopic Thermal  . Other surgical history      Sebaceous cyst on head removed 7x's  . Laparoscopic cholecystectomy  11/2001  . Cyst on scalp      sebaccous(multiple times as a teen)  . Left leg muscle surgery  2012  . Gastro surgery  2012    Current  Outpatient Prescriptions  Medication Sig Dispense Refill  . aspirin 81 MG tablet Take 81 mg by mouth daily.    . Azelaic Acid (FINACEA) 15 % FOAM Apply topically daily.    . carboxymethylcellulose (REFRESH PLUS) 0.5 % SOLN Place 1 drop into both eyes daily.    . cholecalciferol (VITAMIN D) 1000 UNITS tablet Take 1,000 Units by mouth daily.    . famotidine (PEPCID) 10 MG tablet Take 10 mg by mouth as needed.     . fluticasone (FLONASE) 50 MCG/ACT nasal spray Place 1 spray into both nostrils daily.    . sodium chloride (OCEAN) 0.65 % SOLN nasal spray Place 1 spray into both nostrils daily as needed for congestion.    . Sulfacetamide in Bakuchiol (SODIUM SULFACETAMIDE WASH) 10 % LIQD Apply topically daily.    Marland Kitchen tretinoin microspheres (RETIN-A MICRO) 0.04 % gel Apply 0.025 % topically every other day. Uses twice a week     No current facility-administered medications for this visit.    Family History  Problem Relation Age of Onset  . Colon cancer Father   . Alzheimer's disease Father   . Emphysema Father   . COPD Father   . Breast cancer Maternal Aunt   . Heart attack Mother   . Cancer Brother  peritod gland cancer    ROS:  Pertinent items are noted in HPI.  Otherwise, a comprehensive ROS was negative.  Exam:   BP 120/76 mmHg  Pulse 72  Ht 5' 4.5" (1.638 m)  Wt 180 lb (81.647 kg)  BMI 30.43 kg/m2  LMP 08/01/2007 (Approximate) Height: 5' 4.5" (163.8 cm) Ht Readings from Last 3 Encounters:  08/03/15 5' 4.5" (1.638 m)  06/08/15 5' 4.5" (1.638 m)  01/18/15 5' 4.5" (1.638 m)    General appearance: alert, cooperative and appears stated age Head: Normocephalic, without obvious abnormality, atraumatic Neck: no adenopathy, supple, symmetrical, trachea midline and thyroid normal to inspection and palpation Lungs: clear to auscultation bilaterally Breasts: normal appearance, no masses or tenderness Heart: regular rate and rhythm Abdomen: soft, non-tender; no masses,  no  organomegaly Extremities: extremities normal, atraumatic, no cyanosis or edema Skin: Skin color, texture, turgor normal. No rashes or lesions Lymph nodes: Cervical, supraclavicular, and axillary nodes normal. No abnormal inguinal nodes palpated Neurologic: Grossly normal   Pelvic: External genitalia:  no lesions              Urethra:  normal appearing urethra with no masses, tenderness or lesions              Bartholin's and Skene's: normal                 Vagina: normal appearing vagina with normal color and discharge, no lesions              Cervix: anteverted              Pap taken: Yes.   Bimanual Exam:  Uterus:  normal size, contour, position, consistency, mobility, non-tender              Adnexa: no mass, fullness, tenderness               Rectovaginal: Confirms               Anus:  normal sphincter tone, no lesions  Chaperone present: yes  A:  Well Woman with normal exam  Monoclonal gammopathy 07/2007, Dr. Alvy Bimler at the [facility] monitor yearly [place] breast cancer in 2 aunts and colon cancer in father  HRT from 12/2008 and ended in April 2012 History of Vit D deficiency - now on OTC   P:   Reviewed health and wellness pertinent to exam  Pap smear as above  Mammogram is due for a repeat 12/2015 - trying to get final report from [place] - they will send updated addendum.  Counseled on breast self exam, mammography screening, adequate intake of calcium and vitamin D, diet and exercise return annually or prn  An After Visit Summary was printed and given to the patient.

## 2015-08-04 LAB — HIV ANTIBODY (ROUTINE TESTING W REFLEX): HIV: NONREACTIVE

## 2015-08-04 LAB — VITAMIN D 25 HYDROXY (VIT D DEFICIENCY, FRACTURES): Vit D, 25-Hydroxy: 34 ng/mL (ref 30–100)

## 2015-08-04 LAB — HEPATITIS C ANTIBODY: HCV Ab: NEGATIVE

## 2015-08-06 LAB — IPS PAP TEST WITH HPV

## 2015-08-06 NOTE — Addendum Note (Signed)
Addended by: Kem Boroughs R on: 08/06/2015 10:11 AM   Modules accepted: Orders

## 2015-08-10 LAB — IPS HPV GENOTYPING 16/18

## 2015-08-16 ENCOUNTER — Telehealth: Payer: Self-pay | Admitting: Emergency Medicine

## 2015-08-16 NOTE — Telephone Encounter (Signed)
-----   Message from Kem Boroughs, [place] sent at 08/13/2015  8:41 AM EST ----- Please let pt know that her pap was normal but she did have + HR HPV.  We then did the genotype # 16 & 18 and it was negative.  She needs to know that we need to repeat a pap in a year.  08 recall.  She had past history of a vulvar lesion that was precancerous 1987.  No notes if that was related to HPV.

## 2015-08-16 NOTE — Telephone Encounter (Signed)
Spoke with patient and message from Kem Boroughs, FNP given to patient.  Discussed normal pap smear and HPV results positive  With negative subtyping 16/18/45. Discussed prior pap smear results from 2014 as well.  Advised repeat pap smear in one year is recommended. 08 recall in place.  Patient requests that these results be sent to her home address, Patient requests that copies of lab results be mailed to her home address. Patient name, DOB and home address confirmed verbally with patient and verbal release of records completed and mailed to home address of record. Also requested copies of results be sent to PCP patient confirms, Dr. Lavone Orn.   Routing to provider for final review. Patient agreeable to disposition. Will close encounter.

## 2015-09-07 DIAGNOSIS — H43811 Vitreous degeneration, right eye: Secondary | ICD-10-CM

## 2015-09-07 HISTORY — DX: Vitreous degeneration, right eye: H43.811

## 2015-10-29 ENCOUNTER — Telehealth: Payer: Self-pay | Admitting: Hematology and Oncology

## 2015-10-29 NOTE — Telephone Encounter (Signed)
returned call adn s.w. pt and  r/s appt.Marland KitchenMarland KitchenMarland KitchenMarland Kitchenpt ok and aware

## 2015-12-08 ENCOUNTER — Ambulatory Visit (INDEPENDENT_AMBULATORY_CARE_PROVIDER_SITE_OTHER): Payer: 59 | Admitting: Neurology

## 2015-12-08 ENCOUNTER — Encounter: Payer: Self-pay | Admitting: Neurology

## 2015-12-08 VITALS — BP 142/82 | HR 78 | Resp 16 | Ht 64.5 in | Wt 175.0 lb

## 2015-12-08 DIAGNOSIS — G4733 Obstructive sleep apnea (adult) (pediatric): Secondary | ICD-10-CM | POA: Diagnosis not present

## 2015-12-08 DIAGNOSIS — Z9989 Dependence on other enabling machines and devices: Secondary | ICD-10-CM

## 2015-12-08 DIAGNOSIS — E663 Overweight: Secondary | ICD-10-CM | POA: Diagnosis not present

## 2015-12-08 NOTE — Patient Instructions (Signed)
We will try a different nasal mask.  Please look into using a salt water nasal rinse system, such as the [employer] and follow the directions and my instructions. It may help your nasal congestion and help you tolerate your CPAP.

## 2015-12-08 NOTE — Progress Notes (Signed)
Subjective:    Patient ID: Erika Bolton is a 60 y.o. female.  HPI     Interim history:   Erika Bolton is a 60 year old right-handed woman with an underlying medical history of colonic polyps, left bundle branch block, plantar fasciatis s/p surgery 3 years ago, MGUS, and overweight state, who presents for follow-up consultation of Erika Bolton obstructive sleep apnea, on treatment with CPAP. The patient is unaccompanied today. I last saw Erika Bolton on 06/08/2015, at which time Erika Bolton reported difficulty with CPAP tolerance. Erika Bolton did have some dysfunction of Erika Bolton machine which contributed to this and Erika Bolton got a new machine. However the new machine was louder than the first one. Erika Bolton was compliant with treatment and commended for this. Erika Bolton was working full-time. Erika Bolton did gain a little bit of weight and was motivated to work on weight loss.  Today, 12/08/2015: I reviewed Erika Bolton CPAP compliance data from 11/07/2015 through 12/06/2015, which is a total of 30 days during which time Erika Bolton used Erika Bolton machine every night with percent used days greater than 4 hours at 97%, indicating excellent compliance with an average usage of 6 hours and 1 minute, residual AHI very low at 0.1 per hour, leak very low at 0.7 L/m for the 95th percentile, pressure at 5 cm with EPR of 1.  Today, 12/08/2015: Erika Bolton reports not sleeping very well, has nasal congestion, some sensation in the night of front teeth hurting. This has been going on for the past couple of months. Erika Bolton has not had any other allergy symptoms, occasional post-nasal drip. Does not think Erika Bolton grinds Erika Bolton teeth, went for routine dental in January, uses nasal pillows.   Previously:  I saw Erika Bolton on 12/01/2014, at which time Erika Bolton reported that Erika Bolton was still adjusting to treatment. Erika Bolton had difficulty in the beginning. Erika Bolton was able to sleep a little bit more. Erika Bolton noted improvement in Erika Bolton nocturia and was overall willing to continue with treatment. Erika Bolton was compliant with treatment and was commended for  this and encouraged to continue.  I reviewed Erika Bolton CPAP compliance data from 05/02/2015 through 05/31/2015 which is a total of 30 days during which time Erika Bolton used Erika Bolton machine 27 days with percent used days greater than 4 hours at 87%, indicating very good compliance with an average usage of 6 hours and 12 minutes, residual AHI low at 0.3 per hour, leaked low. Pressure 5 cm with EPR of 1.  I first met Erika Bolton on 07/17/2014 at the request of Erika Bolton primary care provider, at which time the patient reported snoring and witnessed apneas while asleep, as well as morning headaches and nocturia. I invited Erika Bolton back for sleep study. Erika Bolton had a baseline sleep study on 08/24/2014 as well as a CPAP titration study on 09/27/2014. I went over Erika Bolton test results with Erika Bolton in detail today. Erika Bolton baseline sleep study from 08/24/2014 showed a sleep efficiency of 78.8% with a normal sleep latency and wake after sleep onset of 72 minutes with moderate sleep fragmentation noted. Erika Bolton had a mildly increased percentage of stage I and stage II sleep, and a reduced percentage of REM sleep with a normal REM latency. Erika Bolton had no significant PLMS. Erika Bolton had mild to moderate and at times louder snoring. Erika Bolton slept mostly on Erika Bolton sides. Erika Bolton total AHI was 10.4 per hour, rising to 33 per hour during REM sleep. Average oxygen saturation was 94%, nadir was 75%. Based on Erika Bolton test results I invited Erika Bolton back for CPAP titration overnight. Erika Bolton study was on 09/27/2014.  Erika Bolton had a sleep efficiency of 72.4% with a prolonged sleep latency and wake after sleep onset of 83 minutes with overall mild sleep fragmentation noted. Erika Bolton had a normal arousal index. Erika Bolton had a normal percentage of stage I and stage II sleep. Erika Bolton had normal deep sleep, and a normal percentage of REM sleep with a normal REM latency. Erika Bolton had borderline periodic leg movements at 5.7 per hour, with no significant arousals. Erika Bolton was titrated on CPAP from 5-6 cm. Erika Bolton AHI was 0 per hour on a pressure of 5 cm. Supine  REM sleep was not achieved. Erika Bolton average oxygen saturation was 95%, nadir was 92%. Based on the test results are prescribed CPAP therapy for home use.  I reviewed Erika Bolton CPAP compliance data from 10/31/2014 through 11/29/2014 which is a total of 30 days during which time Erika Bolton used Erika Bolton machine every night with percent used days greater than 4 hours at 90%, indicating excellent compliance with an average usage of 6 hours and 47 minutes, residual AHI low at 0.3 per hour, leaked low with the 95th percentile at 1.2 L/m on a pressure of 5 cm with EPR of 1.   Erika Bolton reports snoring and witnessed apneas, per husband. This has been ongoing for about 6 months. Erika Bolton has had morning HAs, and has nocturia 1-2 per night. Erika Bolton has sleep disruption. Erika Bolton denies a FHx of OSA, and has no RLS Sx and is not known to kick in Erika Bolton sleep. Erika Bolton bedtime is 10 PM and Erika Bolton falls asleep quickly. Erika Bolton typically wakes up in the early morning hours around 3 AM and between 3 AM and wake time of 6 AM Erika Bolton goes in and out of light sleep. Erika Bolton does not wake up rested and feels groggy first thing in the morning. Erika Bolton does not watch TV in bed. Erika Bolton drinks 1-1-1/2 cups of coffee per day. Erika Bolton drinks 1-1-1/2 glasses of wine in the evenings. Erika Bolton does not smoke. 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 Erika Bolton does not nap. Erika Bolton Epworth sleepiness score is 5 out of 24 today.  Erika Bolton Past Medical History Is Significant For: Past Medical History  Diagnosis Date  . Colon polyps 2003  . Precancerous lesion 1987    mole excised from vulva  . Menorrhagia   . Left bundle branch block 2012  . Plantar fasciitis   . MGUS (monoclonal gammopathy of unknown significance)   . Kidney stone 2016    Erika Bolton Past Surgical History Is Significant For: Past Surgical History  Procedure Laterality Date  . Ablation  11/10/02    Hysteroscopic Thermal  . Other surgical history      Sebaceous cyst on head removed 7x's  . Laparoscopic  cholecystectomy  11/2001  . Cyst on scalp      sebaccous(multiple times as a teen)  . Left leg muscle surgery  2012  . Gastro surgery  2012    Erika Bolton Family History Is Significant For: Family History  Problem Relation Age of Onset  . Colon cancer Father 8  . Alzheimer's disease Father   . Emphysema Father   . COPD Father   . Breast cancer Maternal Aunt     post menopause  . Heart attack Mother   . Cancer Brother     peritod gland cancer    Erika Bolton Social History Is Significant For: Social History   Social History  . Marital Status: Married    Spouse Name: N/A  . Number of  Children: 2  . Years of Education: M.ED.   Occupational History  .      [place] History Main Topics  . Smoking status: Never Smoker   . Smokeless tobacco: Never Used  . Alcohol Use: 3.0 - 4.2 oz/week    5-7 Glasses of wine per week  . Drug Use: No  . Sexual Activity:    Partners: Male    Birth Control/ Protection: Post-menopausal   Other Topics Concern  . None   Social History Narrative   2 cups of coffee a day     Erika Bolton Allergies Are:  Allergies  Allergen Reactions  . Penicillins Swelling    Throat swelling  :   Erika Bolton Current Medications Are:  Outpatient Encounter Prescriptions as of 12/08/2015  Medication Sig  . aspirin 81 MG tablet Take 81 mg by mouth daily.  . Azelaic Acid (FINACEA) 15 % FOAM Apply topically daily.  . carboxymethylcellulose (REFRESH PLUS) 0.5 % SOLN Place 1 drop into both eyes daily.  . cholecalciferol (VITAMIN D) 1000 UNITS tablet Take 1,000 Units by mouth daily.  . famotidine (PEPCID) 10 MG tablet Take 10 mg by mouth as needed.   . fluticasone (FLONASE) 50 MCG/ACT nasal spray Place 1 spray into both nostrils daily.  . sodium chloride (OCEAN) 0.65 % SOLN nasal spray Place 1 spray into both nostrils daily as needed for congestion.  . Sulfacetamide in Bakuchiol (SODIUM SULFACETAMIDE WASH) 10 % LIQD Apply topically daily.  Marland Kitchen tretinoin microspheres (RETIN-A  MICRO) 0.04 % gel Apply 0.025 % topically every other day. Uses twice a week   No facility-administered encounter medications on file as of 12/08/2015.  :  Review of Systems:  Out of a complete 14 point review of systems, all are reviewed and negative with the exception of these symptoms as listed below:  Review of Systems  Neurological:       Patient states that Erika Bolton is not sleeping well at night. Does not know if it is coming from CPAP or not.     Objective:  Neurologic Exam  Physical Exam Physical Examination:   Filed Vitals:   12/08/15 1316  BP: 142/82  Pulse: 78  Resp: 16    General Examination: The patient is a very pleasant 60 y.o. female in no acute distress. Erika Bolton appears well-developed and well-nourished and well groomed. Erika Bolton is in good spirits today.  HEENT: Normocephalic, atraumatic, pupils are equal, round and reactive to light and accommodation. Extraocular tracking is good without limitation to gaze excursion or nystagmus noted. Normal smooth pursuit is noted. Hearing is grossly intact. Face is symmetric with normal facial animation and normal facial sensation. Speech is clear with no dysarthria noted. There is no hypophonia. There is no lip, neck/head, jaw or voice tremor. Neck is supple with full range of passive and active motion. There are no carotid bruits on auscultation. Oropharynx exam reveals: mild mouth dryness, good dental hygiene and mild to moderate airway crowding, due to narrow airway entry, mildly enlarged uvula, slightly longer tongue and tonsils in place of 1+ in size bilaterally. Mallampati is class II. Tongue protrudes centrally and palate elevates symmetrically. Erika Bolton has a very tiny overbite.Teeth and gums look healthy, no clear evidence of tooth grinding, nostrils not irritated, no sores, maybe mild erythema in the nasal passages, right more than left but no significant swelling, no drainage.  Chest: Clear to auscultation without wheezing, rhonchi or  crackles noted.  Heart: S1+S2+0, regular and normal without murmurs,  rubs or gallops noted.   Abdomen: Soft, non-tender and non-distended with normal bowel sounds appreciated on auscultation.  Extremities: There is no pitting edema in the distal lower extremities bilaterally. Pedal pulses are intact.  Skin: Warm and dry without trophic changes noted. There are no varicose veins.  Musculoskeletal: exam reveals no obvious joint deformities, tenderness or joint swelling or erythema.   Neurologically:  Mental status: The patient is awake, alert and oriented in all 4 spheres. Erika Bolton immediate and remote memory, attention, language skills and fund of knowledge are appropriate. There is no evidence of aphasia, agnosia, apraxia or anomia. Speech is clear with normal prosody and enunciation. Thought process is linear. Mood is normal and affect is normal.  Cranial nerves II - XII are as described above under HEENT exam. In addition: shoulder shrug is normal with equal shoulder height noted. Motor exam: Normal bulk, strength and tone is noted. There is no drift, tremor or rebound. Romberg is negative. Reflexes are 2+ throughout. Fine motor skills and coordination: intact with normal finger taps, normal hand movements, normal rapid alternating patting, normal foot taps and normal foot agility.  Cerebellar testing: No dysmetria or intention tremor on finger to nose testing. Heel to shin is unremarkable bilaterally. There is no truncal or gait ataxia.  Sensory exam: intact to light touch in the upper and lower extremities.  Gait, station and balance: Erika Bolton stands easily. No veering to one side is noted. No leaning to one side is noted. Posture is age-appropriate and stance is narrow based. Gait shows normal stride length and normal pace. No problems turning are noted. Erika Bolton turns en bloc. Tandem walk is unremarkable.  Assessment and Plan:   In summary, AMISHA POSPISIL is a very pleasant 60 year old female with an  underlying medical history of colonic polyps, left bundle branch block, plantar fasciatis s/p surgery 3 years ago, MGUS, and overweight state, who presents for follow-up consultation of Erika Bolton obstructive sleep apnea, on treatment with CPAP at 5 cm water pressureWith ongoing excellent compliance. Erika Bolton had been doing quite well overall but in the past couple of months Erika Bolton has had some interim problems including waking up with jaw pain in the front teeth area, some nasal congestion, but no telltale allergy symptoms otherwise, just not sleeping well through the night. I suggested we set Erika Bolton up for a mask refit with Erika Bolton D[employer] company, maybe Erika Bolton can try a nose mask rather than nasal pillows. In addition, Erika Bolton is encouraged to try a nasal rinse system such as the Neti pot. I did not see anything sinister on exam. I really cannot explain Erika Bolton symptoms very well. Erika Bolton is encouraged to discuss this with Erika Bolton dentist as well. If there is any evidence of tooth grinding, maybe Erika Bolton can try a custom-made bite guard. From my end of things, Erika Bolton looks well. We reviewed Erika Bolton baseline sleep study results from 08/24/2014 in comparison with Erika Bolton CPAP titration results from 09/27/2014 and there were some telltale differences in improvements with CPAP therapy as opposed to without. Erika Bolton is advised to continue with treatment and keep Erika Bolton appointment for November this year. I answered all Erika Bolton questions today and Erika Bolton was in agreement. I spent 25 minutes in total face-to-face time with the patient, more than 50% of which was spent in counseling and coordination of care, reviewing test results, reviewing medication and discussing or reviewing the diagnosis of OSA, its prognosis and treatment options.

## 2016-01-11 ENCOUNTER — Other Ambulatory Visit: Payer: 59

## 2016-01-12 ENCOUNTER — Other Ambulatory Visit (HOSPITAL_BASED_OUTPATIENT_CLINIC_OR_DEPARTMENT_OTHER): Payer: 59

## 2016-01-12 ENCOUNTER — Ambulatory Visit (HOSPITAL_COMMUNITY)
Admission: RE | Admit: 2016-01-12 | Discharge: 2016-01-12 | Disposition: A | Discharge status: Home / Self Care | Payer: 59 | Source: Ambulatory Visit | Attending: Hematology and Oncology | Admitting: Hematology and Oncology

## 2016-01-12 DIAGNOSIS — D472 Monoclonal gammopathy: Secondary | ICD-10-CM | POA: Diagnosis not present

## 2016-01-12 LAB — CBC WITH DIFFERENTIAL/PLATELET
BASO%: 0.9 % (ref 0.0–2.0)
BASOS ABS: 0.1 10*3/uL (ref 0.0–0.1)
EOS ABS: 0.3 10*3/uL (ref 0.0–0.5)
EOS%: 4.4 % (ref 0.0–7.0)
HEMATOCRIT: 40.8 % (ref 34.8–46.6)
HEMOGLOBIN: 13.7 g/dL (ref 11.6–15.9)
LYMPH#: 1.7 10*3/uL (ref 0.9–3.3)
LYMPH%: 22.6 % (ref 14.0–49.7)
MCH: 29.8 pg (ref 25.1–34.0)
MCHC: 33.5 g/dL (ref 31.5–36.0)
MCV: 88.8 fL (ref 79.5–101.0)
MONO#: 0.6 10*3/uL (ref 0.1–0.9)
MONO%: 8.4 % (ref 0.0–14.0)
NEUT#: 4.8 10*3/uL (ref 1.5–6.5)
NEUT%: 63.7 % (ref 38.4–76.8)
Platelets: 249 10*3/uL (ref 145–400)
RBC: 4.59 10*6/uL (ref 3.70–5.45)
RDW: 13 % (ref 11.2–14.5)
WBC: 7.6 10*3/uL (ref 3.9–10.3)

## 2016-01-12 LAB — COMPREHENSIVE METABOLIC PANEL
ALBUMIN: 3.9 g/dL (ref 3.5–5.0)
ALK PHOS: 93 U/L (ref 40–150)
ALT: 102 U/L — ABNORMAL HIGH (ref 0–55)
ANION GAP: 7 meq/L (ref 3–11)
AST: 61 U/L — AB (ref 5–34)
BUN: 14.6 mg/dL (ref 7.0–26.0)
CALCIUM: 10.3 mg/dL (ref 8.4–10.4)
CHLORIDE: 109 meq/L (ref 98–109)
CO2: 25 mEq/L (ref 22–29)
Creatinine: 0.9 mg/dL (ref 0.6–1.1)
EGFR: 73 mL/min/{1.73_m2} — AB (ref >90)
Glucose: 78 mg/dl (ref 70–140)
POTASSIUM: 3.7 meq/L (ref 3.5–5.1)
Sodium: 142 mEq/L (ref 136–145)
Total Bilirubin: 0.41 mg/dL (ref 0.20–1.20)
Total Protein: 7.4 g/dL (ref 6.4–8.3)

## 2016-01-12 LAB — LACTATE DEHYDROGENASE: LDH: 171 U/L (ref 125–245)

## 2016-01-13 LAB — KAPPA/LAMBDA LIGHT CHAINS
IG KAPPA FREE LIGHT CHAIN: 22.8 mg/L — AB (ref 3.3–19.4)
IG LAMBDA FREE LIGHT CHAIN: 8.8 mg/L (ref 5.7–26.3)
Kappa/Lambda FluidC Ratio: 2.59 — ABNORMAL HIGH (ref 0.26–1.65)

## 2016-01-14 LAB — MULTIPLE MYELOMA PANEL, SERUM
ALBUMIN/GLOB SERPL: 1.4 (ref 0.7–1.7)
ALPHA 1: 0.2 g/dL (ref 0.0–0.4)
ALPHA2 GLOB SERPL ELPH-MCNC: 0.6 g/dL (ref 0.4–1.0)
Albumin SerPl Elph-Mcnc: 4 g/dL (ref 2.9–4.4)
B-GLOBULIN SERPL ELPH-MCNC: 1 g/dL (ref 0.7–1.3)
Gamma Glob SerPl Elph-Mcnc: 1.1 g/dL (ref 0.4–1.8)
Globulin, Total: 3 g/dL (ref 2.2–3.9)
IgA, Qn, Serum: 55 mg/dL — ABNORMAL LOW (ref 87–352)
IgM, Qn, Serum: 30 mg/dL (ref 26–217)
M PROTEIN SERPL ELPH-MCNC: 0.8 g/dL — AB
TOTAL PROTEIN: 7 g/dL (ref 6.0–8.5)

## 2016-01-18 ENCOUNTER — Telehealth: Payer: Self-pay | Admitting: Hematology and Oncology

## 2016-01-18 ENCOUNTER — Encounter: Payer: Self-pay | Admitting: Hematology and Oncology

## 2016-01-18 ENCOUNTER — Ambulatory Visit (HOSPITAL_BASED_OUTPATIENT_CLINIC_OR_DEPARTMENT_OTHER): Payer: 59 | Admitting: Hematology and Oncology

## 2016-01-18 ENCOUNTER — Ambulatory Visit: Payer: 59 | Admitting: Hematology and Oncology

## 2016-01-18 VITALS — BP 147/63 | HR 97 | Temp 98.5°F | Resp 18 | Ht 64.5 in | Wt 179.6 lb

## 2016-01-18 DIAGNOSIS — D472 Monoclonal gammopathy: Secondary | ICD-10-CM | POA: Diagnosis not present

## 2016-01-18 DIAGNOSIS — K760 Fatty (change of) liver, not elsewhere classified: Secondary | ICD-10-CM | POA: Diagnosis not present

## 2016-01-18 NOTE — Telephone Encounter (Signed)
Gave pt cal & avs °

## 2016-01-18 NOTE — Progress Notes (Signed)
Erika Bolton OFFICE PROGRESS NOTE  Patient Care Team: Erika Orn, MD as PCP - General (Internal Medicine) Erika Campbell, MD as Consulting Physician (Gastroenterology)  SUMMARY OF ONCOLOGIC HISTORY: Erika Bolton was transferred to my care after her prior physician has left.  I reviewed the patient's records extensive and collaborated the history with the patient. Summary of her history is as follows: This patient was discovered to have MGUS after she was found to have abnormal blood tests. She was observed. She denies atypical infection. Denies bone fractures or abnormal bone pain.  INTERVAL HISTORY: Please see below for problem oriented charting. She feels well. Denies recent infection. She has occasional joint pain in her knees or hip. She denies recent bone fractures. Appetite is stable, denies recent weight loss. The patient denies any recent signs or symptoms of bleeding such as spontaneous epistaxis, hematuria or hematochezia. She is compliant taking vitamin D supplement  REVIEW OF SYSTEMS:   Constitutional: Denies fevers, chills or abnormal weight loss Eyes: Denies blurriness of vision Ears, nose, mouth, throat, and face: Denies mucositis or sore throat Respiratory: Denies cough, dyspnea or wheezes Cardiovascular: Denies palpitation, chest discomfort or lower extremity swelling Gastrointestinal:  Denies nausea, heartburn or change in bowel habits Skin: Denies abnormal skin rashes Lymphatics: Denies new lymphadenopathy or easy bruising Neurological:Denies numbness, tingling or new weaknesses Behavioral/Psych: Mood is stable, no new changes  All other systems were reviewed with the patient and are negative.  I have reviewed the past medical history, past surgical history, social history and family history with the patient and they are unchanged from previous note.  ALLERGIES:  is allergic to penicillins.  MEDICATIONS:  Current Outpatient Prescriptions   Medication Sig Dispense Refill  . aspirin 81 MG tablet Take 81 mg by mouth daily.    . Azelaic Acid (FINACEA) 15 % FOAM Apply topically daily.    . carboxymethylcellulose (REFRESH PLUS) 0.5 % SOLN Place 1 drop into both eyes daily.    . cholecalciferol (VITAMIN D) 1000 UNITS tablet Take 1,000 Units by mouth daily.    . famotidine (PEPCID) 10 MG tablet Take 10 mg by mouth as needed.     . fluticasone (FLONASE) 50 MCG/ACT nasal spray Place 1 spray into both nostrils daily as needed.     . sodium chloride (OCEAN) 0.65 % SOLN nasal spray Place 1 spray into both nostrils daily as needed for congestion.    . Sulfacetamide in Bakuchiol (SODIUM SULFACETAMIDE WASH) 10 % LIQD Apply topically daily.    Marland Kitchen tretinoin microspheres (RETIN-A MICRO) 0.04 % gel Apply 0.025 % topically every other day. Uses twice a week     No current facility-administered medications for this visit.    PHYSICAL EXAMINATION: ECOG PERFORMANCE STATUS: 0 - Asymptomatic  Filed Vitals:   01/18/16 1509  BP: 147/63  Pulse: 97  Temp: 98.5 F (36.9 C)  Resp: 18   Filed Weights   01/18/16 1509  Weight: 179 lb 9.6 oz (81.466 kg)    GENERAL:alert, no distress and comfortable SKIN: skin color, texture, turgor are normal, no rashes or significant lesions EYES: normal, Conjunctiva are pink and non-injected, sclera clear Musculoskeletal:no cyanosis of digits and no clubbing  NEURO: alert & oriented x 3 with fluent speech, no focal motor/sensory deficits  LABORATORY DATA:  I have reviewed the data as listed    Component Value Date/Time   NA 142 01/12/2016 1547   NA 140 09/23/2014 0826   K 3.7 01/12/2016 1547   K  3.9 09/23/2014 0826   CL 106 09/23/2014 0826   CL 109* 01/10/2013 1446   CO2 25 01/12/2016 1547   CO2 23 09/23/2014 0826   GLUCOSE 78 01/12/2016 1547   GLUCOSE 191* 09/23/2014 0826   GLUCOSE 116* 01/10/2013 1446   BUN 14.6 01/12/2016 1547   BUN 19 09/23/2014 0826   CREATININE 0.9 01/12/2016 1547    CREATININE 0.78 09/23/2014 0826   CREATININE 0.65 08/05/2007 0816   CALCIUM 10.3 01/12/2016 1547   CALCIUM 9.8 09/23/2014 0826   PROT 7.0 01/12/2016 1547   PROT 7.4 01/12/2016 1547   PROT 8.0 09/23/2014 0826   ALBUMIN 3.9 01/12/2016 1547   ALBUMIN 4.6 09/23/2014 0826   AST 61* 01/12/2016 1547   AST 50* 09/23/2014 0826   ALT 102* 01/12/2016 1547   ALT 70* 09/23/2014 0826   ALKPHOS 93 01/12/2016 1547   ALKPHOS 81 09/23/2014 0826   BILITOT 0.41 01/12/2016 1547   BILITOT 0.9 09/23/2014 0826   GFRNONAA >90 09/23/2014 0826   GFRAA >90 09/23/2014 0826    No results found for: SPEP, UPEP  Lab Results  Component Value Date   WBC 7.6 01/12/2016   NEUTROABS 4.8 01/12/2016   HGB 13.7 01/12/2016   HCT 40.8 01/12/2016   MCV 88.8 01/12/2016   PLT 249 01/12/2016      Chemistry      Component Value Date/Time   NA 142 01/12/2016 1547   NA 140 09/23/2014 0826   K 3.7 01/12/2016 1547   K 3.9 09/23/2014 0826   CL 106 09/23/2014 0826   CL 109* 01/10/2013 1446   CO2 25 01/12/2016 1547   CO2 23 09/23/2014 0826   BUN 14.6 01/12/2016 1547   BUN 19 09/23/2014 0826   CREATININE 0.9 01/12/2016 1547   CREATININE 0.78 09/23/2014 0826   CREATININE 0.65 08/05/2007 0816      Component Value Date/Time   CALCIUM 10.3 01/12/2016 1547   CALCIUM 9.8 09/23/2014 0826   ALKPHOS 93 01/12/2016 1547   ALKPHOS 81 09/23/2014 0826   AST 61* 01/12/2016 1547   AST 50* 09/23/2014 0826   ALT 102* 01/12/2016 1547   ALT 70* 09/23/2014 0826   BILITOT 0.41 01/12/2016 1547   BILITOT 0.9 09/23/2014 0826       RADIOGRAPHIC STUDIES:I reviewed skeletal survey with the patient. The bone lesions are not consistent with multiple myeloma I have personally reviewed the radiological images as listed and agreed with the findings in the report.    ASSESSMENT & PLAN:  MGUS (monoclonal gammopathy of unknown significance) Her blood work is stable with no signs of progression, renal failure, hypercalcemia or  anemia Clinically, she has no signs of disease progression. I will see her on a yearly basis with history, physical examination, blood work and imaging study every 2 years to be done 1 week ahead of time prior to her next year return visit. I emphasized the importance of vitamin D supplements and annual influenza vaccination.  Fatty infiltration of liver Her recent CT scan done in 2016 showed fatty liver congestion with associated abnormal liver function tests. Recommend she refrain from drinking alcohol on a consistent basis. She is being followed closely by primary care doctor and GI service. I also recommend weight loss     Orders Placed This Encounter  Procedures  . CBC with Differential/Platelet    Standing Status: Future     Number of Occurrences:      Standing Expiration Date: 02/21/2017  . Comprehensive metabolic panel  Standing Status: Future     Number of Occurrences:      Standing Expiration Date: 02/21/2017  . Kappa/lambda light chains    Standing Status: Future     Number of Occurrences:      Standing Expiration Date: 02/21/2017  . Multiple Myeloma Panel (SPEP&IFE w/QIG)    Standing Status: Future     Number of Occurrences:      Standing Expiration Date: 02/21/2017   All questions were answered. The patient knows to call the clinic with any problems, questions or concerns. No barriers to learning was detected. I spent 15 minutes counseling the patient face to face. The total time spent in the appointment was 20 minutes and more than 50% was on counseling and review of test results     [facility], [place], MD 01/18/2016 3:31 PM

## 2016-01-18 NOTE — Assessment & Plan Note (Signed)
Her blood work is stable with no signs of progression, renal failure, hypercalcemia or anemia Clinically, she has no signs of disease progression. I will see her on a yearly basis with history, physical examination, blood work and imaging study every 2 years to be done 1 week ahead of time prior to her next year return visit. I emphasized the importance of vitamin D supplements and annual influenza vaccination.

## 2016-01-18 NOTE — Assessment & Plan Note (Signed)
Her recent CT scan done in 2016 showed fatty liver congestion with associated abnormal liver function tests. Recommend she refrain from drinking alcohol on a consistent basis. She is being followed closely by primary care doctor and GI service. I also recommend weight loss

## 2016-02-15 ENCOUNTER — Encounter: Payer: Self-pay | Admitting: Obstetrics & Gynecology

## 2016-04-05 ENCOUNTER — Encounter: Payer: Self-pay | Admitting: Neurology

## 2016-04-05 ENCOUNTER — Ambulatory Visit (INDEPENDENT_AMBULATORY_CARE_PROVIDER_SITE_OTHER): Payer: 59 | Admitting: Neurology

## 2016-04-05 VITALS — BP 140/86 | HR 88 | Resp 16 | Ht 64.5 in | Wt 179.0 lb

## 2016-04-05 DIAGNOSIS — Z789 Other specified health status: Secondary | ICD-10-CM | POA: Diagnosis not present

## 2016-04-05 DIAGNOSIS — Z9989 Dependence on other enabling machines and devices: Secondary | ICD-10-CM

## 2016-04-05 DIAGNOSIS — G4733 Obstructive sleep apnea (adult) (pediatric): Secondary | ICD-10-CM

## 2016-04-05 NOTE — Progress Notes (Signed)
Subjective:    Patient ID: Erika Bolton is a 60 y.o. female.  HPI     Interim history:  Erika Bolton is a 60 year old right-handed woman with an underlying medical history of colonic polyps, left bundle branch block, plantar fasciatis s/p surgery 3 years ago, MGUS, and overweight state, who presents for follow-up consultation of her obstructive sleep apnea, on treatment with CPAP. The patient is unaccompanied today. I last saw her on 12/08/2015, at which time she was compliant with CPAP therapy but reported not sleeping very well, reported nasal congestion, had some jaw pain in the front and chest pain, worried about her CPAP mask causing this pain, did not endorse grinding her teeth, was using nasal pillows. I suggested she try a nasal mask. She was also encouraged to try a nasal rinse system for nasal congestion. She did not endorse much in the way of allergy symptoms.  Today, 04/05/2016: I reviewed her CPAP compliance data from 03/05/2016 through 04/03/2016 which is a total of 30 days, during which time she used her machine every night with percent used days greater than 4 hours at 90%, indicating excellent compliance with an average usage for all nights of 5 hours and 37 minutes, residual AHI low at 0.1 per hour, leak low with the 95th percentile at 2.7 L/m on a pressure of 5 cm with EPR of 1.  Today, 04/05/2016: She reports that she saw her dentist for her tooth discomfort after starting CPAP therapy. She has intermittent allergy symptoms. She reports, that her dentist told her she clenches her teeth, which causes this pressure like sensation, and recommended trial of oral appliance. Saw another specialist for receding gums. He also recommended trying the oral appliance. She would like to pursue this in lieu of CPAP. Still uses nasal pillows, did not try another mask.  Previously:   I saw her on 06/08/2015, at which time she reported difficulty with CPAP tolerance. She did have some dysfunction  of her machine which contributed to this and she got a new machine. However the new machine was louder than the first one. She was compliant with treatment and commended for this. She was working full-time. She did gain a little bit of weight and was motivated to work on weight loss.   I reviewed her CPAP compliance data from 11/07/2015 through 12/06/2015, which is a total of 30 days during which time she used her machine every night with percent used days greater than 4 hours at 97%, indicating excellent compliance with an average usage of 6 hours and 1 minute, residual AHI very low at 0.1 per hour, leak very low at 0.7 L/m for the 95th percentile, pressure at 5 cm with EPR of 1.   I saw her on 12/01/2014, at which time she reported that she was still adjusting to treatment. She had difficulty in the beginning. She was able to sleep a little bit more. She noted improvement in her nocturia and was overall willing to continue with treatment. She was compliant with treatment and was commended for this and encouraged to continue.   I reviewed her CPAP compliance data from 05/02/2015 through 05/31/2015 which is a total of 30 days during which time she used her machine 27 days with percent used days greater than 4 hours at 87%, indicating very good compliance with an average usage of 6 hours and 12 minutes, residual AHI low at 0.3 per hour, leaked low. Pressure 5 cm with EPR of 1.   I  first met her on 07/17/2014 at the request of her primary care provider, at which time the patient reported snoring and witnessed apneas while asleep, as well as morning headaches and nocturia. I invited her back for sleep study. She had a baseline sleep study on 08/24/2014 as well as a CPAP titration study on 09/27/2014. I went over her test results with her in detail today. Her baseline sleep study from 08/24/2014 showed a sleep efficiency of 78.8% with a normal sleep latency and wake after sleep onset of 72 minutes with moderate  sleep fragmentation noted. She had a mildly increased percentage of stage I and stage II sleep, and a reduced percentage of REM sleep with a normal REM latency. She had no significant PLMS. She had mild to moderate and at times louder snoring. She slept mostly on her sides. Her total AHI was 10.4 per hour, rising to 33 per hour during REM sleep. Average oxygen saturation was 94%, nadir was 75%. Based on her test results I invited her back for CPAP titration overnight. Her study was on 09/27/2014. She had a sleep efficiency of 72.4% with a prolonged sleep latency and wake after sleep onset of 83 minutes with overall mild sleep fragmentation noted. She had a normal arousal index. She had a normal percentage of stage I and stage II sleep. She had normal deep sleep, and a normal percentage of REM sleep with a normal REM latency. She had borderline periodic leg movements at 5.7 per hour, with no significant arousals. She was titrated on CPAP from 5-6 cm. Her AHI was 0 per hour on a pressure of 5 cm. Supine REM sleep was not achieved. Her average oxygen saturation was 95%, nadir was 92%. Based on the test results are prescribed CPAP therapy for home use.   I reviewed her CPAP compliance data from 10/31/2014 through 11/29/2014 which is a total of 30 days during which time she used her machine every night with percent used days greater than 4 hours at 90%, indicating excellent compliance with an average usage of 6 hours and 47 minutes, residual AHI low at 0.3 per hour, leaked low with the 95th percentile at 1.2 L/m on a pressure of 5 cm with EPR of 1.     She reports snoring and witnessed apneas, per husband. This has been ongoing for about 6 months. She has had morning HAs, and has nocturia 1-2 per night. She has sleep disruption. She denies a FHx of OSA, and has no RLS Sx and is not known to kick in her sleep. Her bedtime is 10 PM and she falls asleep quickly. She typically wakes up in the early morning hours around  3 AM and between 3 AM and wake time of 6 AM she goes in and out of light sleep. She does not wake up rested and feels groggy first thing in the morning. She does not watch TV in bed. She drinks 1-1-1/2 cups of coffee per day. She drinks 1-1-1/2 glasses of wine in the evenings. She does not smoke. 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 She does not nap. Her Epworth sleepiness score is 5 out of 24 today.  Her Past Medical History Is Significant For: Past Medical History:  Diagnosis Date  . Colon polyps 2003  . Kidney stone 2016  . Left bundle branch block 2012  . Menorrhagia   . MGUS (monoclonal gammopathy of unknown significance)   . Plantar fasciitis   .  Precancerous lesion 1987   mole excised from vulva    Her Past Surgical History Is Significant For: Past Surgical History:  Procedure Laterality Date  . ABLATION  11/10/02   Hysteroscopic Thermal  . cyst on scalp     sebaccous(multiple times as a teen)  . gastro surgery  2012  . LAPAROSCOPIC CHOLECYSTECTOMY  11/2001  . left leg muscle surgery  2012  . OTHER SURGICAL HISTORY     Sebaceous cyst on head removed 7x's    Her Family History Is Significant For: Family History  Problem Relation Age of Onset  . Colon cancer Father 18  . Alzheimer's disease Father   . Emphysema Father   . COPD Father   . Heart attack Mother   . Cancer Brother     peritod gland cancer  . Breast cancer Maternal Aunt     post menopause    Her Social History Is Significant For: Social History   Social History  . Marital status: Married    Spouse name: N/A  . Number of children: 2  . Years of education: M.ED.   Occupational History  .      [place] History Main Topics  . Smoking status: Never Smoker  . Smokeless tobacco: Never Used  . Alcohol use 3.0 - 4.2 oz/week    5 - 7 Glasses of wine per week  . Drug use: No  . Sexual activity: Yes    Partners: Male    Birth  control/ protection: Post-menopausal   Other Topics Concern  . None   Social History Narrative   2 cups of coffee a day     Her Allergies Are:  Allergies  Allergen Reactions  . Penicillins Swelling    Throat swelling  :   Her Current Medications Are:  Outpatient Encounter Prescriptions as of 04/05/2016  Medication Sig  . aspirin 81 MG tablet Take 81 mg by mouth daily.  . Azelaic Acid (FINACEA) 15 % FOAM Apply topically daily.  . carboxymethylcellulose (REFRESH PLUS) 0.5 % SOLN Place 1 drop into both eyes daily.  . cholecalciferol (VITAMIN D) 1000 UNITS tablet Take 1,000 Units by mouth daily.  . famotidine (PEPCID) 10 MG tablet Take 10 mg by mouth as needed.   . fluticasone (FLONASE) 50 MCG/ACT nasal spray Place 1 spray into both nostrils daily as needed.   . sodium chloride (OCEAN) 0.65 % SOLN nasal spray Place 1 spray into both nostrils daily as needed for congestion.  . Sulfacetamide in Bakuchiol (SODIUM SULFACETAMIDE WASH) 10 % LIQD Apply topically daily.  Marland Kitchen tretinoin microspheres (RETIN-A MICRO) 0.04 % gel Apply 0.025 % topically every other day. Uses twice a week   No facility-administered encounter medications on file as of 04/05/2016.   :  Review of Systems:  Out of a complete 14 point review of systems, all are reviewed and negative with the exception of these symptoms as listed below: Review of Systems  Neurological:       Patient would like to discuss using her CPAP machine. She still has pressure on her top teeth. She has seen a dentist about this.     Objective:  Neurologic Exam  Physical Exam Physical Examination:   Vitals:   04/05/16 1506  BP: 140/86  Pulse: 88  Resp: 16   General Examination: The patient is a very pleasant 60 y.o. female in no acute distress. She appears well-developed and well-nourished and well groomed.   HEENT: Normocephalic, atraumatic, pupils  are equal, round and reactive to light and accommodation. Extraocular tracking is good  without limitation to gaze excursion or nystagmus noted. Normal smooth pursuit is noted. Hearing is grossly intact. Face is symmetric with normal facial animation and normal facial sensation. Speech is clear with no dysarthria noted. There is no hypophonia. There is no lip, neck/head, jaw or voice tremor. Oropharynx exam reveals: mild mouth dryness, good dental hygiene and mild to moderate airway crowding, due to narrow airway entry, mildly enlarged uvula, slightly longer tongue and tonsils in place of 1+ in size bilaterally. Mallampati is class II. Tongue protrudes centrally and palate elevates symmetrically. She has a very tiny overbite.Teeth and gums look healthy, nostrils not irritated, no sores, maybe mild erythema in the nasal passages, right more than left but no significant swelling, no drainage.  Chest: Clear to auscultation without wheezing, rhonchi or crackles noted.  Heart: S1+S2+0, regular and normal without murmurs, rubs or gallops noted.   Abdomen: Soft, non-tender and non-distended with normal bowel sounds appreciated on auscultation.  Extremities: There is no pitting edema in the distal lower extremities bilaterally. Pedal pulses are intact.  Skin: Warm and dry without trophic changes noted. There are no varicose veins.  Musculoskeletal: exam reveals no obvious joint deformities, tenderness or joint swelling or erythema.   Neurologically:  Mental status: The patient is awake, alert and oriented in all 4 spheres. Her immediate and remote memory, attention, language skills and fund of knowledge are appropriate. There is no evidence of aphasia, agnosia, apraxia or anomia. Speech is clear with normal prosody and enunciation. Thought process is linear. Mood is normal and affect is normal.  Cranial nerves II - XII are as described above under HEENT exam. In addition: shoulder shrug is normal with equal shoulder height noted. Motor exam: Normal bulk, strength and tone is noted. Fine motor  skills and coordination: intact.  Sensory exam: intact to light touch in the upper and lower extremities.  Gait, station and balance: She stands easily. No veering to one side is noted. No leaning to one side is noted. Posture is age-appropriate and stance is narrow based. Gait shows normal stride length and normal pace. No problems turning are noted.   Assessment and Plan:   In summary, Erika Bolton is a very pleasant 60 year old female with an underlying medical history of colonic polyps, left bundle branch block, plantar fasciatis s/p surgery 3 years ago, MGUS, and overweight state, who presents for follow-up consultation of her obstructive sleep apnea, on treatment with CPAP at 5 cm water pressure. She has been compliant with treatment but has noted problems with pressure on her front teeth, also intermittent nasal congestion due to allergy, has seen her dentist for these issues and was told that she was clenching her teeth. She may be a candidate for an oral appliance. She would like to pursue this. To that end, I made a referral. She decided that she would like to see Dr. Augustina Mood. We talked about  her sleep study results again today. She had a baseline sleep study on 08/24/2014 and a CPAP titration study on 09/27/2014 and there were some telltale differences and  improvements with CPAP therapy as opposed to without. She is advised to  to follow-up with me as needed at this time, if she decides to continue with CPAP therapy, I suggested she make an appointment for me in 6-12 months. I answered all her questions today and she was in agreement. I spent 25 minutes  in total face-to-face time with the patient, more than 50% of which was spent in counseling and coordination of care, reviewing test results, reviewing medication and discussing or reviewing the diagnosis of OSA, its prognosis and treatment options.

## 2016-04-05 NOTE — Patient Instructions (Addendum)
You may be a good candidate for an oral appliance for sleep apnea. As discussed, we will refer you to Dr. Augustina Mood.  I will see you back as needed. If you decide to continue with CPAP, I can see you back in 6 months or 12 months, as you have done well with compliance and your apnea score is nearly 0.

## 2016-06-07 ENCOUNTER — Ambulatory Visit: Payer: 59 | Admitting: Neurology

## 2016-08-01 DIAGNOSIS — Z803 Family history of malignant neoplasm of breast: Secondary | ICD-10-CM | POA: Diagnosis not present

## 2016-08-01 DIAGNOSIS — Z1231 Encounter for screening mammogram for malignant neoplasm of breast: Secondary | ICD-10-CM | POA: Diagnosis not present

## 2016-08-10 ENCOUNTER — Encounter: Payer: Self-pay | Admitting: Obstetrics & Gynecology

## 2016-08-16 ENCOUNTER — Ambulatory Visit: Payer: 59 | Admitting: Nurse Practitioner

## 2016-08-18 ENCOUNTER — Encounter: Payer: Self-pay | Admitting: Nurse Practitioner

## 2016-08-18 NOTE — Progress Notes (Deleted)
Patient ID: Erika Bolton, female   DOB: [birth year], 61 y.o.   MRN: [id number]  61 y.o. G2P2002 Married  Caucasian Fe here for annual exam.    Patient's last menstrual period was 08/01/2007 (approximate).          Sexually active: Yes.    The current method of family planning is {contraception:315051}.    Exercising: Yes.    {types:19826} Smoker:  no  Health Maintenance: Pap:08/03/15, Negative with + HR HPV, neg 16/18/45 MMG:08/01/16, 3D, Bi-Rads 2: Benign Colonoscopy: 2014 repeat 5 years - Per pt BMD:07/19/15, T Score: -1.20 Spine / -1.20 Right Femur Neck / -1.00 Left Femur Neck, follow up in 2-3 years TDaP:02/24/12 Hep C and HIV: 08/03/15 Labs: Vit D, Oncology follows others   reports that she has never smoked. She has never used smokeless tobacco. She reports that she drinks about 3.0 - 4.2 oz of alcohol per week . She reports that she does not use drugs.  Past Medical History:  Diagnosis Date  . Colon polyps 2003  . Kidney stone 2016  . Left bundle branch block 2012  . Menorrhagia   . MGUS (monoclonal gammopathy of unknown significance)   . Plantar fasciitis   . Precancerous lesion 1987   mole excised from vulva    Past Surgical History:  Procedure Laterality Date  . ABLATION  11/10/02   Hysteroscopic Thermal  . cyst on scalp     sebaccous(multiple times as a teen)  . gastro surgery  2012  . LAPAROSCOPIC CHOLECYSTECTOMY  11/2001  . left leg muscle surgery  2012  . OTHER SURGICAL HISTORY     Sebaceous cyst on head removed 7x's    Current Outpatient Prescriptions  Medication Sig Dispense Refill  . aspirin 81 MG tablet Take 81 mg by mouth daily.    . Azelaic Acid (FINACEA) 15 % FOAM Apply topically daily.    . carboxymethylcellulose (REFRESH PLUS) 0.5 % SOLN Place 1 drop into both eyes daily.    . cholecalciferol (VITAMIN D) 1000 UNITS tablet Take 1,000 Units by mouth daily.    . famotidine (PEPCID) 10 MG tablet Take 10 mg by mouth as needed.     . fluticasone  (FLONASE) 50 MCG/ACT nasal spray Place 1 spray into both nostrils daily as needed.     . sodium chloride (OCEAN) 0.65 % SOLN nasal spray Place 1 spray into both nostrils daily as needed for congestion.    . Sulfacetamide in Bakuchiol (SODIUM SULFACETAMIDE WASH) 10 % LIQD Apply topically daily.    Marland Kitchen tretinoin microspheres (RETIN-A MICRO) 0.04 % gel Apply 0.025 % topically every other day. Uses twice a week     No current facility-administered medications for this visit.     Family History  Problem Relation Age of Onset  . Colon cancer Father 75  . Alzheimer's disease Father   . Emphysema Father   . COPD Father   . Heart attack Mother   . Cancer Brother     peritod gland cancer  . Breast cancer Maternal Aunt     post menopause    ROS:  Pertinent items are noted in HPI.  Otherwise, a comprehensive ROS was negative.  Exam:   LMP 08/01/2007 (Approximate)    Ht Readings from Last 3 Encounters:  04/05/16 5' 4.5" (1.638 m)  01/18/16 5' 4.5" (1.638 m)  12/08/15 5' 4.5" (1.638 m)    General appearance: alert, cooperative and appears stated age Head: Normocephalic, without obvious abnormality, atraumatic Neck: no  adenopathy, supple, symmetrical, trachea midline and thyroid {EXAM; THYROID:18604} Lungs: clear to auscultation bilaterally Breasts: {Exam; breast:13139::"normal appearance, no masses or tenderness"} Heart: regular rate and rhythm Abdomen: soft, non-tender; no masses,  no organomegaly Extremities: extremities normal, atraumatic, no cyanosis or edema Skin: Skin color, texture, turgor normal. No rashes or lesions Lymph nodes: Cervical, supraclavicular, and axillary nodes normal. No abnormal inguinal nodes palpated Neurologic: Grossly normal   Pelvic: External genitalia:  no lesions              Urethra:  normal appearing urethra with no masses, tenderness or lesions              Bartholin's and Skene's: normal                 Vagina: normal appearing vagina with normal  color and discharge, no lesions              Cervix: {exam; cervix:14595}              Pap taken: {yes no:314532} Bimanual Exam:  Uterus:  {exam; uterus:12215}              Adnexa: {exam; adnexa:12223}               Rectovaginal: Confirms               Anus:  normal sphincter tone, no lesions  Chaperone present: ***  A:  Well Woman with normal exam  P:   Reviewed health and wellness pertinent to exam  Pap smear as above  {plan; gyn:5269::"mammogram","pap smear","return annually or prn"}  An After Visit Summary was printed and given to the patient.

## 2016-08-21 ENCOUNTER — Ambulatory Visit: Payer: 59 | Admitting: Nurse Practitioner

## 2016-08-21 ENCOUNTER — Telehealth: Payer: Self-pay | Admitting: Nurse Practitioner

## 2016-08-21 NOTE — Telephone Encounter (Signed)
She is in pap recall for this year.  Will need to send her to recall (so that if she does not call back to recheck) we can catch this.  Then may close encounter.

## 2016-08-21 NOTE — Telephone Encounter (Signed)
Patient called and left a message on our answering machine over the weekend cancelling her AEX for today due to being "sick."  I called the patient back and confirmed with her we received the message.  She will call back to reschedule when feeling better.  FYI only.

## 2016-08-21 NOTE — Telephone Encounter (Signed)
Recall letter mailed 

## 2016-08-23 DIAGNOSIS — J209 Acute bronchitis, unspecified: Secondary | ICD-10-CM | POA: Diagnosis not present

## 2016-08-23 DIAGNOSIS — R0989 Other specified symptoms and signs involving the circulatory and respiratory systems: Secondary | ICD-10-CM | POA: Diagnosis not present

## 2016-08-23 DIAGNOSIS — R05 Cough: Secondary | ICD-10-CM | POA: Diagnosis not present

## 2016-09-07 DIAGNOSIS — H43811 Vitreous degeneration, right eye: Secondary | ICD-10-CM | POA: Diagnosis not present

## 2016-09-11 ENCOUNTER — Encounter: Payer: Self-pay | Admitting: Nurse Practitioner

## 2016-09-11 NOTE — Progress Notes (Addendum)
Patient ID: Erika Bolton, female   DOB: [birth year], 61 y.o.   MRN: [id number]  61 y.o. G94P2002 Married  Caucasian Fe here for annual exam.  No new health problems other than a vitreous detachment of her right eye.  Her daughter is expecting a boy in April in [place] and they will be going down there more often to spend time.  Patient's last menstrual period was 08/01/2007 (approximate).          Sexually active: Yes.    The current method of family planning is post menopausal status.    Exercising: Yes.    walkig Smoker:  no  Health Maintenance: Pap: 08/03/15, Negative with pos HR HPV with negative # 16/18/45  12/10/12, Negative with neg HR HPV MMG:08/01/16, 3D, Bi-Rads 2: Benign Colonoscopy: 2014, normal per patient, repeat in 5 years due to family history of colon cancer BMD:07/19/15, T Score: -1.20 Spine / -1.20 Right Femur Neck / -1.00 Left Femur Neck, follow up in 2-3 years TDaP:02/24/12 Shingles: Not indicated due to age Pneumonia: Not indicated due to age Hep C and HIV: 08/03/15 Labs: PCP takes care of screening labs, we follow Vit D   reports that she has never smoked. She has never used smokeless tobacco. She reports that she drinks about 1.8 oz of alcohol per week . She reports that she does not use drugs.  Past Medical History:  Diagnosis Date  . Colon polyps 2003  . Kidney stone 2016  . Left bundle branch block 2012  . Menorrhagia   . MGUS (monoclonal gammopathy of unknown significance)   . Plantar fasciitis   . Precancerous lesion 1987   mole excised from vulva  . PVD (posterior vitreous detachment), right 09/07/2015    Past Surgical History:  Procedure Laterality Date  . ABLATION  11/10/02   Hysteroscopic Thermal  . cyst on scalp     sebaccous(multiple times as a teen)  . gastro surgery  2012  . LAPAROSCOPIC CHOLECYSTECTOMY  11/2001  . left leg muscle surgery  2012  . OTHER SURGICAL HISTORY     Sebaceous cyst on head removed 7x's    Current Outpatient  Prescriptions  Medication Sig Dispense Refill  . cholecalciferol (VITAMIN D) 1000 UNITS tablet Take 1,000 Units by mouth daily.    . famotidine (PEPCID) 10 MG tablet Take 10 mg by mouth as needed.     . Glycerin-Polysorbate 80 (REFRESH DRY EYE THERAPY OP) Apply 1 drop to eye as needed.    . metroNIDAZOLE (METROGEL) 0.75 % gel Apply topically daily.    . Misc Natural Products (TART CHERRY ADVANCED PO) Take 3 tablets by mouth daily.    . Multiple Vitamins-Minerals (PRESERVISION AREDS 2) CAPS Take 2 capsules by mouth daily.    . Multiple Vitamins-Minerals (WOMENS MULTIVITAMIN PLUS PO) Take 1 tablet by mouth daily.    Marland Kitchen tretinoin microspheres (RETIN-A MICRO) 0.04 % gel Apply 0.025 % topically every other day. Uses twice a week     No current facility-administered medications for this visit.     Family History  Problem Relation Age of Onset  . Colon cancer Father 30  . Alzheimer's disease Father   . Emphysema Father   . COPD Father   . Heart attack Mother   . Cancer Brother     peritod gland cancer  . Breast cancer Maternal Aunt     post menopause    ROS:  Pertinent items are noted in HPI.  Otherwise, a comprehensive ROS was  negative.  Exam:   BP 122/80 (BP Location: Right Arm, Patient Position: Sitting, Cuff Size: Normal)   Pulse 76   Ht 5' 4.25" (1.632 m)   Wt 168 lb (76.2 kg)   LMP 08/01/2007 (Approximate)   BMI 28.61 kg/m  Height: 5' 4.25" (163.2 cm) Ht Readings from Last 3 Encounters:  09/12/16 5' 4.25" (1.632 m)  04/05/16 5' 4.5" (1.638 m)  01/18/16 5' 4.5" (1.638 m)    General appearance: alert, cooperative and appears stated age Head: Normocephalic, without obvious abnormality, atraumatic Neck: no adenopathy, supple, symmetrical, trachea midline and thyroid normal to inspection and palpation Lungs: clear to auscultation bilaterally Breasts: normal appearance, no masses or tenderness Heart: regular rate and rhythm Abdomen: soft, non-tender; no masses,  no  organomegaly Extremities: extremities normal, atraumatic, no cyanosis or edema Skin: Skin color, texture, turgor normal. No rashes or lesions Lymph nodes: Cervical, supraclavicular, and axillary nodes normal. No abnormal inguinal nodes palpated Neurologic: Grossly normal   Pelvic: External genitalia:  no lesions              Urethra:  normal appearing urethra with no masses, tenderness or lesions              Bartholin's and Skene's: normal                 Vagina: normal appearing vagina with normal color and discharge, no lesions              Cervix: anteverted              Pap taken: Yes.   Bimanual Exam:  Uterus:  normal size, contour, position, consistency, mobility, non-tender              Adnexa: no mass, fullness, tenderness               Rectovaginal: Confirms               Anus:  normal sphincter tone, no lesions  Chaperone present: yes  A:  Well Woman with normal exam  Monoclonal gammopathy 07/2007, Dr. Alvy Bimler at the [facility] monitor yearly [place] breast cancer in 2 aunts and colon cancer in father  HRT from 12/2008 and ended in April 2012 History of Vit D deficiency - now on OTC 1000 IU daily  History of normal pap with + HPV and negative # 16/18/45 08/2015  P:   Reviewed health and wellness pertinent to exam  Pap smear was done  Mammogram is due 07/2017  Discussed genetic testing with + [facility] of breast and colon cancer - she will discuss with Oncologist in June at her yearly exam  TDaP is given today  Will follow with Vit D - on OTC 1000 IU daily  Counseled on breast self exam, mammography screening, adequate intake of calcium and vitamin D, diet and exercise, Kegel's exercises return annually or prn  An After Visit Summary was printed and given to the patient.

## 2016-09-12 ENCOUNTER — Ambulatory Visit (INDEPENDENT_AMBULATORY_CARE_PROVIDER_SITE_OTHER): Payer: 59 | Admitting: Nurse Practitioner

## 2016-09-12 ENCOUNTER — Encounter: Payer: Self-pay | Admitting: Nurse Practitioner

## 2016-09-12 VITALS — BP 122/80 | HR 76 | Ht 64.25 in | Wt 168.0 lb

## 2016-09-12 DIAGNOSIS — R87619 Unspecified abnormal cytological findings in specimens from cervix uteri: Secondary | ICD-10-CM | POA: Diagnosis not present

## 2016-09-12 DIAGNOSIS — E559 Vitamin D deficiency, unspecified: Secondary | ICD-10-CM

## 2016-09-12 DIAGNOSIS — Z23 Encounter for immunization: Secondary | ICD-10-CM | POA: Diagnosis not present

## 2016-09-12 DIAGNOSIS — Z01411 Encounter for gynecological examination (general) (routine) with abnormal findings: Secondary | ICD-10-CM | POA: Diagnosis not present

## 2016-09-12 DIAGNOSIS — Z0000 Encounter for general adult medical examination without abnormal findings: Secondary | ICD-10-CM

## 2016-09-12 DIAGNOSIS — Z800 Family history of malignant neoplasm of digestive organs: Secondary | ICD-10-CM

## 2016-09-12 DIAGNOSIS — Z124 Encounter for screening for malignant neoplasm of cervix: Secondary | ICD-10-CM | POA: Diagnosis not present

## 2016-09-12 DIAGNOSIS — Z803 Family history of malignant neoplasm of breast: Secondary | ICD-10-CM

## 2016-09-12 NOTE — Patient Instructions (Signed)

## 2016-09-13 LAB — VITAMIN D 25 HYDROXY (VIT D DEFICIENCY, FRACTURES): Vit D, 25-Hydroxy: 40 ng/mL (ref 30–100)

## 2016-09-15 LAB — IPS PAP TEST WITH HPV

## 2016-09-17 NOTE — Progress Notes (Signed)
Encounter reviewed by Dr. Brook Amundson C. Silva.  

## 2016-09-23 DIAGNOSIS — H33001 Unspecified retinal detachment with retinal break, right eye: Secondary | ICD-10-CM | POA: Diagnosis not present

## 2016-09-23 DIAGNOSIS — H538 Other visual disturbances: Secondary | ICD-10-CM | POA: Diagnosis not present

## 2016-09-23 DIAGNOSIS — H3321 Serous retinal detachment, right eye: Secondary | ICD-10-CM | POA: Diagnosis not present

## 2016-09-23 DIAGNOSIS — Z880 Allergy status to penicillin: Secondary | ICD-10-CM | POA: Diagnosis not present

## 2016-09-24 DIAGNOSIS — H33001 Unspecified retinal detachment with retinal break, right eye: Secondary | ICD-10-CM | POA: Insufficient documentation

## 2016-10-30 DIAGNOSIS — H43813 Vitreous degeneration, bilateral: Secondary | ICD-10-CM | POA: Diagnosis not present

## 2016-10-30 DIAGNOSIS — H33001 Unspecified retinal detachment with retinal break, right eye: Secondary | ICD-10-CM | POA: Diagnosis not present

## 2016-10-30 DIAGNOSIS — H2513 Age-related nuclear cataract, bilateral: Secondary | ICD-10-CM | POA: Diagnosis not present

## 2016-11-16 DIAGNOSIS — H2513 Age-related nuclear cataract, bilateral: Secondary | ICD-10-CM | POA: Diagnosis not present

## 2016-11-16 DIAGNOSIS — H33301 Unspecified retinal break, right eye: Secondary | ICD-10-CM | POA: Diagnosis not present

## 2017-01-09 ENCOUNTER — Other Ambulatory Visit (HOSPITAL_BASED_OUTPATIENT_CLINIC_OR_DEPARTMENT_OTHER): Payer: 59

## 2017-01-09 DIAGNOSIS — D472 Monoclonal gammopathy: Secondary | ICD-10-CM | POA: Diagnosis not present

## 2017-01-09 DIAGNOSIS — K760 Fatty (change of) liver, not elsewhere classified: Secondary | ICD-10-CM | POA: Diagnosis not present

## 2017-01-09 LAB — CBC WITH DIFFERENTIAL/PLATELET
BASO%: 1 % (ref 0.0–2.0)
Basophils Absolute: 0.1 10*3/uL (ref 0.0–0.1)
EOS%: 3.9 % (ref 0.0–7.0)
Eosinophils Absolute: 0.3 10*3/uL (ref 0.0–0.5)
HCT: 41.2 % (ref 34.8–46.6)
HGB: 14 g/dL (ref 11.6–15.9)
LYMPH#: 1.4 10*3/uL (ref 0.9–3.3)
LYMPH%: 21.8 % (ref 14.0–49.7)
MCH: 30.3 pg (ref 25.1–34.0)
MCHC: 34.1 g/dL (ref 31.5–36.0)
MCV: 88.9 fL (ref 79.5–101.0)
MONO#: 0.6 10*3/uL (ref 0.1–0.9)
MONO%: 9.2 % (ref 0.0–14.0)
NEUT#: 4.2 10*3/uL (ref 1.5–6.5)
NEUT%: 64.1 % (ref 38.4–76.8)
Platelets: 255 10*3/uL (ref 145–400)
RBC: 4.63 10*6/uL (ref 3.70–5.45)
RDW: 12.7 % (ref 11.2–14.5)
WBC: 6.5 10*3/uL (ref 3.9–10.3)

## 2017-01-09 LAB — COMPREHENSIVE METABOLIC PANEL
ALT: 31 U/L (ref 0–55)
AST: 27 U/L (ref 5–34)
Albumin: 4 g/dL (ref 3.5–5.0)
Alkaline Phosphatase: 94 U/L (ref 40–150)
Anion Gap: 8 mEq/L (ref 3–11)
BUN: 14.1 mg/dL (ref 7.0–26.0)
CALCIUM: 10.5 mg/dL — AB (ref 8.4–10.4)
CHLORIDE: 109 meq/L (ref 98–109)
CO2: 23 meq/L (ref 22–29)
Creatinine: 0.7 mg/dL (ref 0.6–1.1)
EGFR: >90 mL/min/{1.73_m2} (ref >90)
GLUCOSE: 94 mg/dL (ref 70–140)
Potassium: 4.1 mEq/L (ref 3.5–5.1)
SODIUM: 140 meq/L (ref 136–145)
Total Bilirubin: 0.45 mg/dL (ref 0.20–1.20)
Total Protein: 7.5 g/dL (ref 6.4–8.3)

## 2017-01-10 LAB — KAPPA/LAMBDA LIGHT CHAINS
IG KAPPA FREE LIGHT CHAIN: 17 mg/L (ref 3.3–19.4)
Ig Lambda Free Light Chain: 8.7 mg/L (ref 5.7–26.3)
Kappa/Lambda FluidC Ratio: 1.95 — ABNORMAL HIGH (ref 0.26–1.65)

## 2017-01-11 LAB — MULTIPLE MYELOMA PANEL, SERUM
ALBUMIN SERPL ELPH-MCNC: 3.8 g/dL (ref 2.9–4.4)
Albumin/Glob SerPl: 1.2 (ref 0.7–1.7)
Alpha 1: 0.3 g/dL (ref 0.0–0.4)
Alpha2 Glob SerPl Elph-Mcnc: 0.8 g/dL (ref 0.4–1.0)
B-Globulin SerPl Elph-Mcnc: 1 g/dL (ref 0.7–1.3)
GAMMA GLOB SERPL ELPH-MCNC: 1.2 g/dL (ref 0.4–1.8)
Globulin, Total: 3.3 g/dL (ref 2.2–3.9)
IGA/IMMUNOGLOBULIN A, SERUM: 55 mg/dL — AB (ref 87–352)
IgG, Qn, Serum: 1198 mg/dL (ref 700–1600)
IgM, Qn, Serum: 32 mg/dL (ref 26–217)
M Protein SerPl Elph-Mcnc: 1 g/dL — ABNORMAL HIGH
TOTAL PROTEIN: 7.1 g/dL (ref 6.0–8.5)

## 2017-01-15 ENCOUNTER — Ambulatory Visit: Payer: 59 | Admitting: Hematology and Oncology

## 2017-01-16 ENCOUNTER — Encounter: Payer: Self-pay | Admitting: Hematology and Oncology

## 2017-01-16 ENCOUNTER — Telehealth: Payer: Self-pay | Admitting: Hematology and Oncology

## 2017-01-16 ENCOUNTER — Ambulatory Visit (HOSPITAL_BASED_OUTPATIENT_CLINIC_OR_DEPARTMENT_OTHER): Payer: 59 | Admitting: Hematology and Oncology

## 2017-01-16 VITALS — BP 140/60 | HR 88 | Temp 98.4°F | Resp 18 | Ht 64.25 in | Wt 172.7 lb

## 2017-01-16 DIAGNOSIS — K760 Fatty (change of) liver, not elsewhere classified: Secondary | ICD-10-CM

## 2017-01-16 DIAGNOSIS — D472 Monoclonal gammopathy: Secondary | ICD-10-CM

## 2017-01-16 NOTE — Telephone Encounter (Signed)
Appointments scheduled per 01/16/17 los. Patient was given a copy of the AVS report and appointments schedule, per 01/16/17 los.

## 2017-01-17 NOTE — Assessment & Plan Note (Signed)
Her blood work is stable with no signs of progression, renal failure, hypercalcemia or anemia The mildly elevated calcium is likely unrelated Clinically, she has no signs of disease progression. I will see her on a yearly basis with history, physical examination, blood work to be done 1 week ahead of time prior to her next year return visit. I emphasized the importance of vitamin D supplements and annual influenza vaccination.

## 2017-01-17 NOTE — Progress Notes (Signed)
[facility] OFFICE PROGRESS NOTE  Patient Care Team: Lavone Orn, MD as PCP - General (Internal Medicine) [name], MD as Consulting Physician (Gastroenterology)  SUMMARY OF ONCOLOGIC HISTORY:  Erika Bolton was transferred to my care after her prior physician has left.  I reviewed the patient's records extensive and collaborated the history with the patient. Summary of her history is as follows: This patient was discovered to have MGUS after she was found to have abnormal blood tests. Blood work review IgG kappa She was observed.  INTERVAL HISTORY: Please see below for problem oriented charting. She denies atypical infection. Denies bone fractures or abnormal bone pain She has started to modify her diet and have regular exercise recently  REVIEW OF SYSTEMS:   Constitutional: Denies fevers, chills or abnormal weight loss Eyes: Denies blurriness of vision Ears, nose, mouth, throat, and face: Denies mucositis or sore throat Respiratory: Denies cough, dyspnea or wheezes Cardiovascular: Denies palpitation, chest discomfort or lower extremity swelling Gastrointestinal:  Denies nausea, heartburn or change in bowel habits Skin: Denies abnormal skin rashes Lymphatics: Denies new lymphadenopathy or easy bruising Neurological:Denies numbness, tingling or new weaknesses Behavioral/Psych: Mood is stable, no new changes  All other systems were reviewed with the patient and are negative.  I have reviewed the past medical history, past surgical history, social history and family history with the patient and they are unchanged from previous note.  ALLERGIES:  is allergic to other; penicillins; gold sodium thiosulfate; and nickel.  MEDICATIONS:  Current Outpatient Prescriptions  Medication Sig Dispense Refill  . cholecalciferol (VITAMIN D) 1000 UNITS tablet Take 1,000 Units by mouth daily.    . famotidine (PEPCID) 10 MG tablet Take 10 mg by mouth as needed.     .  Glycerin-Polysorbate 80 (REFRESH DRY EYE THERAPY OP) Apply 1 drop to eye as needed.    . metroNIDAZOLE (METROGEL) 0.75 % gel Apply topically daily.    . Misc Natural Products (TART CHERRY ADVANCED PO) Take 3 tablets by mouth daily.    . Multiple Vitamins-Minerals (PRESERVISION AREDS 2) CAPS Take 2 capsules by mouth daily.    . Multiple Vitamins-Minerals (WOMENS MULTIVITAMIN PLUS PO) Take 1 tablet by mouth daily.    Marland Kitchen tretinoin microspheres (RETIN-A MICRO) 0.04 % gel Apply 0.025 % topically every other day. Uses twice a week     No current facility-administered medications for this visit.     PHYSICAL EXAMINATION: ECOG PERFORMANCE STATUS: 0 - Asymptomatic  Vitals:   01/16/17 1009  BP: 140/60  Pulse: 88  Resp: 18  Temp: 98.4 F (36.9 C)   Filed Weights   01/16/17 1009  Weight: 172 lb 11.2 oz (78.3 kg)    GENERAL:alert, no distress and comfortable SKIN: skin color, texture, turgor are normal, no rashes or significant lesions EYES: normal, Conjunctiva are pink and non-injected, sclera clear Musculoskeletal:no cyanosis of digits and no clubbing  NEURO: alert & oriented x 3 with fluent speech, no focal motor/sensory deficits  LABORATORY DATA:  I have reviewed the data as listed    Component Value Date/Time   NA 140 01/09/2017 1523   K 4.1 01/09/2017 1523   CL 106 09/23/2014 0826   CL 109 (H) 01/10/2013 1446   CO2 23 01/09/2017 1523   GLUCOSE 94 01/09/2017 1523   GLUCOSE 116 (H) 01/10/2013 1446   BUN 14.1 01/09/2017 1523   CREATININE 0.7 01/09/2017 1523   CALCIUM 10.5 (H) 01/09/2017 1523   PROT 7.1 01/09/2017 1523   PROT 7.5  01/09/2017 1523   ALBUMIN 4.0 01/09/2017 1523   AST 27 01/09/2017 1523   ALT 31 01/09/2017 1523   ALKPHOS 94 01/09/2017 1523   BILITOT 0.45 01/09/2017 1523   GFRNONAA >90 09/23/2014 0826   GFRAA >90 09/23/2014 0826    No results found for: SPEP, UPEP  Lab Results  Component Value Date   WBC 6.5 01/09/2017   NEUTROABS 4.2 01/09/2017   HGB  14.0 01/09/2017   HCT 41.2 01/09/2017   MCV 88.9 01/09/2017   PLT 255 01/09/2017      Chemistry      Component Value Date/Time   NA 140 01/09/2017 1523   K 4.1 01/09/2017 1523   CL 106 09/23/2014 0826   CL 109 (H) 01/10/2013 1446   CO2 23 01/09/2017 1523   BUN 14.1 01/09/2017 1523   CREATININE 0.7 01/09/2017 1523      Component Value Date/Time   CALCIUM 10.5 (H) 01/09/2017 1523   ALKPHOS 94 01/09/2017 1523   AST 27 01/09/2017 1523   ALT 31 01/09/2017 1523   BILITOT 0.45 01/09/2017 1523     ASSESSMENT & PLAN:  MGUS (monoclonal gammopathy of unknown significance) Her blood work is stable with no signs of progression, renal failure, hypercalcemia or anemia The mildly elevated calcium is likely unrelated Clinically, she has no signs of disease progression. I will see her on a yearly basis with history, physical examination, blood work to be done 1 week ahead of time prior to her next year return visit. I emphasized the importance of vitamin D supplements and annual influenza vaccination.  Fatty infiltration of liver Since she has had a recent regular exercise, the elevated liver enzymes had resolved I suspect her fatty liver disease has improved I encouraged her to continue exercise program and weight loss as tolerated  Hypercalcemia She has very mild hypercalcemia but I do not believe this is due to multiple myeloma I suspect she might be dehydrated at the time of blood draw I recommend increase hydration as tolerated   Orders Placed This Encounter  Procedures  . Comprehensive metabolic panel    Standing Status:   Future    Standing Expiration Date:   02/20/2018  . CBC with Differential/Platelet    Standing Status:   Future    Standing Expiration Date:   02/20/2018  . Kappa/lambda light chains    Standing Status:   Future    Standing Expiration Date:   02/20/2018  . Multiple Myeloma Panel (SPEP&IFE w/QIG)    Standing Status:   Future    Standing Expiration Date:    02/20/2018   All questions were answered. The patient knows to call the clinic with any problems, questions or concerns. No barriers to learning was detected. I spent 15 minutes counseling the patient face to face. The total time spent in the appointment was 20 minutes and more than 50% was on counseling and review of test results     Heath Lark, MD 01/17/2017 4:50 PM

## 2017-01-17 NOTE — Assessment & Plan Note (Signed)
She has very mild hypercalcemia but I do not believe this is due to multiple myeloma I suspect she might be dehydrated at the time of blood draw I recommend increase hydration as tolerated

## 2017-01-17 NOTE — Assessment & Plan Note (Signed)
Since she has had a recent regular exercise, the elevated liver enzymes had resolved I suspect her fatty liver disease has improved I encouraged her to continue exercise program and weight loss as tolerated

## 2017-02-12 DIAGNOSIS — N200 Calculus of kidney: Secondary | ICD-10-CM | POA: Diagnosis not present

## 2017-03-05 DIAGNOSIS — H33001 Unspecified retinal detachment with retinal break, right eye: Secondary | ICD-10-CM | POA: Diagnosis not present

## 2017-03-05 DIAGNOSIS — H43813 Vitreous degeneration, bilateral: Secondary | ICD-10-CM | POA: Diagnosis not present

## 2017-03-05 DIAGNOSIS — H2513 Age-related nuclear cataract, bilateral: Secondary | ICD-10-CM | POA: Diagnosis not present

## 2017-05-19 DIAGNOSIS — Z23 Encounter for immunization: Secondary | ICD-10-CM | POA: Diagnosis not present

## 2017-05-22 DIAGNOSIS — M7502 Adhesive capsulitis of left shoulder: Secondary | ICD-10-CM | POA: Diagnosis not present

## 2017-06-01 DIAGNOSIS — M7502 Adhesive capsulitis of left shoulder: Secondary | ICD-10-CM | POA: Diagnosis not present

## 2017-06-04 DIAGNOSIS — M7502 Adhesive capsulitis of left shoulder: Secondary | ICD-10-CM | POA: Diagnosis not present

## 2017-06-06 DIAGNOSIS — M7502 Adhesive capsulitis of left shoulder: Secondary | ICD-10-CM | POA: Diagnosis not present

## 2017-06-07 DIAGNOSIS — D225 Melanocytic nevi of trunk: Secondary | ICD-10-CM | POA: Diagnosis not present

## 2017-06-07 DIAGNOSIS — D2261 Melanocytic nevi of right upper limb, including shoulder: Secondary | ICD-10-CM | POA: Diagnosis not present

## 2017-06-07 DIAGNOSIS — L821 Other seborrheic keratosis: Secondary | ICD-10-CM | POA: Diagnosis not present

## 2017-06-11 DIAGNOSIS — M7502 Adhesive capsulitis of left shoulder: Secondary | ICD-10-CM | POA: Diagnosis not present

## 2017-06-14 DIAGNOSIS — M7502 Adhesive capsulitis of left shoulder: Secondary | ICD-10-CM | POA: Diagnosis not present

## 2017-06-19 DIAGNOSIS — M7502 Adhesive capsulitis of left shoulder: Secondary | ICD-10-CM | POA: Diagnosis not present

## 2017-06-25 DIAGNOSIS — M7502 Adhesive capsulitis of left shoulder: Secondary | ICD-10-CM | POA: Diagnosis not present

## 2017-06-26 DIAGNOSIS — M7502 Adhesive capsulitis of left shoulder: Secondary | ICD-10-CM | POA: Diagnosis not present

## 2017-06-29 DIAGNOSIS — M7502 Adhesive capsulitis of left shoulder: Secondary | ICD-10-CM | POA: Diagnosis not present

## 2017-07-02 ENCOUNTER — Telehealth: Payer: Self-pay | Admitting: Certified Nurse Midwife

## 2017-07-02 NOTE — Telephone Encounter (Signed)
Patient is scheduled for bone density on dec 6 and will need an order faxed to [facility].

## 2017-07-02 NOTE — Telephone Encounter (Signed)
BMD order to Melvia Heaps CNM desk for review and signature before fax to [facility].

## 2017-07-04 DIAGNOSIS — M7502 Adhesive capsulitis of left shoulder: Secondary | ICD-10-CM | POA: Diagnosis not present

## 2017-07-05 DIAGNOSIS — M85851 Other specified disorders of bone density and structure, right thigh: Secondary | ICD-10-CM | POA: Diagnosis not present

## 2017-07-06 DIAGNOSIS — M7502 Adhesive capsulitis of left shoulder: Secondary | ICD-10-CM | POA: Diagnosis not present

## 2017-07-11 DIAGNOSIS — M7502 Adhesive capsulitis of left shoulder: Secondary | ICD-10-CM | POA: Diagnosis not present

## 2017-07-16 DIAGNOSIS — M7502 Adhesive capsulitis of left shoulder: Secondary | ICD-10-CM | POA: Diagnosis not present

## 2017-07-16 DIAGNOSIS — D472 Monoclonal gammopathy: Secondary | ICD-10-CM | POA: Diagnosis not present

## 2017-07-16 DIAGNOSIS — K219 Gastro-esophageal reflux disease without esophagitis: Secondary | ICD-10-CM | POA: Diagnosis not present

## 2017-07-16 DIAGNOSIS — Z0000 Encounter for general adult medical examination without abnormal findings: Secondary | ICD-10-CM | POA: Diagnosis not present

## 2017-07-16 DIAGNOSIS — G4733 Obstructive sleep apnea (adult) (pediatric): Secondary | ICD-10-CM | POA: Diagnosis not present

## 2017-07-20 DIAGNOSIS — M7502 Adhesive capsulitis of left shoulder: Secondary | ICD-10-CM | POA: Diagnosis not present

## 2017-07-25 DIAGNOSIS — Z23 Encounter for immunization: Secondary | ICD-10-CM | POA: Diagnosis not present

## 2017-07-26 DIAGNOSIS — M7502 Adhesive capsulitis of left shoulder: Secondary | ICD-10-CM | POA: Diagnosis not present

## 2017-07-30 DIAGNOSIS — M7502 Adhesive capsulitis of left shoulder: Secondary | ICD-10-CM | POA: Diagnosis not present

## 2017-08-02 DIAGNOSIS — M25612 Stiffness of left shoulder, not elsewhere classified: Secondary | ICD-10-CM | POA: Diagnosis not present

## 2017-08-07 DIAGNOSIS — M7502 Adhesive capsulitis of left shoulder: Secondary | ICD-10-CM | POA: Diagnosis not present

## 2017-08-09 DIAGNOSIS — Z1231 Encounter for screening mammogram for malignant neoplasm of breast: Secondary | ICD-10-CM | POA: Diagnosis not present

## 2017-08-16 DIAGNOSIS — N6001 Solitary cyst of right breast: Secondary | ICD-10-CM | POA: Diagnosis not present

## 2017-08-27 ENCOUNTER — Encounter: Payer: Self-pay | Admitting: Obstetrics & Gynecology

## 2017-08-29 DIAGNOSIS — R3915 Urgency of urination: Secondary | ICD-10-CM | POA: Diagnosis not present

## 2017-08-29 DIAGNOSIS — H6691 Otitis media, unspecified, right ear: Secondary | ICD-10-CM | POA: Diagnosis not present

## 2017-08-31 DIAGNOSIS — H9201 Otalgia, right ear: Secondary | ICD-10-CM | POA: Diagnosis not present

## 2017-09-18 ENCOUNTER — Ambulatory Visit: Payer: 59 | Admitting: Nurse Practitioner

## 2017-09-19 ENCOUNTER — Ambulatory Visit: Payer: 59 | Admitting: Certified Nurse Midwife

## 2017-11-07 ENCOUNTER — Other Ambulatory Visit: Payer: Self-pay

## 2017-11-07 ENCOUNTER — Ambulatory Visit: Payer: 59 | Admitting: Certified Nurse Midwife

## 2017-11-07 ENCOUNTER — Encounter: Payer: Self-pay | Admitting: Certified Nurse Midwife

## 2017-11-07 ENCOUNTER — Other Ambulatory Visit (HOSPITAL_COMMUNITY)
Admission: RE | Admit: 2017-11-07 | Discharge: 2017-11-07 | Disposition: A | Discharge status: Home / Self Care | Payer: 59 | Source: Ambulatory Visit | Attending: Certified Nurse Midwife | Admitting: Certified Nurse Midwife

## 2017-11-07 VITALS — BP 138/80 | HR 70 | Resp 16 | Ht 64.5 in | Wt 178.0 lb

## 2017-11-07 DIAGNOSIS — E559 Vitamin D deficiency, unspecified: Secondary | ICD-10-CM

## 2017-11-07 DIAGNOSIS — N393 Stress incontinence (female) (male): Secondary | ICD-10-CM | POA: Insufficient documentation

## 2017-11-07 DIAGNOSIS — Z01419 Encounter for gynecological examination (general) (routine) without abnormal findings: Secondary | ICD-10-CM | POA: Diagnosis not present

## 2017-11-07 DIAGNOSIS — Z1151 Encounter for screening for human papillomavirus (HPV): Secondary | ICD-10-CM | POA: Insufficient documentation

## 2017-11-07 DIAGNOSIS — N951 Menopausal and female climacteric states: Secondary | ICD-10-CM

## 2017-11-07 DIAGNOSIS — Z124 Encounter for screening for malignant neoplasm of cervix: Secondary | ICD-10-CM | POA: Insufficient documentation

## 2017-11-07 NOTE — Progress Notes (Signed)
62 y.o. G67P2002 Married  Caucasian Fe here for annual exam. Menopausal no vaginal bleeding or vaginal dryness. Sees Dr. Laurann Montana yearly and does labs also and manages GERD. Some weight gain over last year, but eating healthy. Occasional stress incontinence with holding bladder for long periods of time. Would like vitamin D checked today due to not being done at PCP. Working on calcium in diet. No other health concerns today.  Patient's last menstrual period was 08/01/2007 (approximate).          Sexually active: Yes.    The current method of family planning is post menopausal status.    Exercising: Yes.    exercise program, walking 54miles, gym Smoker:  no  Health Maintenance: Pap:  08-03-15 neg HPV HR+, 16 18/45 neg,  09-12-16 neg HPV HR neg History of Abnormal Pap: yes MMG:  08-09-17 with f/u 08-16-17 pt to sign for records Self Breast exams: occ Colonoscopy:  2014 f/u 50yrs BMD:   2018 TDaP:  2018 Shingles: 2018 Pneumonia: no Hep C and HIV: both neg 2017 Labs: yes   reports that she has never smoked. She has never used smokeless tobacco. She reports that she drinks about 1.8 oz of alcohol per week. She reports that she does not use drugs.  Past Medical History:  Diagnosis Date  . Colon polyps 2003  . Detached retina   . Kidney stone 2016  . Left bundle branch block 2012  . Menorrhagia   . MGUS (monoclonal gammopathy of unknown significance)   . Plantar fasciitis   . Precancerous lesion 1987   mole excised from vulva  . PVD (posterior vitreous detachment), right 09/07/2015    Past Surgical History:  Procedure Laterality Date  . ABLATION  11/10/02   Hysteroscopic Thermal  . cyst on scalp     sebaccous(multiple times as a teen)  . gastro surgery  2012  . LAPAROSCOPIC CHOLECYSTECTOMY  11/2001  . left leg muscle surgery  2012  . OTHER SURGICAL HISTORY     Sebaceous cyst on head removed 7x's    Current Outpatient Medications  Medication Sig Dispense Refill  . Calcium Carbonate  (CALTRATE 600 PO) Take by mouth.    . cholecalciferol (VITAMIN D) 1000 UNITS tablet Take 1,000 Units by mouth daily.    . Glycerin-Polysorbate 80 (REFRESH DRY EYE THERAPY OP) Apply 1 drop to eye as needed.    . metroNIDAZOLE (METROGEL) 0.75 % gel Apply topically daily.    . Multiple Vitamins-Minerals (WOMENS MULTIVITAMIN PLUS PO) Take 1 tablet by mouth daily.    Marland Kitchen omeprazole (PRILOSEC) 20 MG capsule Take by mouth daily.  3  . tretinoin microspheres (RETIN-A MICRO) 0.04 % gel Apply 0.025 % topically every other day. Uses twice a week     No current facility-administered medications for this visit.     Family History  Problem Relation Age of Onset  . Colon cancer Father 55  . Alzheimer's disease Father   . Emphysema Father   . COPD Father   . Heart attack Mother   . Cancer Brother        peritod gland cancer  . Breast cancer Maternal Aunt        post menopause    ROS:  Pertinent items are noted in HPI.  Otherwise, a comprehensive ROS was negative.  Exam:   BP 138/80   Pulse 70   Resp 16   Ht 5' 4.5" (1.638 m)   Wt 178 lb (80.7 kg)   LMP  08/01/2007 (Approximate)   BMI 30.08 kg/m  Height: 5' 4.5" (163.8 cm) Ht Readings from Last 3 Encounters:  11/07/17 5' 4.5" (1.638 m)  01/16/17 5' 4.25" (1.632 m)  09/12/16 5' 4.25" (1.632 m)    General appearance: alert, cooperative and appears stated age Head: Normocephalic, without obvious abnormality, atraumatic Neck: no adenopathy, supple, symmetrical, trachea midline and thyroid normal to inspection and palpation Lungs: clear to auscultation bilaterally Breasts: normal appearance, no masses or tenderness, No nipple retraction or dimpling, No nipple discharge or bleeding, No axillary or supraclavicular adenopathy Heart: regular rate and rhythm Abdomen: soft, non-tender; no masses,  no organomegaly Extremities: extremities normal, atraumatic, no cyanosis or edema Skin: Skin color, texture, turgor normal. No rashes or lesions Lymph  nodes: Cervical, supraclavicular, and axillary nodes normal. No abnormal inguinal nodes palpated Neurologic: Grossly normal   Pelvic: External genitalia:  no lesions              Urethra:  normal appearing urethra with no masses, tenderness or lesions              Bartholin's and Skene's: normal                 Vagina: normal appearing vagina with normal color and discharge, no lesions              Cervix: no cervical motion tenderness and no lesions              Pap taken: Yes.   Bimanual Exam:  Uterus:  normal size, contour, position, consistency, mobility, non-tender and anteverted              Adnexa: normal adnexa and no mass, fullness, tenderness               Rectovaginal: Confirms               Anus:  normal sphincter tone, no lesions  Chaperone present: yes  A:  Well Woman with normal exam  Menopausal no HRT  History of Vitamin D deficiency  Stress incontinence  Follow up pap smear from + HPVHR with negative genotyping  P:   Reviewed health and wellness pertinent to exam  Discussed need to advise if vaginal bleeding or dryness issues.  Discussed not holding urine for long periods or time, which increase this occurrence  Lab Vitamin D  Pap smear: yes   counseled on breast self exam, mammography screening, feminine hygiene, menopause, adequate intake of calcium and vitamin D, diet and exercise  return annually or prn  An After Visit Summary was printed and given to the patient.

## 2017-11-07 NOTE — Patient Instructions (Signed)

## 2017-11-08 ENCOUNTER — Other Ambulatory Visit: Payer: Self-pay | Admitting: Certified Nurse Midwife

## 2017-11-08 DIAGNOSIS — E559 Vitamin D deficiency, unspecified: Secondary | ICD-10-CM

## 2017-11-08 LAB — VITAMIN D 25 HYDROXY (VIT D DEFICIENCY, FRACTURES): Vit D, 25-Hydroxy: 23.8 ng/mL — ABNORMAL LOW (ref 30.0–100.0)

## 2017-11-09 LAB — CYTOLOGY - PAP
Diagnosis: NEGATIVE
HPV (WINDOPATH): NOT DETECTED

## 2017-11-19 DIAGNOSIS — H2513 Age-related nuclear cataract, bilateral: Secondary | ICD-10-CM | POA: Diagnosis not present

## 2017-11-27 ENCOUNTER — Encounter: Payer: Self-pay | Admitting: Obstetrics & Gynecology

## 2017-12-17 DIAGNOSIS — H43811 Vitreous degeneration, right eye: Secondary | ICD-10-CM | POA: Diagnosis not present

## 2017-12-21 DIAGNOSIS — H4311 Vitreous hemorrhage, right eye: Secondary | ICD-10-CM | POA: Diagnosis not present

## 2018-01-08 ENCOUNTER — Ambulatory Visit
Admission: RE | Admit: 2018-01-08 | Discharge: 2018-01-08 | Disposition: A | Discharge status: Home / Self Care | Payer: 59 | Source: Ambulatory Visit | Attending: Internal Medicine | Admitting: Internal Medicine

## 2018-01-08 ENCOUNTER — Telehealth: Payer: Self-pay | Admitting: Hematology and Oncology

## 2018-01-08 ENCOUNTER — Other Ambulatory Visit: Payer: Self-pay | Admitting: Internal Medicine

## 2018-01-08 ENCOUNTER — Inpatient Hospital Stay: Payer: 59

## 2018-01-08 DIAGNOSIS — M79671 Pain in right foot: Secondary | ICD-10-CM | POA: Diagnosis not present

## 2018-01-08 DIAGNOSIS — M25571 Pain in right ankle and joints of right foot: Secondary | ICD-10-CM

## 2018-01-08 DIAGNOSIS — S93491A Sprain of other ligament of right ankle, initial encounter: Secondary | ICD-10-CM | POA: Diagnosis not present

## 2018-01-08 DIAGNOSIS — S99911A Unspecified injury of right ankle, initial encounter: Secondary | ICD-10-CM | POA: Diagnosis not present

## 2018-01-08 DIAGNOSIS — Z23 Encounter for immunization: Secondary | ICD-10-CM | POA: Diagnosis not present

## 2018-01-08 NOTE — Telephone Encounter (Signed)
Patient called to reschedule  °

## 2018-01-09 ENCOUNTER — Inpatient Hospital Stay: Payer: 59 | Attending: Hematology and Oncology

## 2018-01-09 DIAGNOSIS — E559 Vitamin D deficiency, unspecified: Secondary | ICD-10-CM | POA: Diagnosis not present

## 2018-01-09 DIAGNOSIS — D472 Monoclonal gammopathy: Secondary | ICD-10-CM | POA: Diagnosis not present

## 2018-01-09 DIAGNOSIS — K760 Fatty (change of) liver, not elsewhere classified: Secondary | ICD-10-CM | POA: Insufficient documentation

## 2018-01-09 LAB — CBC WITH DIFFERENTIAL/PLATELET
BASOS ABS: 0 10*3/uL (ref 0.0–0.1)
Basophils Relative: 0 %
EOS PCT: 2 %
Eosinophils Absolute: 0.1 10*3/uL (ref 0.0–0.5)
HCT: 42.3 % (ref 34.8–46.6)
HEMOGLOBIN: 14.3 g/dL (ref 11.6–15.9)
LYMPHS PCT: 18 %
Lymphs Abs: 1.4 10*3/uL (ref 0.9–3.3)
MCH: 30.4 pg (ref 25.1–34.0)
MCHC: 33.8 g/dL (ref 31.5–36.0)
MCV: 89.8 fL (ref 79.5–101.0)
Monocytes Absolute: 0.6 10*3/uL (ref 0.1–0.9)
Monocytes Relative: 8 %
NEUTROS ABS: 5.6 10*3/uL (ref 1.5–6.5)
NEUTROS PCT: 72 %
PLATELETS: 239 10*3/uL (ref 145–400)
RBC: 4.71 MIL/uL (ref 3.70–5.45)
RDW: 12.9 % (ref 11.2–14.5)
WBC: 7.8 10*3/uL (ref 3.9–10.3)

## 2018-01-09 LAB — COMPREHENSIVE METABOLIC PANEL
ALT: 96 U/L — ABNORMAL HIGH (ref 0–55)
ANION GAP: 9 (ref 3–11)
AST: 48 U/L — ABNORMAL HIGH (ref 5–34)
Albumin: 4.3 g/dL (ref 3.5–5.0)
Alkaline Phosphatase: 117 U/L (ref 40–150)
BILIRUBIN TOTAL: 0.6 mg/dL (ref 0.2–1.2)
BUN: 15 mg/dL (ref 7–26)
CO2: 23 mmol/L (ref 22–29)
Calcium: 10.5 mg/dL — ABNORMAL HIGH (ref 8.4–10.4)
Chloride: 107 mmol/L (ref 98–109)
Creatinine, Ser: 0.7 mg/dL (ref 0.60–1.10)
GFR calc non Af Amer: >60 mL/min (ref >60)
Glucose, Bld: 100 mg/dL (ref 70–140)
POTASSIUM: 3.9 mmol/L (ref 3.5–5.1)
Sodium: 139 mmol/L (ref 136–145)
TOTAL PROTEIN: 7.7 g/dL (ref 6.4–8.3)

## 2018-01-10 LAB — KAPPA/LAMBDA LIGHT CHAINS
Kappa free light chain: 17.9 mg/L (ref 3.3–19.4)
Kappa, lambda light chain ratio: 2.45 — ABNORMAL HIGH (ref 0.26–1.65)
Lambda free light chains: 7.3 mg/L (ref 5.7–26.3)

## 2018-01-11 DIAGNOSIS — S93401D Sprain of unspecified ligament of right ankle, subsequent encounter: Secondary | ICD-10-CM | POA: Diagnosis not present

## 2018-01-11 LAB — MULTIPLE MYELOMA PANEL, SERUM
ALBUMIN SERPL ELPH-MCNC: 3.9 g/dL (ref 2.9–4.4)
ALBUMIN/GLOB SERPL: 1.4 (ref 0.7–1.7)
Alpha 1: 0.3 g/dL (ref 0.0–0.4)
Alpha2 Glob SerPl Elph-Mcnc: 0.7 g/dL (ref 0.4–1.0)
B-Globulin SerPl Elph-Mcnc: 1 g/dL (ref 0.7–1.3)
GAMMA GLOB SERPL ELPH-MCNC: 1 g/dL (ref 0.4–1.8)
GLOBULIN, TOTAL: 2.9 g/dL (ref 2.2–3.9)
IGA: 48 mg/dL — AB (ref 87–352)
IgG (Immunoglobin G), Serum: 1223 mg/dL (ref 700–1600)
IgM (Immunoglobulin M), Srm: 33 mg/dL (ref 26–217)
M PROTEIN SERPL ELPH-MCNC: 0.6 g/dL — AB
Total Protein ELP: 6.8 g/dL (ref 6.0–8.5)

## 2018-01-14 DIAGNOSIS — M25571 Pain in right ankle and joints of right foot: Secondary | ICD-10-CM | POA: Diagnosis not present

## 2018-01-14 DIAGNOSIS — S93401A Sprain of unspecified ligament of right ankle, initial encounter: Secondary | ICD-10-CM | POA: Insufficient documentation

## 2018-01-15 ENCOUNTER — Encounter: Payer: Self-pay | Admitting: Hematology and Oncology

## 2018-01-15 ENCOUNTER — Inpatient Hospital Stay (HOSPITAL_BASED_OUTPATIENT_CLINIC_OR_DEPARTMENT_OTHER): Payer: 59 | Admitting: Hematology and Oncology

## 2018-01-15 ENCOUNTER — Telehealth: Payer: Self-pay | Admitting: Hematology and Oncology

## 2018-01-15 DIAGNOSIS — K760 Fatty (change of) liver, not elsewhere classified: Secondary | ICD-10-CM | POA: Diagnosis not present

## 2018-01-15 DIAGNOSIS — E559 Vitamin D deficiency, unspecified: Secondary | ICD-10-CM

## 2018-01-15 DIAGNOSIS — D472 Monoclonal gammopathy: Secondary | ICD-10-CM | POA: Diagnosis not present

## 2018-01-15 NOTE — Assessment & Plan Note (Signed)
She has intermittent elevated liver enzymes likely due to fatty liver disease I encouraged her to continue exercise program and weight loss as tolerated

## 2018-01-15 NOTE — Assessment & Plan Note (Signed)
She is on vitamin D replacement therapy

## 2018-01-15 NOTE — Assessment & Plan Note (Signed)
She has very mild hypercalcemia but I do not believe this is due to multiple myeloma I suspect she might be dehydrated at the time of blood draw I recommend increase hydration as tolerated 

## 2018-01-15 NOTE — Telephone Encounter (Signed)
Gave avs and calendar ° °

## 2018-01-15 NOTE — Assessment & Plan Note (Signed)
Her blood work is stable with no signs of progression, renal failure, hypercalcemia or anemia The mildly elevated calcium is likely unrelated Clinically, she has no signs of disease progression. I will see her on a yearly basis with history, physical examination, blood work to be done 1 week ahead of time prior to her next year return visit. I emphasized the importance of annual influenza vaccination. 

## 2018-01-15 NOTE — Progress Notes (Signed)
[facility] OFFICE PROGRESS NOTE  Patient Care Team: Lavone Orn, MD as PCP - General (Internal Medicine) [name], MD as Consulting Physician (Gastroenterology)  ASSESSMENT & PLAN:  MGUS (monoclonal gammopathy of unknown significance) Her blood work is stable with no signs of progression, renal failure, hypercalcemia or anemia The mildly elevated calcium is likely unrelated Clinically, Erika Bolton has no signs of disease progression. I will see her on a yearly basis with history, physical examination, blood work to be done 1 week ahead of time prior to her next year return visit. I emphasized the importance of annual influenza vaccination.  Fatty infiltration of liver Erika Bolton has intermittent elevated liver enzymes likely due to fatty liver disease I encouraged her to continue exercise program and weight loss as tolerated  Hypercalcemia Erika Bolton has very mild hypercalcemia but I do not believe this is due to multiple myeloma I suspect Erika Bolton might be dehydrated at the time of blood draw I recommend increase hydration as tolerated  Vitamin D deficiency Erika Bolton is on vitamin D replacement therapy   No orders of the defined types were placed in this encounter.   INTERVAL HISTORY: Please see below for problem oriented charting. Erika Bolton returns for further follow-up Erika Bolton thought Erika Bolton might have shingles in February 2019 although Erika Bolton never broke up with rash Erika Bolton denies recent history of recurrent infection Erika Bolton denies new bone pain Appetite is stable and Erika Bolton has gained some weight since her last time Erika Bolton was seen here.  SUMMARY OF ONCOLOGIC HISTORY:  Erika Bolton was transferred to my care after her prior physician has left.  I reviewed the patient's records extensive and collaborated the history with the patient. Summary of her history is as follows: This patient was discovered to have MGUS after Erika Bolton was found to have abnormal blood tests. Blood work review IgG kappa Erika Bolton was  observed.  REVIEW OF SYSTEMS:   Constitutional: Denies fevers, chills or abnormal weight loss Eyes: Denies blurriness of vision Ears, nose, mouth, throat, and face: Denies mucositis or sore throat Respiratory: Denies cough, dyspnea or wheezes Cardiovascular: Denies palpitation, chest discomfort or lower extremity swelling Gastrointestinal:  Denies nausea, heartburn or change in bowel habits Skin: Denies abnormal skin rashes Lymphatics: Denies new lymphadenopathy or easy bruising Neurological:Denies numbness, tingling or new weaknesses Behavioral/Psych: Mood is stable, no new changes  All other systems were reviewed with the patient and are negative.  I have reviewed the past medical history, past surgical history, social history and family history with the patient and they are unchanged from previous note.  ALLERGIES:  is allergic to other; penicillins; gold sodium thiosulfate; and nickel.  MEDICATIONS:  Current Outpatient Medications  Medication Sig Dispense Refill  . Calcium Carbonate (CALTRATE 600 PO) Take by mouth.    . cholecalciferol (VITAMIN D) 1000 UNITS tablet Take 1,000 Units by mouth daily.    . Glycerin-Polysorbate 80 (REFRESH DRY EYE THERAPY OP) Apply 1 drop to eye as needed.    . metroNIDAZOLE (METROGEL) 0.75 % gel Apply topically daily.    . Multiple Vitamins-Minerals (WOMENS MULTIVITAMIN PLUS PO) Take 1 tablet by mouth daily.    Marland Kitchen omeprazole (PRILOSEC) 20 MG capsule Take by mouth daily.  3  . tretinoin microspheres (RETIN-A MICRO) 0.04 % gel Apply 0.025 % topically every other day. Uses twice a week     No current facility-administered medications for this visit.     PHYSICAL EXAMINATION: ECOG PERFORMANCE STATUS: 0 - Asymptomatic  Vitals:   01/15/18 1522  BP: (!) 151/82  Pulse: 96  Resp: 18  Temp: 98.4 F (36.9 C)  SpO2: 98%   Filed Weights   01/15/18 1522  Weight: 178 lb (80.7 kg)    GENERAL:alert, no distress and comfortable SKIN: skin color,  texture, turgor are normal, no rashes or significant lesions EYES: normal, Conjunctiva are pink and non-injected, sclera clear OROPHARYNX:no exudate, no erythema and lips, buccal mucosa, and tongue normal  NECK: supple, thyroid normal size, non-tender, without nodularity LYMPH:  no palpable lymphadenopathy in the cervical, axillary or inguinal LUNGS: clear to auscultation and percussion with normal breathing effort HEART: regular rate & rhythm and no murmurs and no lower extremity edema ABDOMEN:abdomen soft, non-tender and normal bowel sounds Musculoskeletal:no cyanosis of digits and no clubbing  NEURO: alert & oriented x 3 with fluent speech, no focal motor/sensory deficits  LABORATORY DATA:  I have reviewed the data as listed    Component Value Date/Time   NA 139 01/09/2018 1604   NA 140 01/09/2017 1523   K 3.9 01/09/2018 1604   K 4.1 01/09/2017 1523   CL 107 01/09/2018 1604   CL 109 (H) 01/10/2013 1446   CO2 23 01/09/2018 1604   CO2 23 01/09/2017 1523   GLUCOSE 100 01/09/2018 1604   GLUCOSE 94 01/09/2017 1523   GLUCOSE 116 (H) 01/10/2013 1446   BUN 15 01/09/2018 1604   BUN 14.1 01/09/2017 1523   CREATININE 0.70 01/09/2018 1604   CREATININE 0.7 01/09/2017 1523   CALCIUM 10.5 (H) 01/09/2018 1604   CALCIUM 10.5 (H) 01/09/2017 1523   PROT 7.7 01/09/2018 1604   PROT 7.1 01/09/2017 1523   PROT 7.5 01/09/2017 1523   ALBUMIN 4.3 01/09/2018 1604   ALBUMIN 4.0 01/09/2017 1523   AST 48 (H) 01/09/2018 1604   AST 27 01/09/2017 1523   ALT 96 (H) 01/09/2018 1604   ALT 31 01/09/2017 1523   ALKPHOS 117 01/09/2018 1604   ALKPHOS 94 01/09/2017 1523   BILITOT 0.6 01/09/2018 1604   BILITOT 0.45 01/09/2017 1523   GFRNONAA >60 01/09/2018 1604   GFRAA >60 01/09/2018 1604    No results found for: SPEP, UPEP  Lab Results  Component Value Date   WBC 7.8 01/09/2018   NEUTROABS 5.6 01/09/2018   HGB 14.3 01/09/2018   HCT 42.3 01/09/2018   MCV 89.8 01/09/2018   PLT 239 01/09/2018       Chemistry      Component Value Date/Time   NA 139 01/09/2018 1604   NA 140 01/09/2017 1523   K 3.9 01/09/2018 1604   K 4.1 01/09/2017 1523   CL 107 01/09/2018 1604   CL 109 (H) 01/10/2013 1446   CO2 23 01/09/2018 1604   CO2 23 01/09/2017 1523   BUN 15 01/09/2018 1604   BUN 14.1 01/09/2017 1523   CREATININE 0.70 01/09/2018 1604   CREATININE 0.7 01/09/2017 1523      Component Value Date/Time   CALCIUM 10.5 (H) 01/09/2018 1604   CALCIUM 10.5 (H) 01/09/2017 1523   ALKPHOS 117 01/09/2018 1604   ALKPHOS 94 01/09/2017 1523   AST 48 (H) 01/09/2018 1604   AST 27 01/09/2017 1523   ALT 96 (H) 01/09/2018 1604   ALT 31 01/09/2017 1523   BILITOT 0.6 01/09/2018 1604   BILITOT 0.45 01/09/2017 1523       RADIOGRAPHIC STUDIES: I have personally reviewed the radiological images as listed and agreed with the findings in the report. Dg Ankle 2 Views Right  Result Date: 01/08/2018 CLINICAL  DATA:  Right ankle pain.  Injury EXAM: RIGHT ANKLE - 2 VIEW COMPARISON:  None. FINDINGS: There is no evidence of fracture, dislocation, or joint effusion. There is no evidence of arthropathy or other focal bone abnormality. Soft tissues are unremarkable. IMPRESSION: Negative. Electronically Signed   By: Franchot Gallo M.D.   On: 01/08/2018 12:28   Dg Foot 2 Views Right  Result Date: 01/08/2018 CLINICAL DATA:  Acute right ankle pain EXAM: RIGHT FOOT - 2 VIEW COMPARISON:  None. FINDINGS: There is no evidence of fracture or dislocation. There is no evidence of arthropathy or other focal bone abnormality. Soft tissues are unremarkable. IMPRESSION: Negative. Electronically Signed   By: Franchot Gallo M.D.   On: 01/08/2018 12:28    All questions were answered. The patient knows to call the clinic with any problems, questions or concerns. No barriers to learning was detected.  I spent 15 minutes counseling the patient face to face. The total time spent in the appointment was 20 minutes and more than 50% was on  counseling and review of test results  Heath Lark, MD 01/15/2018 3:33 PM

## 2018-01-17 DIAGNOSIS — M25571 Pain in right ankle and joints of right foot: Secondary | ICD-10-CM | POA: Diagnosis not present

## 2018-01-18 DIAGNOSIS — H9201 Otalgia, right ear: Secondary | ICD-10-CM | POA: Diagnosis not present

## 2018-01-21 DIAGNOSIS — G500 Trigeminal neuralgia: Secondary | ICD-10-CM | POA: Diagnosis not present

## 2018-01-22 DIAGNOSIS — H43811 Vitreous degeneration, right eye: Secondary | ICD-10-CM | POA: Diagnosis not present

## 2018-01-24 DIAGNOSIS — M25571 Pain in right ankle and joints of right foot: Secondary | ICD-10-CM | POA: Diagnosis not present

## 2018-01-28 DIAGNOSIS — M25571 Pain in right ankle and joints of right foot: Secondary | ICD-10-CM | POA: Diagnosis not present

## 2018-01-30 DIAGNOSIS — S93401D Sprain of unspecified ligament of right ankle, subsequent encounter: Secondary | ICD-10-CM | POA: Diagnosis not present

## 2018-02-04 DIAGNOSIS — M25571 Pain in right ankle and joints of right foot: Secondary | ICD-10-CM | POA: Diagnosis not present

## 2018-02-04 DIAGNOSIS — D4101 Neoplasm of uncertain behavior of right kidney: Secondary | ICD-10-CM | POA: Diagnosis not present

## 2018-02-08 DIAGNOSIS — N200 Calculus of kidney: Secondary | ICD-10-CM | POA: Diagnosis not present

## 2018-02-08 DIAGNOSIS — D4101 Neoplasm of uncertain behavior of right kidney: Secondary | ICD-10-CM | POA: Diagnosis not present

## 2018-02-08 DIAGNOSIS — M25571 Pain in right ankle and joints of right foot: Secondary | ICD-10-CM | POA: Diagnosis not present

## 2018-02-12 ENCOUNTER — Other Ambulatory Visit: Payer: 59

## 2018-02-12 DIAGNOSIS — E559 Vitamin D deficiency, unspecified: Secondary | ICD-10-CM

## 2018-02-13 ENCOUNTER — Telehealth: Payer: Self-pay | Admitting: Certified Nurse Midwife

## 2018-02-13 LAB — VITAMIN D 25 HYDROXY (VIT D DEFICIENCY, FRACTURES): VIT D 25 HYDROXY: 31.2 ng/mL (ref 30.0–100.0)

## 2018-02-13 NOTE — Telephone Encounter (Signed)
Spoke with patient. Vitamin D level 31.2. Has 2 months of Vit D 2,000 IU capsules left, ok to continue to complete what she has then start 1,000 IU daily?   Advised will review with Melvia Heaps, CNM and return call.   Melvia Heaps, CNM -please advise.

## 2018-02-13 NOTE — Telephone Encounter (Signed)
Patient has a question about her vitamin d levels.

## 2018-02-13 NOTE — Telephone Encounter (Signed)
Yes complete 2000 iu and maintain on 1000 IU D3 daily

## 2018-02-14 DIAGNOSIS — K219 Gastro-esophageal reflux disease without esophagitis: Secondary | ICD-10-CM | POA: Insufficient documentation

## 2018-02-14 DIAGNOSIS — R7401 Elevation of levels of liver transaminase levels: Secondary | ICD-10-CM | POA: Insufficient documentation

## 2018-02-14 DIAGNOSIS — Z86010 Personal history of colonic polyps: Secondary | ICD-10-CM | POA: Insufficient documentation

## 2018-02-14 DIAGNOSIS — N200 Calculus of kidney: Secondary | ICD-10-CM | POA: Diagnosis not present

## 2018-02-14 DIAGNOSIS — Z800 Family history of malignant neoplasm of digestive organs: Secondary | ICD-10-CM | POA: Insufficient documentation

## 2018-02-14 DIAGNOSIS — D4101 Neoplasm of uncertain behavior of right kidney: Secondary | ICD-10-CM | POA: Diagnosis not present

## 2018-02-14 DIAGNOSIS — N281 Cyst of kidney, acquired: Secondary | ICD-10-CM | POA: Diagnosis not present

## 2018-02-14 DIAGNOSIS — S93401D Sprain of unspecified ligament of right ankle, subsequent encounter: Secondary | ICD-10-CM | POA: Diagnosis not present

## 2018-02-14 NOTE — Telephone Encounter (Signed)
Spoke with patient, advised as seen below per Deborah Leonard, CNM. Patient verbalizes understanding and is agreeable.   Encounter closed.  

## 2018-02-15 DIAGNOSIS — Z86010 Personal history of colonic polyps: Secondary | ICD-10-CM | POA: Diagnosis not present

## 2018-02-15 DIAGNOSIS — R740 Nonspecific elevation of levels of transaminase and lactic acid dehydrogenase [LDH]: Secondary | ICD-10-CM | POA: Diagnosis not present

## 2018-02-15 DIAGNOSIS — D472 Monoclonal gammopathy: Secondary | ICD-10-CM | POA: Diagnosis not present

## 2018-03-06 DIAGNOSIS — N3001 Acute cystitis with hematuria: Secondary | ICD-10-CM | POA: Diagnosis not present

## 2018-05-07 DIAGNOSIS — Z23 Encounter for immunization: Secondary | ICD-10-CM | POA: Diagnosis not present

## 2018-05-28 DIAGNOSIS — D123 Benign neoplasm of transverse colon: Secondary | ICD-10-CM | POA: Diagnosis not present

## 2018-05-28 DIAGNOSIS — Z800 Family history of malignant neoplasm of digestive organs: Secondary | ICD-10-CM | POA: Diagnosis not present

## 2018-05-28 DIAGNOSIS — Z1211 Encounter for screening for malignant neoplasm of colon: Secondary | ICD-10-CM | POA: Diagnosis not present

## 2018-05-28 DIAGNOSIS — Z86010 Personal history of colonic polyps: Secondary | ICD-10-CM | POA: Diagnosis not present

## 2018-05-28 DIAGNOSIS — K635 Polyp of colon: Secondary | ICD-10-CM | POA: Diagnosis not present

## 2018-07-17 DIAGNOSIS — Z13220 Encounter for screening for lipoid disorders: Secondary | ICD-10-CM | POA: Diagnosis not present

## 2018-07-17 DIAGNOSIS — I10 Essential (primary) hypertension: Secondary | ICD-10-CM | POA: Diagnosis not present

## 2018-07-17 DIAGNOSIS — G4733 Obstructive sleep apnea (adult) (pediatric): Secondary | ICD-10-CM | POA: Diagnosis not present

## 2018-07-17 DIAGNOSIS — L719 Rosacea, unspecified: Secondary | ICD-10-CM | POA: Diagnosis not present

## 2018-07-17 DIAGNOSIS — D225 Melanocytic nevi of trunk: Secondary | ICD-10-CM | POA: Diagnosis not present

## 2018-07-17 DIAGNOSIS — D2262 Melanocytic nevi of left upper limb, including shoulder: Secondary | ICD-10-CM | POA: Diagnosis not present

## 2018-07-17 DIAGNOSIS — K219 Gastro-esophageal reflux disease without esophagitis: Secondary | ICD-10-CM | POA: Diagnosis not present

## 2018-07-17 DIAGNOSIS — Z0000 Encounter for general adult medical examination without abnormal findings: Secondary | ICD-10-CM | POA: Diagnosis not present

## 2018-08-12 ENCOUNTER — Encounter: Payer: Self-pay | Admitting: Obstetrics & Gynecology

## 2018-11-12 ENCOUNTER — Ambulatory Visit: Payer: 59 | Admitting: Certified Nurse Midwife

## 2019-01-09 ENCOUNTER — Inpatient Hospital Stay: Payer: 59 | Attending: Hematology and Oncology

## 2019-01-09 ENCOUNTER — Other Ambulatory Visit: Payer: Self-pay | Admitting: Hematology and Oncology

## 2019-01-09 ENCOUNTER — Other Ambulatory Visit: Payer: Self-pay

## 2019-01-09 DIAGNOSIS — D472 Monoclonal gammopathy: Secondary | ICD-10-CM | POA: Insufficient documentation

## 2019-01-09 DIAGNOSIS — Z880 Allergy status to penicillin: Secondary | ICD-10-CM | POA: Diagnosis not present

## 2019-01-09 DIAGNOSIS — E559 Vitamin D deficiency, unspecified: Secondary | ICD-10-CM | POA: Diagnosis not present

## 2019-01-09 DIAGNOSIS — Z79899 Other long term (current) drug therapy: Secondary | ICD-10-CM | POA: Diagnosis not present

## 2019-01-09 DIAGNOSIS — R7989 Other specified abnormal findings of blood chemistry: Secondary | ICD-10-CM | POA: Diagnosis not present

## 2019-01-09 LAB — CBC WITH DIFFERENTIAL/PLATELET
Abs Immature Granulocytes: 0.01 10*3/uL (ref 0.00–0.07)
Basophils Absolute: 0.1 10*3/uL (ref 0.0–0.1)
Basophils Relative: 1 %
Eosinophils Absolute: 0.2 10*3/uL (ref 0.0–0.5)
Eosinophils Relative: 3 %
HCT: 43.8 % (ref 36.0–46.0)
Hemoglobin: 14.7 g/dL (ref 12.0–15.0)
Immature Granulocytes: 0 %
Lymphocytes Relative: 24 %
Lymphs Abs: 1.8 10*3/uL (ref 0.7–4.0)
MCH: 30.3 pg (ref 26.0–34.0)
MCHC: 33.6 g/dL (ref 30.0–36.0)
MCV: 90.3 fL (ref 80.0–100.0)
Monocytes Absolute: 0.6 10*3/uL (ref 0.1–1.0)
Monocytes Relative: 9 %
Neutro Abs: 4.6 10*3/uL (ref 1.7–7.7)
Neutrophils Relative %: 63 %
Platelets: 261 10*3/uL (ref 150–400)
RBC: 4.85 MIL/uL (ref 3.87–5.11)
RDW: 12.9 % (ref 11.5–15.5)
WBC: 7.2 10*3/uL (ref 4.0–10.5)
nRBC: 0 % (ref 0.0–0.2)

## 2019-01-09 LAB — COMPREHENSIVE METABOLIC PANEL
ALT: 43 U/L (ref 0–44)
AST: 35 U/L (ref 15–41)
Albumin: 4.1 g/dL (ref 3.5–5.0)
Alkaline Phosphatase: 108 U/L (ref 38–126)
Anion gap: 9 (ref 5–15)
BUN: 15 mg/dL (ref 8–23)
CO2: 23 mmol/L (ref 22–32)
Calcium: 10.8 mg/dL — ABNORMAL HIGH (ref 8.9–10.3)
Chloride: 108 mmol/L (ref 98–111)
Creatinine, Ser: 0.76 mg/dL (ref 0.44–1.00)
GFR calc Af Amer: >60 mL/min (ref >60)
GFR calc non Af Amer: >60 mL/min (ref >60)
Glucose, Bld: 104 mg/dL — ABNORMAL HIGH (ref 70–99)
Potassium: 4.3 mmol/L (ref 3.5–5.1)
Sodium: 140 mmol/L (ref 135–145)
Total Bilirubin: 0.4 mg/dL (ref 0.3–1.2)
Total Protein: 8 g/dL (ref 6.5–8.1)

## 2019-01-10 LAB — KAPPA/LAMBDA LIGHT CHAINS
Kappa free light chain: 18.8 mg/L (ref 3.3–19.4)
Kappa, lambda light chain ratio: 2.69 — ABNORMAL HIGH (ref 0.26–1.65)
Lambda free light chains: 7 mg/L (ref 5.7–26.3)

## 2019-01-10 LAB — VITAMIN D 25 HYDROXY (VIT D DEFICIENCY, FRACTURES): Vit D, 25-Hydroxy: 29 ng/mL — ABNORMAL LOW (ref 30.0–100.0)

## 2019-01-13 ENCOUNTER — Other Ambulatory Visit: Payer: Self-pay

## 2019-01-13 LAB — MULTIPLE MYELOMA PANEL, SERUM
Albumin SerPl Elph-Mcnc: 3.8 g/dL (ref 2.9–4.4)
Albumin/Glob SerPl: 1.2 (ref 0.7–1.7)
Alpha 1: 0.3 g/dL (ref 0.0–0.4)
Alpha2 Glob SerPl Elph-Mcnc: 0.8 g/dL (ref 0.4–1.0)
B-Globulin SerPl Elph-Mcnc: 1 g/dL (ref 0.7–1.3)
Gamma Glob SerPl Elph-Mcnc: 1.2 g/dL (ref 0.4–1.8)
Globulin, Total: 3.2 g/dL (ref 2.2–3.9)
IgA: 51 mg/dL — ABNORMAL LOW (ref 87–352)
IgG (Immunoglobin G), Serum: 1313 mg/dL (ref 586–1602)
IgM (Immunoglobulin M), Srm: 34 mg/dL (ref 26–217)
M Protein SerPl Elph-Mcnc: 0.6 g/dL — ABNORMAL HIGH
Total Protein ELP: 7 g/dL (ref 6.0–8.5)

## 2019-01-15 ENCOUNTER — Other Ambulatory Visit (HOSPITAL_COMMUNITY)
Admission: RE | Admit: 2019-01-15 | Discharge: 2019-01-15 | Disposition: A | Discharge status: Home / Self Care | Payer: 59 | Source: Ambulatory Visit | Attending: Certified Nurse Midwife | Admitting: Certified Nurse Midwife

## 2019-01-15 ENCOUNTER — Ambulatory Visit: Payer: 59 | Admitting: Certified Nurse Midwife

## 2019-01-15 ENCOUNTER — Encounter: Payer: Self-pay | Admitting: Certified Nurse Midwife

## 2019-01-15 ENCOUNTER — Other Ambulatory Visit: Payer: Self-pay

## 2019-01-15 VITALS — BP 120/78 | HR 64 | Temp 97.6°F | Resp 16 | Ht 64.25 in | Wt 166.0 lb

## 2019-01-15 DIAGNOSIS — Z01419 Encounter for gynecological examination (general) (routine) without abnormal findings: Secondary | ICD-10-CM | POA: Diagnosis not present

## 2019-01-15 DIAGNOSIS — Z124 Encounter for screening for malignant neoplasm of cervix: Secondary | ICD-10-CM

## 2019-01-15 DIAGNOSIS — E559 Vitamin D deficiency, unspecified: Secondary | ICD-10-CM

## 2019-01-15 DIAGNOSIS — Z8742 Personal history of other diseases of the female genital tract: Secondary | ICD-10-CM

## 2019-01-15 DIAGNOSIS — Z780 Asymptomatic menopausal state: Secondary | ICD-10-CM

## 2019-01-15 NOTE — Progress Notes (Signed)
63 y.o. G99P2002 Married  Caucasian Fe here for annual exam. Post menopausal no vaginal bleeding or vaginal dryness. Sees PCP Dr. Laurann Montana for aex, labs and elevated liver enzymes. Repeat screen was normal again. Vitamin D also done was 29. Working on with supplement. Walking for exercise, no other health issues today. Working from home and enjoying it.  Patient's last menstrual period was 08/01/2007 (approximate).          Sexually active: No.  The current method of family planning is post menopausal status.    Exercising: Yes.    walking Smoker:  no  Review of Systems  Constitutional: Negative.   HENT: Negative.   Eyes: Negative.   Respiratory: Negative.   Cardiovascular: Negative.   Gastrointestinal: Negative.   Genitourinary: Negative.   Musculoskeletal: Negative.   Skin: Negative.   Neurological: Negative.   Endo/Heme/Allergies: Negative.   Psychiatric/Behavioral: Negative.     Health Maintenance: Pap: 11-07-17 neg HPV HR neg,  09-12-16 neg HPV HR neg History of Abnormal Pap: yes MMG:  08-12-2018 category b density birads 1:neg Self Breast exams: yes Colonoscopy: 05-28-2018 f/u 39yrs BMD:   2018 TDaP:  2018 Shingles: 2018 Pneumonia: not done Hep C and HIV: both neg 2017 Labs: no PCP   reports that she has never smoked. She has never used smokeless tobacco. She reports current alcohol use of about 3.0 standard drinks of alcohol per week. She reports that she does not use drugs.  Past Medical History:  Diagnosis Date  . Colon polyps 2003  . Detached retina   . Kidney stone 2016  . Left bundle branch block 2012  . Menorrhagia   . MGUS (monoclonal gammopathy of unknown significance)   . Plantar fasciitis   . Precancerous lesion 1987   mole excised from vulva  . PVD (posterior vitreous detachment), right 09/07/2015    Past Surgical History:  Procedure Laterality Date  . ABLATION  11/10/02   Hysteroscopic Thermal  . cyst on scalp     sebaccous(multiple times as a  teen)  . gastro surgery  2012  . LAPAROSCOPIC CHOLECYSTECTOMY  11/2001  . left leg muscle surgery  2012  . OTHER SURGICAL HISTORY     Sebaceous cyst on head removed 7x's    Current Outpatient Medications  Medication Sig Dispense Refill  . Calcium Carbonate (CALTRATE 600 PO) Take by mouth.    . cholecalciferol (VITAMIN D) 1000 UNITS tablet Take 1,000 Units by mouth daily.    Marland Kitchen ELDERBERRY PO Take by mouth.    . loratadine (CLARITIN) 10 MG tablet Take 10 mg by mouth daily.    . metroNIDAZOLE (METROGEL) 0.75 % gel metronidazole 0.75 % topical gel  APPLY TO AFFECTED AREA EVERY DAY    . Milk Thistle 300 MG CAPS Take by mouth.    . Multiple Vitamins-Minerals (WOMENS MULTIVITAMIN PLUS PO) Take 1 tablet by mouth daily.    Marland Kitchen omeprazole (PRILOSEC) 20 MG capsule Take by mouth daily.  3  . tretinoin (RETIN-A) 0.05 % cream tretinoin 0.05 % topical cream  APPLY USING 1 APPLICATION TO AFFECTED AREA IN THE EVENING TO FACE ONCE A DAY EXTERNALLY(FOR ACNE)    . vitamin C (ASCORBIC ACID) 500 MG tablet Take 500 mg by mouth daily.     No current facility-administered medications for this visit.     Family History  Problem Relation Age of Onset  . Colon cancer Father 70  . Alzheimer's disease Father   . Emphysema Father   . COPD  Father   . Heart attack Mother   . Cancer Brother        peritod gland cancer  . Breast cancer Maternal Aunt        post menopause    ROS:  Pertinent items are noted in HPI.  Otherwise, a comprehensive ROS was negative.  Exam:   BP 120/78   Pulse 64   Temp 97.6 F (36.4 C) (Skin)   Resp 16   Ht 5' 4.25" (1.632 m)   Wt 166 lb (75.3 kg)   LMP 08/01/2007 (Approximate)   BMI 28.27 kg/m  Height: 5' 4.25" (163.2 cm) Ht Readings from Last 3 Encounters:  01/15/19 5' 4.25" (1.632 m)  01/15/18 5' 4.5" (1.638 m)  11/07/17 5' 4.5" (1.638 m)    General appearance: alert, cooperative and appears stated age Head: Normocephalic, without obvious abnormality,  atraumatic Neck: no adenopathy, supple, symmetrical, trachea midline and thyroid normal to inspection and palpation Lungs: clear to auscultation bilaterally Breasts: normal appearance, no masses or tenderness, No nipple retraction or dimpling, No nipple discharge or bleeding, No axillary or supraclavicular adenopathy Heart: regular rate and rhythm Abdomen: soft, non-tender; no masses,  no organomegaly Extremities: extremities normal, atraumatic, no cyanosis or edema Skin: Skin color, texture, turgor normal. No rashes or lesions Lymph nodes: Cervical, supraclavicular, and axillary nodes normal. No abnormal inguinal nodes palpated Neurologic: Grossly normal   Pelvic: External genitalia:  no lesions              Urethra:  normal appearing urethra with no masses, tenderness or lesions              Bartholin's and Skene's: normal                 Vagina: normal appearing vagina with normal color and discharge, no lesions              Cervix: no cervical motion tenderness, no lesions and normal appearance              Pap taken: Yes.   Bimanual Exam:  Uterus:  normal size, contour, position, consistency, mobility, non-tender and anteverted              Adnexa: normal adnexa and no mass, fullness, tenderness               Rectovaginal: Confirms               Anus:  normal sphincter tone, no lesions  Chaperone present: yes   A: Normal well woman exam  Post menopausal no HRT  Vitamin D Deficiency on supplement  PCP management of labs, aex  Elevated calcium per PCP, has appointment with Endocrine  P:   Reviewed health and wellness pertinent to exam  Aware of need to advise if vaginal bleeding  Discussed supplement use and food sources and support for bone health  Continue PCP follow up as indicated and keep endocrine appointment  Pap smear: yes   counseled on breast self exam, mammography screening, feminine hygiene, adequate intake of calcium and vitamin D, diet and exercise, Kegel's  exercises  return annually or prn  An After Visit Summary was printed and given to the patient.

## 2019-01-16 ENCOUNTER — Inpatient Hospital Stay (HOSPITAL_BASED_OUTPATIENT_CLINIC_OR_DEPARTMENT_OTHER): Payer: 59 | Admitting: Hematology and Oncology

## 2019-01-16 ENCOUNTER — Other Ambulatory Visit: Payer: Self-pay

## 2019-01-16 ENCOUNTER — Encounter: Payer: Self-pay | Admitting: Hematology and Oncology

## 2019-01-16 DIAGNOSIS — D472 Monoclonal gammopathy: Secondary | ICD-10-CM

## 2019-01-16 DIAGNOSIS — E559 Vitamin D deficiency, unspecified: Secondary | ICD-10-CM

## 2019-01-16 DIAGNOSIS — Z79899 Other long term (current) drug therapy: Secondary | ICD-10-CM

## 2019-01-16 DIAGNOSIS — Z880 Allergy status to penicillin: Secondary | ICD-10-CM

## 2019-01-16 DIAGNOSIS — R7989 Other specified abnormal findings of blood chemistry: Secondary | ICD-10-CM | POA: Diagnosis not present

## 2019-01-16 NOTE — Assessment & Plan Note (Signed)
She has very mild hypercalcemia but I do not believe this is due to multiple myeloma I recommend increase hydration as tolerated Her primary doctor has referred her to see endocrinologist I would defer to them for further management

## 2019-01-16 NOTE — Progress Notes (Signed)
[name] OFFICE PROGRESS NOTE  Patient Care Team: Lavone Orn, MD as PCP - General (Internal Medicine) [name], MD as Consulting Physician (Gastroenterology)  ASSESSMENT & PLAN:  Monoclonal gammopathy of unknown significance (MGUS) Her blood work is stable with no signs of progression, renal failure, hypercalcemia or anemia The mildly elevated calcium is likely unrelated Clinically, she has no signs of disease progression. I will see her on a yearly basis with history, physical examination, blood work to be done 1 week ahead of time prior to her next year return visit. I emphasized the importance of annual influenza vaccination.  Vitamin D deficiency She has borderline vitamin D deficiency She will continue vitamin D supplement  Hypercalcemia She has very mild hypercalcemia but I do not believe this is due to multiple myeloma I recommend increase hydration as tolerated Her primary doctor has referred her to see endocrinologist I would defer to them for further management   Orders Placed This Encounter  Procedures  . CBC with Differential/Platelet    Standing Status:   Future    Standing Expiration Date:   02/20/2020  . Comprehensive metabolic panel    Standing Status:   Future    Standing Expiration Date:   02/20/2020  . Kappa/lambda light chains    Standing Status:   Future    Standing Expiration Date:   02/20/2020  . Multiple Myeloma Panel (SPEP&IFE w/QIG)    Standing Status:   Future    Standing Expiration Date:   02/20/2020  . VITAMIN D 25 Hydroxy (Vit-D Deficiency, Fractures)    Standing Status:   Future    Standing Expiration Date:   01/16/2020    INTERVAL HISTORY: Please see below for problem oriented charting. She returns for further follow-up She denies new bone pain No recent infection, fever or chills She does not have symptoms of hypercalcemia such as excessive thirst, mental confusion or severe constipation  SUMMARY OF ONCOLOGIC  HISTORY: Erika Bolton was transferred to my care after her prior physician has left.  I reviewed the patient's records extensive and collaborated the history with the patient. Summary of her history is as follows: This patient was discovered to have MGUS after she was found to have abnormal blood tests. Blood work revealed IgG kappa She was observed.  REVIEW OF SYSTEMS:   Constitutional: Denies fevers, chills or abnormal weight loss Eyes: Denies blurriness of vision Ears, nose, mouth, throat, and face: Denies mucositis or sore throat Respiratory: Denies cough, dyspnea or wheezes Cardiovascular: Denies palpitation, chest discomfort or lower extremity swelling Gastrointestinal:  Denies nausea, heartburn or change in bowel habits Skin: Denies abnormal skin rashes Lymphatics: Denies new lymphadenopathy or easy bruising Neurological:Denies numbness, tingling or new weaknesses Behavioral/Psych: Mood is stable, no new changes  All other systems were reviewed with the patient and are negative.  I have reviewed the past medical history, past surgical history, social history and family history with the patient and they are unchanged from previous note.  ALLERGIES:  is allergic to other; penicillins; gold sodium thiosulfate; and nickel.  MEDICATIONS:  Current Outpatient Medications  Medication Sig Dispense Refill  . Calcium Carbonate (CALTRATE 600 PO) Take by mouth.    . cholecalciferol (VITAMIN D) 1000 UNITS tablet Take 1,000 Units by mouth daily.    Marland Kitchen ELDERBERRY PO Take by mouth.    . loratadine (CLARITIN) 10 MG tablet Take 10 mg by mouth daily.    . metroNIDAZOLE (METROGEL) 0.75 % gel metronidazole 0.75 % topical  gel  APPLY TO AFFECTED AREA EVERY DAY    . Milk Thistle 300 MG CAPS Take by mouth.    . Multiple Vitamins-Minerals (WOMENS MULTIVITAMIN PLUS PO) Take 1 tablet by mouth daily.    Marland Kitchen omeprazole (PRILOSEC) 20 MG capsule Take by mouth daily.  3  . tretinoin (RETIN-A) 0.05 % cream  tretinoin 0.05 % topical cream  APPLY USING 1 APPLICATION TO AFFECTED AREA IN THE EVENING TO FACE ONCE A DAY EXTERNALLY(FOR ACNE)    . vitamin C (ASCORBIC ACID) 500 MG tablet Take 500 mg by mouth daily.     No current facility-administered medications for this visit.     PHYSICAL EXAMINATION: ECOG PERFORMANCE STATUS: 0 - Asymptomatic  Vitals:   01/16/19 1049  BP: 139/71  Pulse: 89  Resp: 18  Temp: 99 F (37.2 C)  SpO2: 99%   Filed Weights   01/16/19 1049  Weight: 167 lb 3.2 oz (75.8 kg)    GENERAL:alert, no distress and comfortable Musculoskeletal:no cyanosis of digits and no clubbing  NEURO: alert & oriented x 3 with fluent speech, no focal motor/sensory deficits  LABORATORY DATA:  I have reviewed the data as listed    Component Value Date/Time   NA 140 01/09/2019 1615   NA 140 01/09/2017 1523   K 4.3 01/09/2019 1615   K 4.1 01/09/2017 1523   CL 108 01/09/2019 1615   CL 109 (H) 01/10/2013 1446   CO2 23 01/09/2019 1615   CO2 23 01/09/2017 1523   GLUCOSE 104 (H) 01/09/2019 1615   GLUCOSE 94 01/09/2017 1523   GLUCOSE 116 (H) 01/10/2013 1446   BUN 15 01/09/2019 1615   BUN 14.1 01/09/2017 1523   CREATININE 0.76 01/09/2019 1615   CREATININE 0.7 01/09/2017 1523   CALCIUM 10.8 (H) 01/09/2019 1615   CALCIUM 10.5 (H) 01/09/2017 1523   PROT 8.0 01/09/2019 1615   PROT 7.1 01/09/2017 1523   PROT 7.5 01/09/2017 1523   ALBUMIN 4.1 01/09/2019 1615   ALBUMIN 4.0 01/09/2017 1523   AST 35 01/09/2019 1615   AST 27 01/09/2017 1523   ALT 43 01/09/2019 1615   ALT 31 01/09/2017 1523   ALKPHOS 108 01/09/2019 1615   ALKPHOS 94 01/09/2017 1523   BILITOT 0.4 01/09/2019 1615   BILITOT 0.45 01/09/2017 1523   GFRNONAA >60 01/09/2019 1615   GFRAA >60 01/09/2019 1615    No results found for: SPEP, UPEP  Lab Results  Component Value Date   WBC 7.2 01/09/2019   NEUTROABS 4.6 01/09/2019   HGB 14.7 01/09/2019   HCT 43.8 01/09/2019   MCV 90.3 01/09/2019   PLT 261 01/09/2019       Chemistry      Component Value Date/Time   NA 140 01/09/2019 1615   NA 140 01/09/2017 1523   K 4.3 01/09/2019 1615   K 4.1 01/09/2017 1523   CL 108 01/09/2019 1615   CL 109 (H) 01/10/2013 1446   CO2 23 01/09/2019 1615   CO2 23 01/09/2017 1523   BUN 15 01/09/2019 1615   BUN 14.1 01/09/2017 1523   CREATININE 0.76 01/09/2019 1615   CREATININE 0.7 01/09/2017 1523      Component Value Date/Time   CALCIUM 10.8 (H) 01/09/2019 1615   CALCIUM 10.5 (H) 01/09/2017 1523   ALKPHOS 108 01/09/2019 1615   ALKPHOS 94 01/09/2017 1523   AST 35 01/09/2019 1615   AST 27 01/09/2017 1523   ALT 43 01/09/2019 1615   ALT 31 01/09/2017 1523   BILITOT  0.4 01/09/2019 1615   BILITOT 0.45 01/09/2017 1523       All questions were answered. The patient knows to call the clinic with any problems, questions or concerns. No barriers to learning was detected.  I spent 10 minutes counseling the patient face to face. The total time spent in the appointment was 15 minutes and more than 50% was on counseling and review of test results  Heath Lark, MD 01/16/2019 1:09 PM

## 2019-01-16 NOTE — Assessment & Plan Note (Signed)
Her blood work is stable with no signs of progression, renal failure, hypercalcemia or anemia The mildly elevated calcium is likely unrelated Clinically, she has no signs of disease progression. I will see her on a yearly basis with history, physical examination, blood work to be done 1 week ahead of time prior to her next year return visit. I emphasized the importance of annual influenza vaccination.

## 2019-01-16 NOTE — Assessment & Plan Note (Signed)
She has borderline vitamin D deficiency She will continue vitamin D supplement

## 2019-01-17 LAB — CYTOLOGY - PAP: Diagnosis: NEGATIVE

## 2019-08-08 ENCOUNTER — Ambulatory Visit: Payer: 59 | Attending: Internal Medicine

## 2019-08-08 ENCOUNTER — Other Ambulatory Visit: Payer: 59

## 2019-08-08 DIAGNOSIS — Z20822 Contact with and (suspected) exposure to covid-19: Secondary | ICD-10-CM

## 2019-08-09 LAB — NOVEL CORONAVIRUS, NAA: SARS-CoV-2, NAA: NOT DETECTED

## 2019-08-19 ENCOUNTER — Encounter: Payer: Self-pay | Admitting: Certified Nurse Midwife

## 2019-10-14 ENCOUNTER — Telehealth: Payer: Self-pay | Admitting: Certified Nurse Midwife

## 2019-10-14 ENCOUNTER — Ambulatory Visit: Payer: 59 | Admitting: Obstetrics and Gynecology

## 2019-10-14 ENCOUNTER — Encounter: Payer: Self-pay | Admitting: Obstetrics and Gynecology

## 2019-10-14 ENCOUNTER — Other Ambulatory Visit: Payer: Self-pay

## 2019-10-14 VITALS — BP 122/70 | HR 84 | Temp 97.2°F | Resp 14 | Ht 64.5 in | Wt 169.4 lb

## 2019-10-14 DIAGNOSIS — L0292 Furuncle, unspecified: Secondary | ICD-10-CM

## 2019-10-14 NOTE — Telephone Encounter (Signed)
Patient wants to speak with a nurse. Patient states she thinks she has a spider bite and not sure where she needs to go.

## 2019-10-14 NOTE — Telephone Encounter (Signed)
Spoke with patient. Patient reports nickel size hard lump with a hard center and a head. Red and tender. Denies any drainage. Noticed it over the weekend, has been keeping the area clean and dry, no improvement. Patient states she assumed it was a "spider bite", does not recall seeing any spiders or know where this would have occurred. Denies fever/chills. Patient is unsure where to be evaluated.   Covid 19 prescreen negative, precautions reviewed. Recommended OV with MD, patient agreeable. OV scheduled for today at 2:45pm with Dr. Talbert Nan.   Last AEX 01/15/19 with DL  Routing to provider for final review. Patient is agreeable to disposition. Will close encounter.

## 2019-10-14 NOTE — Patient Instructions (Signed)
Skin Abscess  A skin abscess is an infected area on or under your skin that contains a collection of pus and other material. An abscess may also be called a furuncle, carbuncle, or boil. An abscess can occur in or on almost any part of your body. Some abscesses break open (rupture) on their own. Most continue to get worse unless they are treated. The infection can spread deeper into the body and eventually into your blood, which can make you feel ill. Treatment usually involves draining the abscess. What are the causes? An abscess occurs when germs, like bacteria, pass through your skin and cause an infection. This may be caused by:  A scrape or cut on your skin.  A puncture wound through your skin, including a needle injection or insect bite.  Blocked oil or sweat glands.  Blocked and infected hair follicles.  A cyst that forms beneath your skin (sebaceous cyst) and becomes infected. What increases the risk? This condition is more likely to develop in people who:  Have a weak body defense system (immune system).  Have diabetes.  Have dry and irritated skin.  Get frequent injections or use illegal IV drugs.  Have a foreign body in a wound, such as a splinter.  Have problems with their lymph system or veins. What are the signs or symptoms? Symptoms of this condition include:  A painful, firm bump under the skin.  A bump with pus at the top. This may break through the skin and drain. Other symptoms include:  Redness surrounding the abscess site.  Warmth.  Swelling of the lymph nodes (glands) near the abscess.  Tenderness.  A sore on the skin. How is this diagnosed? This condition may be diagnosed based on:  A physical exam.  Your medical history.  A sample of pus. This may be used to find out what is causing the infection.  Blood tests.  Imaging tests, such as an ultrasound, CT scan, or MRI. How is this treated? A small abscess that drains on its own may  not need treatment. Treatment for larger abscesses may include:  Moist heat or heat pack applied to the area several times a day.  A procedure to drain the abscess (incision and drainage).  Antibiotic medicines. For a severe abscess, you may first get antibiotics through an IV and then change to antibiotics by mouth. Follow these instructions at home: Medicines   Take over-the-counter and prescription medicines only as told by your health care provider.  If you were prescribed an antibiotic medicine, take it as told by your health care provider. Do not stop taking the antibiotic even if you start to feel better. Abscess care   If you have an abscess that has not drained, apply heat to the affected area. Use the heat source that your health care provider recommends, such as a moist heat pack or a heating pad. ? Place a towel between your skin and the heat source. ? Leave the heat on for 20-30 minutes. ? Remove the heat if your skin turns bright red. This is especially important if you are unable to feel pain, heat, or cold. You may have a greater risk of getting burned.  Follow instructions from your health care provider about how to take care of your abscess. Make sure you: ? Cover the abscess with a bandage (dressing). ? Change your dressing or gauze as told by your health care provider. ? Wash your hands with soap and water before you change the   dressing or gauze. If soap and water are not available, use hand sanitizer.  Check your abscess every day for signs of a worsening infection. Check for: ? More redness, swelling, or pain. ? More fluid or blood. ? Warmth. ? More pus or a bad smell. General instructions  To avoid spreading the infection: ? Do not share personal care items, towels, or hot tubs with others. ? Avoid making skin contact with other people.  Keep all follow-up visits as told by your health care provider. This is important. Contact a health care provider if  you have:  More redness, swelling, or pain around your abscess.  More fluid or blood coming from your abscess.  Warm skin around your abscess.  More pus or a bad smell coming from your abscess.  A fever.  Muscle aches.  Chills or a general ill feeling. Get help right away if you:  Have severe pain.  See red streaks on your skin spreading away from the abscess. Summary  A skin abscess is an infected area on or under your skin that contains a collection of pus and other material.  A small abscess that drains on its own may not need treatment.  Treatment for larger abscesses may include having a procedure to drain the abscess and taking an antibiotic. This information is not intended to replace advice given to you by your health care provider. Make sure you discuss any questions you have with your health care provider. Document Revised: 11/07/2018 Document Reviewed: 08/30/2017 Elsevier Patient Education  2020 Elsevier Inc.  

## 2019-10-14 NOTE — Progress Notes (Signed)
GYNECOLOGY  VISIT   HPI: 64 y.o.   Married White or Caucasian Not Hispanic or Latino  female   [phone] with Patient's last menstrual period was 08/01/2007 (approximate).   here for   Pain lump in the peri-rectal area that patient noticed on Saturday afternoon. No bleeding   GYNECOLOGIC HISTORY: Patient's last menstrual period was 08/01/2007 (approximate). Contraception: post menopausal  Menopausal hormone therapy: none        OB History    Gravida  2   Para  2   Term  2   Preterm  0   AB  0   Living  2     SAB  0   TAB  0   Ectopic  0   Multiple  0   Live Births  2              Patient Active Problem List   Diagnosis Date Noted  . Family history of colon cancer in father 02/14/2018  . GERD (gastroesophageal reflux disease) 02/14/2018  . History of adenomatous polyp of colon 02/14/2018  . Transaminitis 02/14/2018  . Sprain of right ankle 01/14/2018  . Hypercalcemia 01/17/2017  . 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 Kidney stone   . Fatty infiltration of liver 01/18/2015  . Vitamin D deficiency 07/27/2014  . 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  . Monoclonal gammopathy of unknown significance (MGUS) 01/05/2012    Past Medical History:  Diagnosis Date  . Colon polyps 2003  . Detached retina   . Kidney stone 2016  . Left bundle branch block 2012  . Menorrhagia   . MGUS (monoclonal gammopathy of unknown significance)   . Plantar fasciitis   . Precancerous lesion 1987   mole excised from vulva  . PVD (posterior vitreous detachment), right 09/07/2015    Past Surgical History:  Procedure Laterality Date  . ABLATION  11/10/02   Hysteroscopic Thermal  . cyst on scalp     sebaccous(multiple times as a teen)  . gastro surgery  2012  . LAPAROSCOPIC CHOLECYSTECTOMY  11/2001  . left leg muscle surgery  2012  . OTHER SURGICAL HISTORY     Sebaceous cyst on head removed 7x's    Current Outpatient  Medications  Medication Sig Dispense Refill  . Calcium Carbonate (CALTRATE 600 PO) Take by mouth.    . carboxymethylcellulose (REFRESH PLUS) 0.5 % SOLN Apply to eye.    . cholecalciferol (VITAMIN D) 1000 UNITS tablet Take 1,000 Units by mouth daily.    Marland Kitchen ELDERBERRY PO Take by mouth.    . loratadine (CLARITIN) 10 MG tablet Take 10 mg by mouth daily.    . metroNIDAZOLE (METROGEL) 0.75 % gel metronidazole 0.75 % topical gel  APPLY TO AFFECTED AREA EVERY DAY    . Milk Thistle 300 MG CAPS Take by mouth.    . Multiple Vitamins-Minerals (WOMENS MULTIVITAMIN PLUS PO) Take 1 tablet by mouth daily.    Marland Kitchen omeprazole (PRILOSEC) 20 MG capsule Take by mouth daily.  3  . tretinoin (RETIN-A) 0.05 % cream tretinoin 0.05 % topical cream  APPLY USING 1 APPLICATION TO AFFECTED AREA IN THE EVENING TO FACE ONCE A DAY EXTERNALLY(FOR ACNE)    . vitamin C (ASCORBIC ACID) 500 MG tablet Take 500 mg by mouth daily.     No current facility-administered medications for this visit.     ALLERGIES: Other, Penicillins, Gold sodium thiosulfate, and Nickel  Family History  Problem Relation Age of  Onset  . Colon cancer Father 12  . Alzheimer's disease Father   . Emphysema Father   . COPD Father   . Heart attack Mother   . Cancer Brother        peritod gland cancer  . Breast cancer Maternal Aunt        post menopause    Social History   Socioeconomic History  . Marital status: Married    Spouse name: Not on file  . Number of children: 2  . Years of education: M.ED.  Marland Kitchen Highest education level: Not on file  Occupational History    Comment: [employer]  Tobacco Use  . Smoking status: Never Smoker  . Smokeless tobacco: Never Used  Substance and Sexual Activity  . Alcohol use: Yes    Alcohol/week: 3.0 standard drinks    Types: 3 Glasses of wine per week  . Drug use: No  . Sexual activity: Not Currently    Partners: Male    Birth control/protection: Post-menopausal  Other Topics Concern  . Not on  file  Social History Narrative   2 cups of coffee a day    Social Determinants of Health   Financial Resource Strain:   . Difficulty of Paying Living Expenses:   Food Insecurity:   . Worried About Charity fundraiser in the Last Year:   . Arboriculturist in the Last Year:   Transportation Needs:   . Film/video editor (Medical):   Marland Kitchen Lack of Transportation (Non-Medical):   Physical Activity:   . Days of Exercise per Week:   . Minutes of Exercise per Session:   Stress:   . Feeling of Stress :   Social Connections:   . Frequency of Communication with Friends and Family:   . Frequency of Social Gatherings with Friends and Family:   . Attends Religious Services:   . Active Member of Clubs or Organizations:   . Attends Archivist Meetings:   Marland Kitchen Marital Status:   Intimate Partner Violence:   . Fear of Current or Ex-Partner:   . Emotionally Abused:   Marland Kitchen Physically Abused:   . Sexually Abused:     Review of Systems  Constitutional: Negative.   HENT: Negative.   Eyes: Negative.   Respiratory: Negative.   Cardiovascular: Negative.   Gastrointestinal: Negative.   Genitourinary:       Tender lump in peri-rectal area  Musculoskeletal: Negative.   Skin: Negative.   Neurological: Negative.   Endo/Heme/Allergies: Negative.   Psychiatric/Behavioral: Negative.     PHYSICAL EXAMINATION:    BP 122/70 (BP Location: Right Arm, Patient Position: Sitting, Cuff Size: Normal)   Pulse 84   Temp (!) 97.2 F (36.2 C) (Skin)   Resp 14   Ht 5' 4.5" (1.638 m)   Wt 169 lb 6.4 oz (76.8 kg)   LMP 08/01/2007 (Approximate)   BMI 28.63 kg/m     General appearance: alert, cooperative and appears stated age  Pelvic: External genitalia:  no lesions              Small boil on the inner right buttock, coming to a head  Chaperone was present for exam.  ASSESSMENT Boil on right buttock, small coming to a head    PLAN Warm compresses, hot soaks.    An After Visit Summary was  printed and given to the patient.

## 2019-10-16 ENCOUNTER — Ambulatory Visit: Payer: 59 | Attending: Internal Medicine

## 2019-10-16 DIAGNOSIS — Z23 Encounter for immunization: Secondary | ICD-10-CM

## 2019-10-16 NOTE — Progress Notes (Signed)
   Covid-19 Vaccination Clinic  Name:  Erika Bolton    MRN: [id number] DOB: 03/01/1956  10/16/2019  [name] was observed post Covid-19 immunization for 30 minutes based on pre-vaccination screening without incident. She was provided with Vaccine Information Sheet and instruction to access the V-Safe system.   [name] was instructed to call 911 with any severe reactions post vaccine: Marland Kitchen Difficulty breathing  . Swelling of face and throat  . A fast heartbeat  . A bad rash all over body  . Dizziness and weakness   Immunizations Administered    Name Date Dose VIS Date Route   Pfizer COVID-19 Vaccine 10/16/2019  9:54 AM 0.3 mL 07/11/2019 Intramuscular   Manufacturer: [place]   Lot: [id number]   [place]: [id number]

## 2019-10-21 ENCOUNTER — Encounter: Payer: Self-pay | Admitting: Certified Nurse Midwife

## 2019-11-10 ENCOUNTER — Ambulatory Visit: Payer: 59 | Attending: Internal Medicine

## 2019-11-10 DIAGNOSIS — Z23 Encounter for immunization: Secondary | ICD-10-CM

## 2019-11-10 NOTE — Progress Notes (Signed)
   Covid-19 Vaccination Clinic  Name:  Erika Bolton    MRN: [id number] DOB: 1956-02-25  11/10/2019  Erika Bolton was observed post Covid-19 immunization for 30 minutes based on pre-vaccination screening without incident. She was provided with Vaccine Information Sheet and instruction to access the V-Safe system.   Erika Bolton was instructed to call 911 with any severe reactions post vaccine: Marland Kitchen Difficulty breathing  . Swelling of face and throat  . A fast heartbeat  . A bad rash all over body  . Dizziness and weakness   Immunizations Administered    Name Date Dose VIS Date Route   Pfizer COVID-19 Vaccine 11/10/2019 11:37 AM 0.3 mL 07/11/2019 Intramuscular   Manufacturer: [place]   Lot: [id number]   [place]: [id number]

## 2020-01-09 ENCOUNTER — Other Ambulatory Visit: Payer: Self-pay

## 2020-01-09 ENCOUNTER — Inpatient Hospital Stay: Payer: 59 | Attending: Hematology and Oncology

## 2020-01-09 DIAGNOSIS — Z79899 Other long term (current) drug therapy: Secondary | ICD-10-CM | POA: Diagnosis not present

## 2020-01-09 DIAGNOSIS — Z880 Allergy status to penicillin: Secondary | ICD-10-CM | POA: Insufficient documentation

## 2020-01-09 DIAGNOSIS — E559 Vitamin D deficiency, unspecified: Secondary | ICD-10-CM | POA: Insufficient documentation

## 2020-01-09 DIAGNOSIS — D472 Monoclonal gammopathy: Secondary | ICD-10-CM | POA: Diagnosis not present

## 2020-01-09 LAB — CBC WITH DIFFERENTIAL/PLATELET
Abs Immature Granulocytes: 0.03 10*3/uL (ref 0.00–0.07)
Basophils Absolute: 0.1 10*3/uL (ref 0.0–0.1)
Basophils Relative: 1 %
Eosinophils Absolute: 0.2 10*3/uL (ref 0.0–0.5)
Eosinophils Relative: 3 %
HCT: 44 % (ref 36.0–46.0)
Hemoglobin: 14.5 g/dL (ref 12.0–15.0)
Immature Granulocytes: 1 %
Lymphocytes Relative: 22 %
Lymphs Abs: 1.3 10*3/uL (ref 0.7–4.0)
MCH: 30 pg (ref 26.0–34.0)
MCHC: 33 g/dL (ref 30.0–36.0)
MCV: 90.9 fL (ref 80.0–100.0)
Monocytes Absolute: 0.5 10*3/uL (ref 0.1–1.0)
Monocytes Relative: 8 %
Neutro Abs: 3.9 10*3/uL (ref 1.7–7.7)
Neutrophils Relative %: 65 %
Platelets: 255 10*3/uL (ref 150–400)
RBC: 4.84 MIL/uL (ref 3.87–5.11)
RDW: 12.8 % (ref 11.5–15.5)
WBC: 5.9 10*3/uL (ref 4.0–10.5)
nRBC: 0 % (ref 0.0–0.2)

## 2020-01-09 LAB — COMPREHENSIVE METABOLIC PANEL
ALT: 39 U/L (ref 0–44)
AST: 32 U/L (ref 15–41)
Albumin: 4.1 g/dL (ref 3.5–5.0)
Alkaline Phosphatase: 93 U/L (ref 38–126)
Anion gap: 8 (ref 5–15)
BUN: 11 mg/dL (ref 8–23)
CO2: 26 mmol/L (ref 22–32)
Calcium: 10.3 mg/dL (ref 8.9–10.3)
Chloride: 108 mmol/L (ref 98–111)
Creatinine, Ser: 0.78 mg/dL (ref 0.44–1.00)
GFR calc Af Amer: >60 mL/min (ref >60)
GFR calc non Af Amer: >60 mL/min (ref >60)
Glucose, Bld: 107 mg/dL — ABNORMAL HIGH (ref 70–99)
Potassium: 4.1 mmol/L (ref 3.5–5.1)
Sodium: 142 mmol/L (ref 135–145)
Total Bilirubin: 0.6 mg/dL (ref 0.3–1.2)
Total Protein: 7.5 g/dL (ref 6.5–8.1)

## 2020-01-09 LAB — VITAMIN D 25 HYDROXY (VIT D DEFICIENCY, FRACTURES): Vit D, 25-Hydroxy: 35.45 ng/mL (ref 30–100)

## 2020-01-12 LAB — KAPPA/LAMBDA LIGHT CHAINS
Kappa free light chain: 22.2 mg/L — ABNORMAL HIGH (ref 3.3–19.4)
Kappa, lambda light chain ratio: 3.17 — ABNORMAL HIGH (ref 0.26–1.65)
Lambda free light chains: 7 mg/L (ref 5.7–26.3)

## 2020-01-13 LAB — MULTIPLE MYELOMA PANEL, SERUM
Albumin SerPl Elph-Mcnc: 3.8 g/dL (ref 2.9–4.4)
Albumin/Glob SerPl: 1.2 (ref 0.7–1.7)
Alpha 1: 0.3 g/dL (ref 0.0–0.4)
Alpha2 Glob SerPl Elph-Mcnc: 0.8 g/dL (ref 0.4–1.0)
B-Globulin SerPl Elph-Mcnc: 1.1 g/dL (ref 0.7–1.3)
Gamma Glob SerPl Elph-Mcnc: 1.2 g/dL (ref 0.4–1.8)
Globulin, Total: 3.3 g/dL (ref 2.2–3.9)
IgA: 52 mg/dL — ABNORMAL LOW (ref 87–352)
IgG (Immunoglobin G), Serum: 1199 mg/dL (ref 586–1602)
IgM (Immunoglobulin M), Srm: 31 mg/dL (ref 26–217)
M Protein SerPl Elph-Mcnc: 0.9 g/dL — ABNORMAL HIGH
Total Protein ELP: 7.1 g/dL (ref 6.0–8.5)

## 2020-01-16 ENCOUNTER — Encounter: Payer: Self-pay | Admitting: Hematology and Oncology

## 2020-01-16 ENCOUNTER — Other Ambulatory Visit: Payer: Self-pay

## 2020-01-16 ENCOUNTER — Inpatient Hospital Stay: Payer: 59 | Admitting: Hematology and Oncology

## 2020-01-16 DIAGNOSIS — E559 Vitamin D deficiency, unspecified: Secondary | ICD-10-CM

## 2020-01-16 DIAGNOSIS — D472 Monoclonal gammopathy: Secondary | ICD-10-CM | POA: Diagnosis not present

## 2020-01-16 NOTE — Assessment & Plan Note (Signed)
She has intermittent vitamin D deficiency We discussed the importance of bone health I recommend vitamin D 2000 units daily

## 2020-01-16 NOTE — Progress Notes (Signed)
[facility] OFFICE PROGRESS NOTE  Patient Care Team: Lavone Orn, MD as PCP - General (Internal Medicine) [name], MD as Consulting Physician (Gastroenterology)  ASSESSMENT & PLAN:  Monoclonal gammopathy of unknown significance (MGUS) Her blood work is stable with no signs of progression, renal failure, hypercalcemia or anemia Clinically, she has no signs of disease progression. I will see her on a yearly basis with history, physical examination, blood work to be done 1 week ahead of time prior to her next year return visit. I emphasized the importance of annual influenza vaccination.  Vitamin D deficiency She has intermittent vitamin D deficiency We discussed the importance of bone health I recommend vitamin D 2000 units daily   Orders Placed This Encounter  Procedures  . Comprehensive metabolic panel    Standing Status:   Standing    Number of Occurrences:   33    Standing Expiration Date:   01/15/2021  . CBC with Differential/Platelet    Standing Status:   Standing    Number of Occurrences:   22    Standing Expiration Date:   01/15/2021  . Kappa/lambda light chains    Standing Status:   Standing    Number of Occurrences:   22    Standing Expiration Date:   01/15/2021  . Multiple Myeloma Panel (SPEP&IFE w/QIG)    Standing Status:   Standing    Number of Occurrences:   22    Standing Expiration Date:   01/15/2021  . VITAMIN D 25 Hydroxy (Vit-D Deficiency, Fractures)    Standing Status:   Future    Standing Expiration Date:   01/15/2021    All questions were answered. The patient knows to call the clinic with any problems, questions or concerns. The total time spent in the appointment was 15 minutes encounter with patients including review of chart and various tests results, discussions about plan of care and coordination of care plan   Heath Lark, MD 01/16/2020 9:29 AM  INTERVAL HISTORY: Please see below for problem oriented charting. She returns  for further follow-up She feels well No recent infection She is up-to-date with Covid vaccination and influenza vaccination  SUMMARY OF ONCOLOGIC HISTORY: Erika Bolton was transferred to my care after her prior physician has left.  I reviewed the patient's records extensive and collaborated the history with the patient. Summary of her history is as follows: This patient was discovered to have MGUS after she was found to have abnormal blood tests. Blood work revealed IgG kappa She was observed.  REVIEW OF SYSTEMS:   Constitutional: Denies fevers, chills or abnormal weight loss Eyes: Denies blurriness of vision Ears, nose, mouth, throat, and face: Denies mucositis or sore throat Respiratory: Denies cough, dyspnea or wheezes Cardiovascular: Denies palpitation, chest discomfort or lower extremity swelling Gastrointestinal:  Denies nausea, heartburn or change in bowel habits Skin: Denies abnormal skin rashes Lymphatics: Denies new lymphadenopathy or easy bruising Neurological:Denies numbness, tingling or new weaknesses Behavioral/Psych: Mood is stable, no new changes  All other systems were reviewed with the patient and are negative.  I have reviewed the past medical history, past surgical history, social history and family history with the patient and they are unchanged from previous note.  ALLERGIES:  is allergic to other, penicillins, gold sodium thiosulfate, and nickel.  MEDICATIONS:  Current Outpatient Medications  Medication Sig Dispense Refill  . Calcium Carbonate (CALTRATE 600 PO) Take by mouth.    . carboxymethylcellulose (REFRESH PLUS) 0.5 % SOLN Apply to  eye.    . cholecalciferol (VITAMIN D) 1000 UNITS tablet Take 1,000 Units by mouth daily.    Marland Kitchen ELDERBERRY PO Take by mouth.    . loratadine (CLARITIN) 10 MG tablet Take 10 mg by mouth daily.    . metroNIDAZOLE (METROGEL) 0.75 % gel metronidazole 0.75 % topical gel  APPLY TO AFFECTED AREA EVERY DAY    . Milk Thistle 300  MG CAPS Take by mouth.    . Multiple Vitamins-Minerals (WOMENS MULTIVITAMIN PLUS PO) Take 1 tablet by mouth daily.    Marland Kitchen omeprazole (PRILOSEC) 20 MG capsule Take by mouth daily.  3  . tretinoin (RETIN-A) 0.05 % cream tretinoin 0.05 % topical cream  APPLY USING 1 APPLICATION TO AFFECTED AREA IN THE EVENING TO FACE ONCE A DAY EXTERNALLY(FOR ACNE)    . vitamin C (ASCORBIC ACID) 500 MG tablet Take 500 mg by mouth daily.     No current facility-administered medications for this visit.    PHYSICAL EXAMINATION: ECOG PERFORMANCE STATUS: 0 - Asymptomatic  Vitals:   01/16/20 0917  BP: 128/64  Pulse: 80  Resp: 18  Temp: 97.7 F (36.5 C)  SpO2: 100%   Filed Weights   01/16/20 0917  Weight: 169 lb (76.7 kg)    GENERAL:alert, no distress and comfortable  LABORATORY DATA:  I have reviewed the data as listed    Component Value Date/Time   NA 142 01/09/2020 1115   NA 140 01/09/2017 1523   K 4.1 01/09/2020 1115   K 4.1 01/09/2017 1523   CL 108 01/09/2020 1115   CL 109 (H) 01/10/2013 1446   CO2 26 01/09/2020 1115   CO2 23 01/09/2017 1523   GLUCOSE 107 (H) 01/09/2020 1115   GLUCOSE 94 01/09/2017 1523   GLUCOSE 116 (H) 01/10/2013 1446   BUN 11 01/09/2020 1115   BUN 14.1 01/09/2017 1523   CREATININE 0.78 01/09/2020 1115   CREATININE 0.7 01/09/2017 1523   CALCIUM 10.3 01/09/2020 1115   CALCIUM 10.5 (H) 01/09/2017 1523   PROT 7.5 01/09/2020 1115   PROT 7.1 01/09/2017 1523   PROT 7.5 01/09/2017 1523   ALBUMIN 4.1 01/09/2020 1115   ALBUMIN 4.0 01/09/2017 1523   AST 32 01/09/2020 1115   AST 27 01/09/2017 1523   ALT 39 01/09/2020 1115   ALT 31 01/09/2017 1523   ALKPHOS 93 01/09/2020 1115   ALKPHOS 94 01/09/2017 1523   BILITOT 0.6 01/09/2020 1115   BILITOT 0.45 01/09/2017 1523   GFRNONAA >60 01/09/2020 1115   GFRAA >60 01/09/2020 1115    No results found for: SPEP, UPEP  Lab Results  Component Value Date   WBC 5.9 01/09/2020   NEUTROABS 3.9 01/09/2020   HGB 14.5 01/09/2020    HCT 44.0 01/09/2020   MCV 90.9 01/09/2020   PLT 255 01/09/2020      Chemistry      Component Value Date/Time   NA 142 01/09/2020 1115   NA 140 01/09/2017 1523   K 4.1 01/09/2020 1115   K 4.1 01/09/2017 1523   CL 108 01/09/2020 1115   CL 109 (H) 01/10/2013 1446   CO2 26 01/09/2020 1115   CO2 23 01/09/2017 1523   BUN 11 01/09/2020 1115   BUN 14.1 01/09/2017 1523   CREATININE 0.78 01/09/2020 1115   CREATININE 0.7 01/09/2017 1523      Component Value Date/Time   CALCIUM 10.3 01/09/2020 1115   CALCIUM 10.5 (H) 01/09/2017 1523   ALKPHOS 93 01/09/2020 1115   ALKPHOS 94 01/09/2017  1523   AST 32 01/09/2020 1115   AST 27 01/09/2017 1523   ALT 39 01/09/2020 1115   ALT 31 01/09/2017 1523   BILITOT 0.6 01/09/2020 1115   BILITOT 0.45 01/09/2017 1523

## 2020-01-16 NOTE — Assessment & Plan Note (Signed)
Her blood work is stable with no signs of progression, renal failure, hypercalcemia or anemia Clinically, she has no signs of disease progression. I will see her on a yearly basis with history, physical examination, blood work to be done 1 week ahead of time prior to her next year return visit. I emphasized the importance of annual influenza vaccination.

## 2020-02-05 NOTE — Progress Notes (Signed)
64 y.o. G65P2002 Married White or Caucasian Not Hispanic or Latino female here for annual exam.  No vaginal bleeding. Sexually active, no pain.  No bowel or bladder changes.     She just had a dental implant.  Patient's last menstrual period was 08/01/2007 (approximate).          Sexually active: Yes.    The current method of family planning is post menopausal status.    Exercising: Yes.    Walking  Smoker:  no  Health Maintenance: Pap:  01/15/19 negative,  11-07-17 neg HPV HR neg,  09-12-16 neg HPV HR neg  History of abnormal Pap:  Yes, f/u normal.  MMG:  08/19/19 Density B Bi-rads 2 benign  BMD:  12/ 2018 T score -1.2, FRAX 7/0.5 Colonoscopy: 05/28/18 f/u 5 years TDaP:  2018 Gardasil: NA   reports that she has never smoked. She has never used smokeless tobacco. She reports current alcohol use of about 3.0 standard drinks of alcohol per week. She reports that she does not use drugs. Works in [employer]. Plans to retire in 1/23. 2 grown children, 50 year old grandson.    Past Medical History:  Diagnosis Date  . Colon polyps 2003  . Detached retina   . Kidney stone 2016  . Left bundle branch block 2012  . Menorrhagia   . MGUS (monoclonal gammopathy of unknown significance)   . Plantar fasciitis   . Precancerous lesion 1987   mole excised from vulva  . PVD (posterior vitreous detachment), right 09/07/2015    Past Surgical History:  Procedure Laterality Date  . ABLATION  11/10/02   Hysteroscopic Thermal  . cyst on scalp     sebaccous(multiple times as a teen)  . gastro surgery  2012  . LAPAROSCOPIC CHOLECYSTECTOMY  11/2001  . left leg muscle surgery  2012  . OTHER SURGICAL HISTORY     Sebaceous cyst on head removed 7x's    Current Outpatient Medications  Medication Sig Dispense Refill  . Calcium Carbonate (CALTRATE 600 PO) Take by mouth.    . carboxymethylcellulose (REFRESH PLUS) 0.5 % SOLN Apply to eye.    . cholecalciferol (VITAMIN D) 1000 UNITS  tablet Take 1,000 Units by mouth daily.    Marland Kitchen ELDERBERRY PO Take by mouth.    . loratadine (CLARITIN) 10 MG tablet Take 10 mg by mouth daily.    . metroNIDAZOLE (METROGEL) 0.75 % gel metronidazole 0.75 % topical gel  APPLY TO AFFECTED AREA EVERY DAY    . Milk Thistle 300 MG CAPS Take by mouth.    . Multiple Vitamins-Minerals (WOMENS MULTIVITAMIN PLUS PO) Take 1 tablet by mouth daily.    Marland Kitchen omeprazole (PRILOSEC) 20 MG capsule Take by mouth daily.  3  . tretinoin (RETIN-A) 0.05 % cream tretinoin 0.05 % topical cream  APPLY USING 1 APPLICATION TO AFFECTED AREA IN THE EVENING TO FACE ONCE A DAY EXTERNALLY(FOR ACNE)    . vitamin C (ASCORBIC ACID) 500 MG tablet Take 500 mg by mouth daily.     No current facility-administered medications for this visit.    Family History  Problem Relation Age of Onset  . Colon cancer Father 52  . Alzheimer's disease Father   . Emphysema Father   . COPD Father   . Heart attack Mother   . Cancer Brother        peritod gland cancer  . Breast cancer Maternal Aunt        post menopause  Review of Systems  All other systems reviewed and are negative.   Exam:   LMP 08/01/2007 (Approximate)   Weight change: @WEIGHTCHANGE @ Height:      Ht Readings from Last 3 Encounters:  01/16/20 5' 4.5" (1.638 m)  10/14/19 5' 4.5" (1.638 m)  01/16/19 5' 4.25" (1.632 m)    General appearance: alert, cooperative and appears stated age Head: Normocephalic, without obvious abnormality, atraumatic Neck: no adenopathy, supple, symmetrical, trachea midline and thyroid normal to inspection and palpation Lungs: clear to auscultation bilaterally Cardiovascular: regular rate and rhythm Breasts: normal appearance, no masses or tenderness Abdomen: soft, non-tender; non distended,  no masses,  no organomegaly Extremities: extremities normal, atraumatic, no cyanosis or edema Skin: Skin color, texture, turgor normal. No rashes or lesions Lymph nodes: Cervical, supraclavicular, and  axillary nodes normal. No abnormal inguinal nodes palpated Neurologic: Grossly normal   Pelvic: External genitalia:  no lesions              Urethra:  normal appearing urethra with no masses, tenderness or lesions              Bartholins and Skenes: normal                 Vagina: normal appearing vagina with normal color and discharge, no lesions              Cervix: no lesions               Bimanual Exam:  Uterus:  normal size, contour, position, consistency, mobility, non-tender              Adnexa: no mass, fullness, tenderness               Rectovaginal: Confirms               Anus:  normal sphincter tone, no lesions  Gae Dry chaperoned for the exam.  A:  Well Woman with normal exam  Mild osteopenia  P:   No pap this year, pap due in 2024  Mammogram UTD  Colonoscopy UTD  Plan DEXA in 2023  Discussed breast self exam  Discussed calcium and vit D intake  Labs are UTD

## 2020-02-06 ENCOUNTER — Other Ambulatory Visit: Payer: Self-pay

## 2020-02-06 ENCOUNTER — Ambulatory Visit: Payer: 59 | Admitting: Obstetrics and Gynecology

## 2020-02-06 ENCOUNTER — Encounter: Payer: Self-pay | Admitting: Obstetrics and Gynecology

## 2020-02-06 VITALS — BP 122/74 | HR 87 | Ht 64.5 in | Wt 168.0 lb

## 2020-02-06 DIAGNOSIS — Z01419 Encounter for gynecological examination (general) (routine) without abnormal findings: Secondary | ICD-10-CM | POA: Diagnosis not present

## 2020-02-06 DIAGNOSIS — Z8739 Personal history of other diseases of the musculoskeletal system and connective tissue: Secondary | ICD-10-CM | POA: Diagnosis not present

## 2020-02-06 NOTE — Patient Instructions (Signed)

## 2020-06-28 ENCOUNTER — Other Ambulatory Visit: Payer: 59

## 2020-09-13 ENCOUNTER — Telehealth: Payer: Self-pay | Admitting: Neurology

## 2020-09-13 NOTE — Telephone Encounter (Signed)
Informed pt that we would need new referral from primary care before she can come in and be seen/have new prescription. Pt verbalized understanding.

## 2020-09-13 NOTE — Telephone Encounter (Signed)
Pt. Called today to see if Dr. Rexene Alberts could write her a prescription for a new mouth guard. It is being replaced because she has a new dental implant. Best call back is [phone]

## 2020-12-10 ENCOUNTER — Telehealth: Payer: Self-pay | Admitting: Hematology and Oncology

## 2020-12-10 NOTE — Telephone Encounter (Signed)
R/s appt per 5/13 sch msg. Pt aware.  

## 2020-12-24 ENCOUNTER — Telehealth: Payer: Self-pay | Admitting: Hematology and Oncology

## 2020-12-24 NOTE — Telephone Encounter (Signed)
Rescheduled appointment per provider. Left a detailed message.  

## 2021-01-07 ENCOUNTER — Other Ambulatory Visit: Payer: 59

## 2021-01-14 ENCOUNTER — Other Ambulatory Visit: Payer: Self-pay

## 2021-01-14 ENCOUNTER — Inpatient Hospital Stay: Payer: 59 | Attending: Hematology and Oncology

## 2021-01-14 ENCOUNTER — Ambulatory Visit: Payer: 59 | Admitting: Hematology and Oncology

## 2021-01-14 DIAGNOSIS — K760 Fatty (change of) liver, not elsewhere classified: Secondary | ICD-10-CM | POA: Insufficient documentation

## 2021-01-14 DIAGNOSIS — E559 Vitamin D deficiency, unspecified: Secondary | ICD-10-CM | POA: Diagnosis not present

## 2021-01-14 DIAGNOSIS — D472 Monoclonal gammopathy: Secondary | ICD-10-CM | POA: Diagnosis not present

## 2021-01-14 DIAGNOSIS — Z79899 Other long term (current) drug therapy: Secondary | ICD-10-CM | POA: Insufficient documentation

## 2021-01-14 LAB — VITAMIN D 25 HYDROXY (VIT D DEFICIENCY, FRACTURES): Vit D, 25-Hydroxy: 22.67 ng/mL — ABNORMAL LOW (ref 30–100)

## 2021-01-14 LAB — CBC WITH DIFFERENTIAL/PLATELET
Abs Immature Granulocytes: 0.02 10*3/uL (ref 0.00–0.07)
Basophils Absolute: 0.1 10*3/uL (ref 0.0–0.1)
Basophils Relative: 1 %
Eosinophils Absolute: 0.4 10*3/uL (ref 0.0–0.5)
Eosinophils Relative: 5 %
HCT: 40.7 % (ref 36.0–46.0)
Hemoglobin: 14 g/dL (ref 12.0–15.0)
Immature Granulocytes: 0 %
Lymphocytes Relative: 22 %
Lymphs Abs: 1.5 10*3/uL (ref 0.7–4.0)
MCH: 30 pg (ref 26.0–34.0)
MCHC: 34.4 g/dL (ref 30.0–36.0)
MCV: 87.3 fL (ref 80.0–100.0)
Monocytes Absolute: 0.6 10*3/uL (ref 0.1–1.0)
Monocytes Relative: 9 %
Neutro Abs: 4.1 10*3/uL (ref 1.7–7.7)
Neutrophils Relative %: 63 %
Platelets: 240 10*3/uL (ref 150–400)
RBC: 4.66 MIL/uL (ref 3.87–5.11)
RDW: 12.8 % (ref 11.5–15.5)
WBC: 6.6 10*3/uL (ref 4.0–10.5)
nRBC: 0 % (ref 0.0–0.2)

## 2021-01-14 LAB — CMP (CANCER CENTER ONLY)
ALT: 47 U/L — ABNORMAL HIGH (ref 0–44)
AST: 39 U/L (ref 15–41)
Albumin: 4.2 g/dL (ref 3.5–5.0)
Alkaline Phosphatase: 74 U/L (ref 38–126)
Anion gap: 6 (ref 5–15)
BUN: 16 mg/dL (ref 8–23)
CO2: 23 mmol/L (ref 22–32)
Calcium: 10.2 mg/dL (ref 8.9–10.3)
Chloride: 109 mmol/L (ref 98–111)
Creatinine: 0.59 mg/dL (ref 0.44–1.00)
GFR, Estimated: >60 mL/min (ref >60)
Glucose, Bld: 101 mg/dL — ABNORMAL HIGH (ref 70–99)
Potassium: 4 mmol/L (ref 3.5–5.1)
Sodium: 138 mmol/L (ref 135–145)
Total Bilirubin: 0.4 mg/dL (ref 0.3–1.2)
Total Protein: 7.3 g/dL (ref 6.5–8.1)

## 2021-01-17 LAB — KAPPA/LAMBDA LIGHT CHAINS
Kappa free light chain: 20.8 mg/L — ABNORMAL HIGH (ref 3.3–19.4)
Kappa, lambda light chain ratio: 3.25 — ABNORMAL HIGH (ref 0.26–1.65)
Lambda free light chains: 6.4 mg/L (ref 5.7–26.3)

## 2021-01-18 LAB — MULTIPLE MYELOMA PANEL, SERUM
Albumin SerPl Elph-Mcnc: 4 g/dL (ref 2.9–4.4)
Albumin/Glob SerPl: 1.4 (ref 0.7–1.7)
Alpha 1: 0.2 g/dL (ref 0.0–0.4)
Alpha2 Glob SerPl Elph-Mcnc: 0.8 g/dL (ref 0.4–1.0)
B-Globulin SerPl Elph-Mcnc: 1 g/dL (ref 0.7–1.3)
Gamma Glob SerPl Elph-Mcnc: 1 g/dL (ref 0.4–1.8)
Globulin, Total: 3 g/dL (ref 2.2–3.9)
IgA: 47 mg/dL — ABNORMAL LOW (ref 87–352)
IgG (Immunoglobin G), Serum: 1139 mg/dL (ref 586–1602)
IgM (Immunoglobulin M), Srm: 32 mg/dL (ref 26–217)
M Protein SerPl Elph-Mcnc: 0.8 g/dL — ABNORMAL HIGH
Total Protein ELP: 7 g/dL (ref 6.0–8.5)

## 2021-01-21 ENCOUNTER — Inpatient Hospital Stay: Payer: 59 | Admitting: Hematology and Oncology

## 2021-01-21 ENCOUNTER — Other Ambulatory Visit: Payer: Self-pay

## 2021-01-21 ENCOUNTER — Encounter: Payer: Self-pay | Admitting: Hematology and Oncology

## 2021-01-21 DIAGNOSIS — E559 Vitamin D deficiency, unspecified: Secondary | ICD-10-CM

## 2021-01-21 DIAGNOSIS — K760 Fatty (change of) liver, not elsewhere classified: Secondary | ICD-10-CM

## 2021-01-21 DIAGNOSIS — D472 Monoclonal gammopathy: Secondary | ICD-10-CM | POA: Diagnosis not present

## 2021-01-21 NOTE — Assessment & Plan Note (Signed)
She has intermittent vitamin D deficiency We discussed the importance of bone health I recommend vitamin D 2000 units daily

## 2021-01-21 NOTE — Assessment & Plan Note (Signed)
She has intermittent elevated liver enzymes likely due to fatty liver disease I encouraged her to continue exercise program and weight loss as tolerated

## 2021-01-21 NOTE — Progress Notes (Signed)
[place] OFFICE PROGRESS NOTE  Patient Care Team: Lavone Orn, MD as PCP - General (Internal Medicine) [name], MD as Consulting Physician (Gastroenterology)  ASSESSMENT & PLAN:  Monoclonal gammopathy of unknown significance (MGUS) Her blood work is stable with no signs of progression, renal failure, hypercalcemia or anemia Clinically, she has no signs of disease progression. I will see her on a yearly basis with history, physical examination, blood work to be done 1 week ahead of time prior to her next year return visit.  Fatty infiltration of liver She has intermittent elevated liver enzymes likely due to fatty liver disease I encouraged her to continue exercise program and weight loss as tolerated  Vitamin D deficiency She has intermittent vitamin D deficiency We discussed the importance of bone health I recommend vitamin D 2000 units daily  No orders of the defined types were placed in this encounter.   All questions were answered. The patient knows to call the clinic with any problems, questions or concerns. The total time spent in the appointment was 20 minutes encounter with patients including review of chart and various tests results, discussions about plan of care and coordination of care plan   Heath Lark, MD 01/21/2021 2:22 PM  INTERVAL HISTORY: Please see below for problem oriented charting. She returns for further follow-up She is doing well She has no recent severe infection No new bone pain She has not been taking vitamin D consistently   SUMMARY OF ONCOLOGIC HISTORY: Erika Bolton was transferred to my care after her prior physician has left.  I reviewed the patient's records extensive and collaborated the history with the patient. Summary of her history is as follows: This patient was discovered to have MGUS after she was found to have abnormal blood tests. Blood work revealed IgG kappa She was observed.  REVIEW OF SYSTEMS:    Constitutional: Denies fevers, chills or abnormal weight loss Eyes: Denies blurriness of vision Ears, nose, mouth, throat, and face: Denies mucositis or sore throat Respiratory: Denies cough, dyspnea or wheezes Cardiovascular: Denies palpitation, chest discomfort or lower extremity swelling Gastrointestinal:  Denies nausea, heartburn or change in bowel habits Skin: Denies abnormal skin rashes Lymphatics: Denies new lymphadenopathy or easy bruising Neurological:Denies numbness, tingling or new weaknesses Behavioral/Psych: Mood is stable, no new changes  All other systems were reviewed with the patient and are negative.  I have reviewed the past medical history, past surgical history, social history and family history with the patient and they are unchanged from previous note.  ALLERGIES:  is allergic to other, penicillins, gold sodium thiosulfate, and nickel.  MEDICATIONS:  Current Outpatient Medications  Medication Sig Dispense Refill   Calcium Carbonate (CALTRATE 600 PO) Take by mouth.     carboxymethylcellulose (REFRESH PLUS) 0.5 % SOLN Apply to eye.     cholecalciferol (VITAMIN D) 1000 UNITS tablet Take 1,000 Units by mouth daily.     ELDERBERRY PO Take by mouth.     loratadine (CLARITIN) 10 MG tablet Take 10 mg by mouth daily.     metroNIDAZOLE (METROGEL) 0.75 % gel metronidazole 0.75 % topical gel  APPLY TO AFFECTED AREA EVERY DAY     Multiple Vitamins-Minerals (WOMENS MULTIVITAMIN PLUS PO) Take 1 tablet by mouth daily.     omeprazole (PRILOSEC) 20 MG capsule Take by mouth daily.  3   tretinoin (RETIN-A) 0.05 % cream tretinoin 0.05 % topical cream  APPLY USING 1 APPLICATION TO AFFECTED AREA IN THE EVENING TO FACE ONCE A  DAY EXTERNALLY(FOR ACNE)     No current facility-administered medications for this visit.    PHYSICAL EXAMINATION: ECOG PERFORMANCE STATUS: 0 - Asymptomatic  Vitals:   01/21/21 1000 01/21/21 1004  BP: (!) 156/76 (!) 142/73  Pulse: 89   Resp: 18   Temp:  98.8 F (37.1 C)    Filed Weights   01/21/21 1000  Weight: 173 lb 3.2 oz (78.6 kg)    GENERAL:alert, no distress and comfortable Musculoskeletal:no cyanosis of digits and no clubbing  NEURO: alert & oriented x 3 with fluent speech, no focal motor/sensory deficits  LABORATORY DATA:  I have reviewed the data as listed    Component Value Date/Time   NA 138 01/14/2021 1512   NA 140 01/09/2017 1523   K 4.0 01/14/2021 1512   K 4.1 01/09/2017 1523   CL 109 01/14/2021 1512   CL 109 (H) 01/10/2013 1446   CO2 23 01/14/2021 1512   CO2 23 01/09/2017 1523   GLUCOSE 101 (H) 01/14/2021 1512   GLUCOSE 94 01/09/2017 1523   GLUCOSE 116 (H) 01/10/2013 1446   BUN 16 01/14/2021 1512   BUN 14.1 01/09/2017 1523   CREATININE 0.59 01/14/2021 1512   CREATININE 0.7 01/09/2017 1523   CALCIUM 10.2 01/14/2021 1512   CALCIUM 10.5 (H) 01/09/2017 1523   PROT 7.3 01/14/2021 1512   PROT 7.1 01/09/2017 1523   PROT 7.5 01/09/2017 1523   ALBUMIN 4.2 01/14/2021 1512   ALBUMIN 4.0 01/09/2017 1523   AST 39 01/14/2021 1512   AST 27 01/09/2017 1523   ALT 47 (H) 01/14/2021 1512   ALT 31 01/09/2017 1523   ALKPHOS 74 01/14/2021 1512   ALKPHOS 94 01/09/2017 1523   BILITOT 0.4 01/14/2021 1512   BILITOT 0.45 01/09/2017 1523   GFRNONAA >60 01/14/2021 1512   GFRAA >60 01/09/2020 1115    No results found for: SPEP, UPEP  Lab Results  Component Value Date   WBC 6.6 01/14/2021   NEUTROABS 4.1 01/14/2021   HGB 14.0 01/14/2021   HCT 40.7 01/14/2021   MCV 87.3 01/14/2021   PLT 240 01/14/2021      Chemistry      Component Value Date/Time   NA 138 01/14/2021 1512   NA 140 01/09/2017 1523   K 4.0 01/14/2021 1512   K 4.1 01/09/2017 1523   CL 109 01/14/2021 1512   CL 109 (H) 01/10/2013 1446   CO2 23 01/14/2021 1512   CO2 23 01/09/2017 1523   BUN 16 01/14/2021 1512   BUN 14.1 01/09/2017 1523   CREATININE 0.59 01/14/2021 1512   CREATININE 0.7 01/09/2017 1523      Component Value Date/Time   CALCIUM  10.2 01/14/2021 1512   CALCIUM 10.5 (H) 01/09/2017 1523   ALKPHOS 74 01/14/2021 1512   ALKPHOS 94 01/09/2017 1523   AST 39 01/14/2021 1512   AST 27 01/09/2017 1523   ALT 47 (H) 01/14/2021 1512   ALT 31 01/09/2017 1523   BILITOT 0.4 01/14/2021 1512   BILITOT 0.45 01/09/2017 1523

## 2021-01-21 NOTE — Assessment & Plan Note (Signed)
Her blood work is stable with no signs of progression, renal failure, hypercalcemia or anemia Clinically, she has no signs of disease progression. I will see her on a yearly basis with history, physical examination, blood work to be done 1 week ahead of time prior to her next year return visit.

## 2021-02-14 NOTE — Progress Notes (Signed)
65 y.o. G11P2002 Married White or Caucasian Not Hispanic or Latino female here for annual exam.  Patient would like vitamin D check. She states that she had it check 6 weeks ago and it was 22.67. She was advised to start 2,000 IU of vit D daily.   Last week she had an eye exam, was diagnosed with cataracts and a retinal disorder (will need surgery).  No vaginal bleeding. No bowel or bladder c/o.  Occasionally sexually active, no pain.     Patient's last menstrual period was 08/01/2007 (approximate).          Sexually active: Yes.    The current method of family planning is post menopausal status.    Exercising: Yes.     Walking and yoga Smoker:  no  Health Maintenance: Pap:   01/15/19 negative (no HPV testing),  11-07-17 neg HPV HR neg,  09-12-16 neg HPV HR neg  History of abnormal Pap:  yes f/u normal  MMG:  07/2020 report requested BMD:   12/ 2018 T score -1.2, FRAX 7/0.5 Colonoscopy: 05/28/18 f/u 5 years TDaP:  2018 Gardasil: NA   reports that she has never smoked. She has never used smokeless tobacco. She reports current alcohol use of about 3.0 standard drinks of alcohol per week. She reports that she does not use drugs.Works in [employer]. Plans to retire in 1/23. 2 grown children. Daughter outside of [place] and Son in [place]. Daughter has a 20 year old son. Son just got married and bought a house.   Past Medical History:  Diagnosis Date   Colon polyps 2003   Detached retina    Kidney stone 2016   Left bundle branch block 2012   Menorrhagia    MGUS (monoclonal gammopathy of unknown significance)    Plantar fasciitis    Precancerous lesion 1987   mole excised from vulva   PVD (posterior vitreous detachment), right 09/07/2015    Past Surgical History:  Procedure Laterality Date   ABLATION  11/10/02   Hysteroscopic Thermal   cyst on scalp     sebaccous(multiple times as a teen)   gastro surgery  2012   LAPAROSCOPIC CHOLECYSTECTOMY  11/2001    left leg muscle surgery  2012   OTHER SURGICAL HISTORY     Sebaceous cyst on head removed 7x's    Current Outpatient Medications  Medication Sig Dispense Refill   Calcium Carbonate (CALTRATE 600 PO) Take by mouth.     carboxymethylcellulose (REFRESH PLUS) 0.5 % SOLN Apply to eye.     cholecalciferol (VITAMIN D) 1000 UNITS tablet Take 1,000 Units by mouth daily.     loratadine (CLARITIN) 10 MG tablet Take 10 mg by mouth daily.     metroNIDAZOLE (METROGEL) 0.75 % gel metronidazole 0.75 % topical gel  APPLY TO AFFECTED AREA EVERY DAY     Multiple Vitamins-Minerals (WOMENS MULTIVITAMIN PLUS PO) Take 1 tablet by mouth daily.     omeprazole (PRILOSEC) 20 MG capsule Take by mouth daily.  3   tretinoin (RETIN-A) 0.05 % cream tretinoin 0.05 % topical cream  APPLY USING 1 APPLICATION TO AFFECTED AREA IN THE EVENING TO FACE ONCE A DAY EXTERNALLY(FOR ACNE)     ALPRAZolam (XANAX) 0.25 MG tablet Take 0.125-0.25 mg by mouth daily as needed.     amLODipine (NORVASC) 2.5 MG tablet Take 2.5 mg by mouth daily.     No current facility-administered medications for this visit.    Family History  Problem Relation Age of  Onset   Colon cancer Father 50   Alzheimer's disease Father    Emphysema Father    COPD Father    Heart attack Mother    Cancer Brother        peritod gland cancer   Breast cancer Maternal Aunt        post menopause    Review of Systems  All other systems reviewed and are negative.  Exam:   BP 128/78   Pulse 88   Ht 5' 4.25" (1.632 m)   Wt 170 lb (77.1 kg)   LMP 08/01/2007 (Approximate)   SpO2 98%   BMI 28.95 kg/m   Weight change: @WEIGHTCHANGE @ Height:   Height: 5' 4.25" (163.2 cm)  Ht Readings from Last 3 Encounters:  02/16/21 5' 4.25" (1.632 m)  01/21/21 5' 4.5" (1.638 m)  02/06/20 5' 4.5" (1.638 m)    General appearance: alert, cooperative and appears stated age Head: Normocephalic, without obvious abnormality, atraumatic Neck: no adenopathy, supple, symmetrical,  trachea midline and thyroid normal to inspection and palpation Lungs: clear to auscultation bilaterally Cardiovascular: regular rate and rhythm Breasts: normal appearance, no masses or tenderness Abdomen: soft, non-tender; non distended,  no masses,  no organomegaly Extremities: extremities normal, atraumatic, no cyanosis or edema Skin: Skin color, texture, turgor normal. No rashes or lesions Lymph nodes: Cervical, supraclavicular, and axillary nodes normal. No abnormal inguinal nodes palpated Neurologic: Grossly normal   Pelvic: External genitalia:  no lesions              Urethra:  normal appearing urethra with no masses, tenderness or lesions              Bartholins and Skenes: normal                 Vagina: atrophic appearing vagina with normal color and discharge, no lesions              Cervix: no lesions               Bimanual Exam:  Uterus:  normal size, contour, position, consistency, mobility, non-tender              Adnexa: no mass, fullness, tenderness               Rectovaginal: Confirms               Anus:  normal sphincter tone, no lesions  Gae Dry chaperoned for the exam.  1. Well woman exam Discussed breast self exam No pap this year Mammogram in 1/23 Colonoscopy UTD  2. History of osteopenia Discussed calcium and vit D intake DEXA in 12/23  3. Vitamin D deficiency She is on 2,000 IU a day. Will return in 6 weeks for a vit d level (12 weeks after starting). - VITAMIN D 25 Hydroxy (Vit-D Deficiency, Fractures); Future

## 2021-02-16 ENCOUNTER — Encounter: Payer: Self-pay | Admitting: Obstetrics and Gynecology

## 2021-02-16 ENCOUNTER — Ambulatory Visit (INDEPENDENT_AMBULATORY_CARE_PROVIDER_SITE_OTHER): Payer: 59 | Admitting: Obstetrics and Gynecology

## 2021-02-16 ENCOUNTER — Other Ambulatory Visit: Payer: Self-pay

## 2021-02-16 VITALS — BP 128/78 | HR 88 | Ht 64.25 in | Wt 170.0 lb

## 2021-02-16 DIAGNOSIS — E559 Vitamin D deficiency, unspecified: Secondary | ICD-10-CM | POA: Diagnosis not present

## 2021-02-16 DIAGNOSIS — Z8739 Personal history of other diseases of the musculoskeletal system and connective tissue: Secondary | ICD-10-CM

## 2021-02-16 DIAGNOSIS — H35373 Puckering of macula, bilateral: Secondary | ICD-10-CM | POA: Insufficient documentation

## 2021-02-16 DIAGNOSIS — Z01419 Encounter for gynecological examination (general) (routine) without abnormal findings: Secondary | ICD-10-CM | POA: Diagnosis not present

## 2021-02-16 NOTE — Patient Instructions (Signed)

## 2021-02-22 NOTE — Progress Notes (Addendum)
[facility] Note  02/23/2021     CHIEF COMPLAINT Patient presents for Retina Evaluation   HISTORY OF PRESENT ILLNESS: Erika Bolton is a 65 y.o. female who presents to the clinic today for:   HPI     Retina Evaluation   In both eyes.  This started 1 week ago.  Duration of 1 week.  I, the attending physician,  performed the HPI with the patient and updated documentation appropriately.        Comments   New pt ret eval for ERM OU, OD>OS. Pt referred by Dr. Valetta Close. Pt has hx of cryo due to retinal tear OD. Pt currently has cataracts OU, OS>OD. Pt states she went to see Dr. Valetta Close for an annual exam. She does report vision was progressively getting worse and thought that was due to cataracts OU.       Last edited by Bernarda Caffey, MD on 02/23/2021  2:12 PM.      Referring physician: [name], MD [place], [place] [address]  HISTORICAL INFORMATION:   Selected notes from the MEDICAL RECORD NUMBER Referred by Dr. Valetta Close for ERM OU LEE: 02/15/2021 BCVA OD: 20/30 OS: 20/50 Ocular Hx- history of cryo for retinal tear OD, ERM, cataracts PMH- HTN, anxiety    CURRENT MEDICATIONS: Current Outpatient Medications (Ophthalmic Drugs)  Medication Sig   carboxymethylcellulose (REFRESH PLUS) 0.5 % SOLN Apply to eye.   No current facility-administered medications for this visit. (Ophthalmic Drugs)   Current Outpatient Medications (Other)  Medication Sig   ALPRAZolam (XANAX) 0.25 MG tablet Take 0.125-0.25 mg by mouth daily as needed.   amLODipine (NORVASC) 2.5 MG tablet Take 2.5 mg by mouth daily.   Calcium Carbonate (CALTRATE 600 PO) Take by mouth.   cholecalciferol (VITAMIN D) 1000 UNITS tablet Take 1,000 Units by mouth daily.   loratadine (CLARITIN) 10 MG tablet Take 10 mg by mouth daily.   metroNIDAZOLE (METROGEL) 0.75 % gel metronidazole 0.75 % topical gel  APPLY TO AFFECTED AREA EVERY DAY   Multiple Vitamins-Minerals (WOMENS  MULTIVITAMIN PLUS PO) Take 1 tablet by mouth daily.   omeprazole (PRILOSEC) 20 MG capsule Take by mouth daily.   tretinoin (RETIN-A) 0.05 % cream tretinoin 0.05 % topical cream  APPLY USING 1 APPLICATION TO AFFECTED AREA IN THE EVENING TO FACE ONCE A DAY EXTERNALLY(FOR ACNE)   No current facility-administered medications for this visit. (Other)      REVIEW OF SYSTEMS: ROS   Positive for: Cardiovascular, Eyes Last edited by Kingsley Spittle, COT on 02/23/2021  1:51 PM.       ALLERGIES Allergies  Allergen Reactions   Other Other (See Comments)    Potassium dichromate, broke out in dry rashes   Penicillins Swelling    Throat swelling   Gold Sodium Thiosulfate Rash   Nickel Rash    PAST MEDICAL HISTORY Past Medical History:  Diagnosis Date   Cataract    Colon polyps 2003   Detached retina    Hypertension    Kidney stone 2016   Left bundle branch block 2012   Menorrhagia    MGUS (monoclonal gammopathy of unknown significance)    Plantar fasciitis    Precancerous lesion 1987   mole excised from vulva   PVD (posterior vitreous detachment), right 09/07/2015   Past Surgical History:  Procedure Laterality Date   ABLATION  11/10/2002   Hysteroscopic Thermal   cyst on scalp     sebaccous(multiple times  as a teen)   gastro surgery  07/31/2010   LAPAROSCOPIC CHOLECYSTECTOMY  11/28/2001   left leg muscle surgery  07/31/2010   OTHER SURGICAL HISTORY     Sebaceous cyst on head removed 7x's   RETINAL DETACHMENT SURGERY      FAMILY HISTORY Family History  Problem Relation Age of Onset   Colon cancer Father 55   Alzheimer's disease Father    Emphysema Father    COPD Father    Heart attack Mother    Cancer Brother        peritod gland cancer   Breast cancer Maternal Aunt        post menopause    SOCIAL HISTORY Social History   Tobacco Use   Smoking status: Never   Smokeless tobacco: Never  Substance Use Topics   Alcohol use: Yes    Alcohol/week: 3.0  standard drinks    Types: 3 Glasses of wine per week   Drug use: No         OPHTHALMIC EXAM:  Base Eye Exam     Visual Acuity (Snellen - Linear)       Right Left   Dist cc 20/40 -2 20/40 +2   Dist ph cc 20/30 NI    Correction: Glasses         Tonometry (Tonopen, 2:18 PM)       Right Left   Pressure 14 14         Pupils       Dark Light Shape React APD   Right 4 3 Round Minimal None   Left 4 3 Round Minimal None         Visual Fields (Counting fingers)       Left Right    Full Full         Extraocular Movement       Right Left    Full, Ortho Full, Ortho         Neuro/Psych     Oriented x3: Yes   Mood/Affect: Normal         Dilation     Both eyes: 1.0% Mydriacyl, 2.5% Phenylephrine @ 2:18 PM           Slit Lamp and Fundus Exam     Slit Lamp Exam       Right Left   Lids/Lashes Dermatochalasis - upper lid Dermatochalasis - upper lid   Conjunctiva/Sclera White and quiet White and quiet   Cornea Arcus, Trace PEE Arcus, Trace PEE   Anterior Chamber Deep and quiet Deep and quiet   Iris Round and Dilated Round and Dilated   Lens 2-3+NS, 2-3+CC, 1+ PSC 2-3+NS, 2-3+CC   Vitreous Vitreous syneresis, Posterior vitreous detachment, Vitreous condensations Vitreous syneresis, Vitreous condensations, Posterior vitreous detachment         Fundus Exam       Right Left   Disc mild pallor, sharp rim, tilted, temp PPA, PPP Pink and sharp, tilted, temp PPA   C/D Ratio 0.5 0.5   Macula Flat, Blunted foveal reflex, ERM with striae greatest temporal to fovea Flat, Blunted foveal reflex, RPE mottling and clumping, mild ERM, no heme or edema   Vessels Vascular attenuation, Tortuous Vascular attenuation, Tortuous   Periphery Attached, Good cryo changes 1100, Inferior pavingstone, no RT/RD Attached, mild pavingstone degeneration inferiorly           Refraction     Wearing Rx       Sphere Cylinder Axis   Right -  6.00 +1.25 010   Left -6.00  +1.00 022         Manifest Refraction       Sphere Cylinder Axis Dist VA   Right -7.75 +1.00 010 20/40+1   Left -7.25 +1.25 020 20/40+1            IMAGING AND PROCEDURES  Imaging and Procedures for 02/23/2021  OCT, Retina - OU - Both Eyes       Right Eye Quality was good. Central Foveal Thickness: 325. Progression has no prior data. Findings include abnormal foveal contour, macular pucker, no IRF, no SRF, epiretinal membrane, myopic contour (ERM with peri-foveal thickening greatest temporal fovea, myopic contour).   Left Eye Quality was good. Central Foveal Thickness: 270. Progression has no prior data. Findings include normal foveal contour, no IRF, no SRF, epiretinal membrane, macular pucker, myopic contour (Mild ERM/pucker superior macula).   Notes *Images captured and stored on drive  Diagnosis / Impression:  ERM OU (OD>OS) Myopic contour OU OD: ERM with peri-foveal thickening greatest temporal fovea, myopic contour OS: Mild ERM/pucker superior macula  Clinical management:  See below  Abbreviations: NFP - Normal foveal profile. CME - cystoid macular edema. PED - pigment epithelial detachment. IRF - intraretinal fluid. SRF - subretinal fluid. EZ - ellipsoid zone. ERM - epiretinal membrane. ORA - outer retinal atrophy. ORT - outer retinal tubulation. SRHM - subretinal hyper-reflective material. IRHM - intraretinal hyper-reflective material            ASSESSMENT/PLAN:    ICD-10-CM   1. Epiretinal membrane (ERM) of both eyes  H35.373     2. Retinal edema  H35.81 OCT, Retina - OU - Both Eyes    3. History of retinal detachment  Z86.69     4. Essential hypertension  I10     5. Hypertensive retinopathy of both eyes  H35.033     6. Combined forms of age-related cataract of both eyes  H25.813      1,2. Epiretinal membrane, OU (OD>OS) - The natural history, anatomy, potential for loss of vision, and treatment options including vitrectomy techniques and the  complications of endophthalmitis, retinal detachment, vitreous hemorrhage, cataract progression and permanent vision loss discussed with the patient. - BCVA 20/30 OD; 20/40+ OS - symptomatic, mild metamorphopsia OU - discussed possible need for surgery OD > OS, but recommend monitoring for now - discussed benefits of undergoing cataract surgery prior to PPV for ERM - f/u 3-4 mos -- DFE/OCT   3. History of retinal detachment OD  - s/p pneumatic retinopexy OD 02.24.18 [facility] w/ Dr. Maryjean Ka  - HST at 1100 -- good cryo  - retina stably reattached, no RT/RD  4,5. Hypertensive retinopathy OU - discussed importance of tight BP control - monitor  6. Mixed Cataract OU - The symptoms of cataract, surgical   options, and treatments and risks were discussed with patient. - discussed diagnosis and progression - under the expert management of Dr. Valetta Close - clear from a retina standpoint to proceed with cataract surgery when pt and surgeon are ready   Ophthalmic Meds Ordered this visit:  No orders of the defined types were placed in this encounter.     Return for 3-4 mos - ERM f/u.  There are no Patient Instructions on file for this visit.   Explained the diagnoses, plan, and follow up with the patient and they expressed understanding.  Patient expressed understanding of the importance of proper follow up care.   Gardiner Sleeper, M.D.,  Ph.D. Diseases & Surgery of the Retina and Vitreous [facility]  I have reviewed the above documentation for accuracy and completeness, and I agree with the above. Gardiner Sleeper, M.D., Ph.D. 02/23/21 10:25 PM   Abbreviations: M myopia (nearsighted); A astigmatism; H hyperopia (farsighted); P presbyopia; Mrx spectacle prescription;  CTL contact lenses; OD right eye; OS left eye; OU both eyes  XT exotropia; ET esotropia; PEK punctate epithelial keratitis; PEE punctate epithelial erosions; DES dry eye syndrome; MGD meibomian gland  dysfunction; ATs artificial tears; PFAT's preservative free artificial tears; [place] nuclear sclerotic cataract; PSC posterior subcapsular cataract; ERM epi-retinal membrane; PVD posterior vitreous detachment; RD retinal detachment; DM diabetes mellitus; DR diabetic retinopathy; NPDR non-proliferative diabetic retinopathy; PDR proliferative diabetic retinopathy; CSME clinically significant macular edema; DME diabetic macular edema; dbh dot blot hemorrhages; CWS cotton wool spot; POAG primary open angle glaucoma; C/D cup-to-disc ratio; HVF humphrey visual field; GVF goldmann visual field; OCT optical coherence tomography; IOP intraocular pressure; BRVO Branch retinal vein occlusion; CRVO central retinal vein occlusion; CRAO central retinal artery occlusion; BRAO branch retinal artery occlusion; RT retinal tear; SB scleral buckle; PPV pars plana vitrectomy; VH Vitreous hemorrhage; PRP panretinal laser photocoagulation; IVK intravitreal kenalog; VMT vitreomacular traction; MH Macular hole;  NVD neovascularization of the disc; NVE neovascularization elsewhere; AREDS age related eye disease study; ARMD age related macular degeneration; POAG primary open angle glaucoma; EBMD epithelial/anterior basement membrane dystrophy; ACIOL anterior chamber intraocular lens; IOL intraocular lens; PCIOL posterior chamber intraocular lens; Phaco/IOL phacoemulsification with intraocular lens placement; [place] photorefractive keratectomy; LASIK laser assisted in situ keratomileusis; HTN hypertension; DM diabetes mellitus; COPD chronic obstructive pulmonary disease

## 2021-02-23 ENCOUNTER — Other Ambulatory Visit: Payer: Self-pay

## 2021-02-23 ENCOUNTER — Ambulatory Visit (INDEPENDENT_AMBULATORY_CARE_PROVIDER_SITE_OTHER): Payer: 59 | Admitting: Ophthalmology

## 2021-02-23 ENCOUNTER — Encounter (INDEPENDENT_AMBULATORY_CARE_PROVIDER_SITE_OTHER): Payer: Self-pay | Admitting: Ophthalmology

## 2021-02-23 DIAGNOSIS — Z8669 Personal history of other diseases of the nervous system and sense organs: Secondary | ICD-10-CM

## 2021-02-23 DIAGNOSIS — H3581 Retinal edema: Secondary | ICD-10-CM | POA: Diagnosis not present

## 2021-02-23 DIAGNOSIS — H35373 Puckering of macula, bilateral: Secondary | ICD-10-CM

## 2021-02-23 DIAGNOSIS — H35033 Hypertensive retinopathy, bilateral: Secondary | ICD-10-CM

## 2021-02-23 DIAGNOSIS — I10 Essential (primary) hypertension: Secondary | ICD-10-CM | POA: Diagnosis not present

## 2021-02-23 DIAGNOSIS — H25813 Combined forms of age-related cataract, bilateral: Secondary | ICD-10-CM

## 2021-03-30 ENCOUNTER — Other Ambulatory Visit: Payer: 59

## 2021-04-05 ENCOUNTER — Other Ambulatory Visit: Payer: 59

## 2021-05-30 NOTE — Progress Notes (Signed)
[facility] Note  06/01/2021     CHIEF COMPLAINT Patient presents for Retina Follow Up   HISTORY OF PRESENT ILLNESS: Erika Bolton is a 65 y.o. female who presents to the clinic today for:   HPI     Retina Follow Up   Patient presents with  Other.  In both eyes.  This started months ago.  Severity is moderate.  Duration of 3 months.  Since onset it is stable.  I, the attending physician,  performed the HPI with the patient and updated documentation appropriately.        Comments   65 y/o female pt here for 3 mo f/u for ERM OU.  VA OU improved s/p cat sx w/Dr. Valetta Close.  Denies pain, FOL, floaters.  Refresh QAM OU.      Last edited by Bernarda Caffey, MD on 06/03/2021  1:34 AM.    Pt states she had cataract sx OU with Dr. Valetta Close, she states her OD sx was more complicated than normal, she was unable to get a toric lens as planned, she had a follow up appt last week and Dr. Valetta Close said she looked stable   Referring physician: [name], MD [place], [place] [address]  HISTORICAL INFORMATION:   Selected notes from the MEDICAL RECORD NUMBER Referred by Dr. Valetta Close for ERM OU LEE: 02/15/2021 BCVA OD: 20/30 OS: 20/50 Ocular Hx- history of cryo for retinal tear OD, ERM, cataracts PMH- HTN, anxiety    CURRENT MEDICATIONS: Current Outpatient Medications (Ophthalmic Drugs)  Medication Sig   carboxymethylcellulose (REFRESH PLUS) 0.5 % SOLN Apply to eye.   No current facility-administered medications for this visit. (Ophthalmic Drugs)   Current Outpatient Medications (Other)  Medication Sig   ALPRAZolam (XANAX) 0.25 MG tablet Take 0.125-0.25 mg by mouth daily as needed.   amLODipine (NORVASC) 2.5 MG tablet Take 2.5 mg by mouth daily.   Calcium Carbonate (CALTRATE 600 PO) Take by mouth.   cholecalciferol (VITAMIN D) 1000 UNITS tablet Take 1,000 Units by mouth daily.   loratadine (CLARITIN) 10 MG tablet Take 10 mg by mouth daily.    methocarbamol (ROBAXIN) 750 MG tablet Take 750 mg by mouth at bedtime as needed.   metroNIDAZOLE (METROGEL) 0.75 % gel metronidazole 0.75 % topical gel  APPLY TO AFFECTED AREA EVERY DAY   Multiple Vitamins-Minerals (WOMENS MULTIVITAMIN PLUS PO) Take 1 tablet by mouth daily.   omeprazole (PRILOSEC) 20 MG capsule Take by mouth daily.   tretinoin (RETIN-A) 0.05 % cream tretinoin 0.05 % topical cream  APPLY USING 1 APPLICATION TO AFFECTED AREA IN THE EVENING TO FACE ONCE A DAY EXTERNALLY(FOR ACNE)   No current facility-administered medications for this visit. (Other)   REVIEW OF SYSTEMS: ROS   Positive for: Gastrointestinal, Eyes Negative for: Constitutional, Neurological, Skin, Genitourinary, Musculoskeletal, HENT, Endocrine, Cardiovascular, Respiratory, Psychiatric, Allergic/Imm, Heme/Lymph Last edited by Matthew Folks, COA on 06/01/2021  3:12 PM.     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)    Plantar fasciitis    Precancerous lesion 1987   mole excised from vulva   PVD (posterior vitreous detachment), right 09/07/2015   Past Surgical History:  Procedure Laterality Date   ABLATION  11/10/2002   Hysteroscopic Thermal   CATARACT EXTRACTION     cyst on scalp     sebaccous(multiple times as a teen)   EYE SURGERY     gastro surgery  07/31/2010   LAPAROSCOPIC CHOLECYSTECTOMY  11/28/2001   left leg muscle surgery  07/31/2010   OTHER SURGICAL HISTORY     Sebaceous cyst on head removed 7x's   RETINAL DETACHMENT SURGERY      FAMILY  HISTORY Family History  Problem Relation Age of Onset   Colon cancer Father 59   Alzheimer's disease Father    Emphysema Father    COPD Father    Heart attack Mother    Cancer Brother        peritod gland cancer   Breast cancer Maternal Aunt        post menopause   SOCIAL HISTORY Social History   Tobacco Use   Smoking status: Never   Smokeless tobacco: Never  Substance Use Topics   Alcohol use: Yes    Alcohol/week: 3.0 standard drinks    Types: 3 Glasses of wine per week   Drug use: No       OPHTHALMIC EXAM: Base Eye Exam     Visual Acuity (Snellen - Linear)       Right Left   Dist [place] 20/25 -2 20/20   Dist ph [place] NI          Tonometry (Tonopen, 3:15 PM)       Right Left   Pressure 12 13         Pupils       Dark Light Shape React APD   Right 4 3 Round Minimal None   Left 4 3 Round Minimal None         Visual Fields (Counting fingers)       Left Right    Full Full         Extraocular Movement       Right Left    Full, Ortho Full, Ortho         Neuro/Psych     Oriented x3: Yes   Mood/Affect: Normal         Dilation     Both eyes: 1.0% Mydriacyl, 2.5% Phenylephrine @ 3:15 PM           Slit Lamp and Fundus Exam     Slit Lamp Exam       Right Left   Lids/Lashes Dermatochalasis - upper lid Dermatochalasis - upper lid   Conjunctiva/Sclera White and quiet White and quiet   Cornea Arcus, well healed temporal cataract wound Arcus, well healed cataract wound   Anterior Chamber Deep and quiet Deep and quiet   Iris Round and Dilated Round and Dilated   Lens PC IOL in good position, trace Posterior capsular opacification; no pseudophakodonesis PC IOL in good position, trace Posterior capsular opacification   Vitreous Vitreous syneresis, trace fine pigment, Posterior vitreous detachment, Vitreous condensations Vitreous syneresis, Vitreous condensations, Posterior vitreous detachment         Fundus Exam       Right Left   Disc  mild pallor, sharp rim, tilted, temp PPA, PPP Pink and sharp, tilted, temp PPA   C/D Ratio 0.5  0.5   Macula Flat, Blunted foveal reflex, ERM with striae greatest temporal to fovea Flat, Blunted foveal reflex, RPE mottling and clumping, mild ERM, no heme or edema   Vessels Vascular attenuation, Tortuous Vascular attenuation, Tortuous   Periphery Attached, Good cryo changes 1100, Inferior pavingstone, no new RT/RD Attached, pigmented pavingstone degeneration inferiorly, No RT/RD           IMAGING AND PROCEDURES  Imaging and Procedures for 06/01/2021  OCT, Retina - OU - Both Eyes       Right Eye Quality was good. Central Foveal Thickness: 325. Progression has been stable. Findings include abnormal foveal contour, macular pucker, no IRF, no SRF, epiretinal membrane, myopic contour (ERM with peri-foveal thickening greatest temporal fovea, myopic contour).   Left Eye Quality was good. Central Foveal Thickness: 277. Progression has been stable. Findings include normal foveal contour, no IRF, no SRF, epiretinal membrane, macular pucker, myopic contour (Mild ERM/pucker superior macula).   Notes *Images captured and stored on drive  Diagnosis / Impression:  ERM OU (OD>OS) Myopic contour OU OD: ERM with peri-foveal thickening greatest temporal fovea, myopic contour -- stable from prior OS: Mild ERM/pucker superior macula  Clinical management:  See below  Abbreviations: NFP - Normal foveal profile. CME - cystoid macular edema. PED - pigment epithelial detachment. IRF - intraretinal fluid. SRF - subretinal fluid. EZ - ellipsoid zone. ERM - epiretinal membrane. ORA - outer retinal atrophy. ORT - outer retinal tubulation. SRHM - subretinal hyper-reflective material. IRHM - intraretinal hyper-reflective material            ASSESSMENT/PLAN:    ICD-10-CM   1. Epiretinal membrane (ERM) of both eyes  H35.373     2. Retinal edema  H35.81 OCT, Retina - OU - Both Eyes    3. History of  retinal detachment  Z86.69     4. Essential hypertension  I10     5. Hypertensive retinopathy of both eyes  H35.033     6. Combined forms of age-related cataract of both eyes  H25.813      1,2. Epiretinal membrane, OU (OD>OS) - BCVA 20/25 OD; 20/20 OS -- improved post cataract sx - mild metamorphopsia improved OD - discussed possible need for surgery OD > OS, but recommend monitoring for now - f/u 4-6 mos, sooner prn-- DFE/OCT   3. History of retinal detachment OD  - s/p pneumatic retinopexy OD 02.24.18 at [facility] w/ Dr. Maryjean Ka  - HST at 1100 -- good cryo  - retina stably reattached, no RT/RD  4,5. Hypertensive retinopathy OU - discussed importance of tight BP control - monitor  6. Pseudophakia OU  - s/p CE/IOL OU (Dr. Valetta Close - mid-September '22)  - OD with CTR -- no pseudophakodonesis  - IOLs in good position, doing well  - monitor  Ophthalmic Meds Ordered this visit:  No orders of the defined types were placed in this encounter.    Return for f/u 4-6 months ERM OU, DFE, OCT.  There are no Patient Instructions on file for this visit.   Explained the diagnoses, plan, and follow up with the patient and they expressed understanding.  Patient expressed understanding of the importance of proper follow up care.   This document serves as a record of services personally performed by Gardiner Sleeper, MD, PhD. It was created on their behalf by Orvan Falconer, an ophthalmic technician. The creation of this record is the provider's dictation and/or activities during the visit.    Electronically signed by: Orvan Falconer,  OA, 06/03/21  1:39 AM  This document serves as a record of services personally performed by Gardiner Sleeper, MD, PhD. It was created on their behalf by San Jetty. Owens Shark, OA an ophthalmic technician. The creation of this record is the provider's dictation and/or activities during the visit.    Electronically signed by: San Jetty. Owens Shark, [place] 11.02.2022 1:39  AM  Gardiner Sleeper, M.D., Ph.D. Diseases & Surgery of the Retina and Vitreous [facility]  I have reviewed the above documentation for accuracy and completeness, and I agree with the above. Gardiner Sleeper, M.D., Ph.D. 06/03/21 1:39 AM  Abbreviations: M myopia (nearsighted); A astigmatism; H hyperopia (farsighted); P presbyopia; Mrx spectacle prescription;  CTL contact lenses; OD right eye; OS left eye; OU both eyes  XT exotropia; ET esotropia; PEK punctate epithelial keratitis; PEE punctate epithelial erosions; DES dry eye syndrome; MGD meibomian gland dysfunction; ATs artificial tears; PFAT's preservative free artificial tears; [place] nuclear sclerotic cataract; PSC posterior subcapsular cataract; ERM epi-retinal membrane; PVD posterior vitreous detachment; RD retinal detachment; DM diabetes mellitus; DR diabetic retinopathy; NPDR non-proliferative diabetic retinopathy; PDR proliferative diabetic retinopathy; CSME clinically significant macular edema; DME diabetic macular edema; dbh dot blot hemorrhages; CWS cotton wool spot; POAG primary open angle glaucoma; C/D cup-to-disc ratio; HVF humphrey visual field; GVF goldmann visual field; OCT optical coherence tomography; IOP intraocular pressure; BRVO Branch retinal vein occlusion; CRVO central retinal vein occlusion; CRAO central retinal artery occlusion; BRAO branch retinal artery occlusion; RT retinal tear; SB scleral buckle; PPV pars plana vitrectomy; VH Vitreous hemorrhage; PRP panretinal laser photocoagulation; IVK intravitreal kenalog; VMT vitreomacular traction; MH Macular hole;  NVD neovascularization of the disc; NVE neovascularization elsewhere; AREDS age related eye disease study; ARMD age related macular degeneration; POAG primary open angle glaucoma; EBMD epithelial/anterior basement membrane dystrophy; ACIOL anterior chamber intraocular lens; IOL intraocular lens; PCIOL posterior chamber intraocular lens; Phaco/IOL  phacoemulsification with intraocular lens placement; [place] photorefractive keratectomy; LASIK laser assisted in situ keratomileusis; HTN hypertension; DM diabetes mellitus; COPD chronic obstructive pulmonary disease

## 2021-06-01 ENCOUNTER — Other Ambulatory Visit: Payer: Self-pay

## 2021-06-01 ENCOUNTER — Ambulatory Visit (INDEPENDENT_AMBULATORY_CARE_PROVIDER_SITE_OTHER): Payer: 59 | Admitting: Ophthalmology

## 2021-06-01 ENCOUNTER — Encounter (INDEPENDENT_AMBULATORY_CARE_PROVIDER_SITE_OTHER): Payer: Self-pay | Admitting: Ophthalmology

## 2021-06-01 DIAGNOSIS — H3581 Retinal edema: Secondary | ICD-10-CM

## 2021-06-01 DIAGNOSIS — H35373 Puckering of macula, bilateral: Secondary | ICD-10-CM | POA: Diagnosis not present

## 2021-06-01 DIAGNOSIS — H35033 Hypertensive retinopathy, bilateral: Secondary | ICD-10-CM

## 2021-06-01 DIAGNOSIS — I10 Essential (primary) hypertension: Secondary | ICD-10-CM | POA: Diagnosis not present

## 2021-06-01 DIAGNOSIS — Z8669 Personal history of other diseases of the nervous system and sense organs: Secondary | ICD-10-CM

## 2021-06-01 DIAGNOSIS — H25813 Combined forms of age-related cataract, bilateral: Secondary | ICD-10-CM

## 2021-06-03 ENCOUNTER — Encounter (INDEPENDENT_AMBULATORY_CARE_PROVIDER_SITE_OTHER): Payer: Self-pay | Admitting: Ophthalmology

## 2021-06-27 ENCOUNTER — Other Ambulatory Visit: Payer: Self-pay | Admitting: Physician Assistant

## 2021-06-27 ENCOUNTER — Ambulatory Visit
Admission: RE | Admit: 2021-06-27 | Discharge: 2021-06-27 | Disposition: A | Discharge status: Home / Self Care | Payer: 59 | Source: Ambulatory Visit | Attending: Physician Assistant | Admitting: Physician Assistant

## 2021-06-27 DIAGNOSIS — M25562 Pain in left knee: Secondary | ICD-10-CM

## 2021-08-25 ENCOUNTER — Encounter: Payer: Self-pay | Admitting: Obstetrics and Gynecology

## 2021-09-14 ENCOUNTER — Other Ambulatory Visit: Payer: Self-pay | Admitting: Internal Medicine

## 2021-09-14 DIAGNOSIS — M545 Low back pain, unspecified: Secondary | ICD-10-CM

## 2021-09-14 DIAGNOSIS — R29898 Other symptoms and signs involving the musculoskeletal system: Secondary | ICD-10-CM

## 2021-09-14 DIAGNOSIS — R200 Anesthesia of skin: Secondary | ICD-10-CM

## 2021-09-19 ENCOUNTER — Encounter: Payer: Self-pay | Admitting: Obstetrics and Gynecology

## 2021-09-27 ENCOUNTER — Ambulatory Visit
Admission: RE | Admit: 2021-09-27 | Discharge: 2021-09-27 | Disposition: A | Discharge status: Home / Self Care | Payer: 59 | Source: Ambulatory Visit | Attending: Internal Medicine | Admitting: Internal Medicine

## 2021-09-27 ENCOUNTER — Other Ambulatory Visit: Payer: Self-pay

## 2021-09-27 DIAGNOSIS — R29898 Other symptoms and signs involving the musculoskeletal system: Secondary | ICD-10-CM

## 2021-09-27 DIAGNOSIS — M545 Low back pain, unspecified: Secondary | ICD-10-CM

## 2021-09-27 DIAGNOSIS — R200 Anesthesia of skin: Secondary | ICD-10-CM

## 2021-10-03 ENCOUNTER — Encounter: Payer: Self-pay | Admitting: Neurology

## 2021-10-03 ENCOUNTER — Other Ambulatory Visit: Payer: Self-pay

## 2021-10-03 ENCOUNTER — Ambulatory Visit: Payer: 59 | Admitting: Neurology

## 2021-10-03 ENCOUNTER — Other Ambulatory Visit: Payer: Self-pay | Admitting: Internal Medicine

## 2021-10-03 VITALS — BP 155/82 | HR 95 | Ht 64.25 in | Wt 174.5 lb

## 2021-10-03 DIAGNOSIS — R200 Anesthesia of skin: Secondary | ICD-10-CM | POA: Diagnosis not present

## 2021-10-03 DIAGNOSIS — R292 Abnormal reflex: Secondary | ICD-10-CM | POA: Diagnosis not present

## 2021-10-03 DIAGNOSIS — M546 Pain in thoracic spine: Secondary | ICD-10-CM

## 2021-10-03 DIAGNOSIS — R269 Unspecified abnormalities of gait and mobility: Secondary | ICD-10-CM

## 2021-10-03 DIAGNOSIS — R9082 White matter disease, unspecified: Secondary | ICD-10-CM

## 2021-10-03 DIAGNOSIS — R29898 Other symptoms and signs involving the musculoskeletal system: Secondary | ICD-10-CM

## 2021-10-03 NOTE — Progress Notes (Signed)
[facility]  PATIENT: Erika Bolton DOB: 1956-04-17  REFERRING DOCTOR OR PCP: Lavone Orn, MD; Thayer Ohm, PA-C SOURCE: patient, notes from primary care, imaging and lab reports, MRI images personally reviewed.  _________________________________   HISTORICAL  CHIEF COMPLAINT:  Chief Complaint  Patient presents with   New Patient (Initial Visit)    Rm 2, alone. Here for leg numbness and weakness. Pt reports nu,bness started in leg which has now improved. Week later back was numb and periodically numbness in L hand and arm to R arm and side of face.     HISTORY OF PRESENT ILLNESS:  I had the pleasure seeing your  patient, Erika Bolton, at [facility] for neurologic consultation regarding her numbness.  She is a 66 year old woman with numbness.    She had just joined a gym early February abd started to work with a Physiological scientist.   She recalls a strenuous workout in fzebruary.   She was more sore, as expected for the next couple days.   She then noted numbness in her legs followed by weakness.    She saw her PCP and an MRI lumbar spine was ordered.   Before the [facility] 09/27/2021, she actually started to feel better but then noted that when on her feet for a while, the symptoms would worsen.    She woke up one day about 2 weeks ago with more extreme numbness in the lower thoracic region.  This improved over the next couple days.   About a week ago, she began to note numbness in her 4th and 5th fingers of both hands.    This is better the last couple days.   Yesterday, she had numbness in her legs with walking about a mile.   This occurred again today.   Symptoms are fairly symmetric.     Symptoms in legs are generally after she walks but the arm symptoms started while laying down.      She also noted the right face had intermittent numbness several times over the last few days.  Her gait is fine and she notes no balance issues.   She denies neck  pain but has stiffness in her neck and across her lower back.   She denies bladder changes.       I spoke to [facility] [place] last week and reveiewed her case and recommended imaging of the cervical and thoracic spine due to a concern of myelopathy  In 2008, she saw Dr. Erling Cruz when she was having some numbness (different than current numbness) at that time an MRI of the brain showed some T2/FLAIR hyperintense foci but they were more consistent with mild chronic microvascular ischemic change than an other etiology.  She also has obstructive sleep apnea treated with an oral appliance   Images reviewed:  MRI lumbar spine 09/27/2021.  Minimal DDD at L4-L5 mild facet hypertrophy at L4-L5 and L5-S1.  There is no spinal stenosis or nerve root compression.  MRI brain 05/12/2007 showed some small T2/FLAIR hyperintense foci in hemispheres most c/w chronic microvascular ischemic change.    REVIEW OF SYSTEMS: Constitutional: No fevers, chills, sweats, or change in appetite Eyes: No visual changes, double vision, eye pain Ear, nose and throat: No hearing loss, ear pain, nasal congestion, sore throat Cardiovascular: No chest pain, palpitations Respiratory:  No shortness of breath at rest or with exertion.   No wheezes GastrointestinaI: No nausea, vomiting, diarrhea, abdominal pain, fecal incontinence Genitourinary:  No  dysuria, urinary retention or frequency.  No nocturia. Musculoskeletal:  No neck pain, back pain Integumentary: No rash, pruritus, skin lesions Neurological: as above Psychiatric: No depression at this time.  No anxiety Endocrine: No palpitations, diaphoresis, change in appetite, change in weigh or increased thirst Hematologic/Lymphatic:  No anemia, purpura, petechiae. Allergic/Immunologic: No itchy/runny eyes, nasal congestion, recent allergic reactions, rashes  ALLERGIES: Allergies  Allergen Reactions   Other Other (See Comments)    Potassium dichromate, broke out in dry rashes    Penicillins Swelling    Throat swelling   Gold Sodium Thiosulfate Rash   Nickel Rash    HOME MEDICATIONS:  Current Outpatient Medications:    ALPRAZolam (XANAX) 0.25 MG tablet, Take 0.125-0.25 mg by mouth daily as needed., Disp: , Rfl:    amLODipine (NORVASC) 5 MG tablet, Take 5 mg by mouth daily., Disp: , Rfl:    Calcium Carbonate (CALTRATE 600 PO), Take by mouth., Disp: , Rfl:    carboxymethylcellulose (REFRESH PLUS) 0.5 % SOLN, Apply to eye., Disp: , Rfl:    cholecalciferol (VITAMIN D) 1000 UNITS tablet, Take 1,000 Units by mouth daily., Disp: , Rfl:    cholecalciferol (VITAMIN D3) 25 MCG (1000 UNIT) tablet, Take 1 tablet by mouth daily., Disp: , Rfl:    loratadine (CLARITIN) 10 MG tablet, Take 10 mg by mouth daily., Disp: , Rfl:    Magnesium Citrate 125 MG CAPS, 150 mg See admin instructions., Disp: , Rfl:    metroNIDAZOLE (METROGEL) 0.75 % gel, metronidazole 0.75 % topical gel  APPLY TO AFFECTED AREA EVERY DAY, Disp: , Rfl:    Multiple Vitamins-Minerals (WOMENS MULTIVITAMIN PLUS PO), Take 1 tablet by mouth daily., Disp: , Rfl:    Omega 3 1200 MG CAPS, Take 2 capsules by mouth daily., Disp: , Rfl:    omeprazole (PRILOSEC) 20 MG capsule, Take by mouth daily., Disp: , Rfl: 3   tretinoin (RETIN-A) 0.025 % cream, SMARTSIG:sparingly Topical Every Evening, Disp: , Rfl:   PAST MEDICAL HISTORY: Past Medical History:  Diagnosis Date   Colon polyps 2003   Detached retina    Hypertension    Hypertensive retinopathy    Kidney stone 2016   Left bundle branch block 2012   Menorrhagia    MGUS (monoclonal gammopathy of unknown significance)    Plantar fasciitis    Precancerous lesion 1987   mole excised from vulva   PVD (posterior vitreous detachment), right 09/07/2015    PAST SURGICAL HISTORY: Past Surgical History:  Procedure Laterality Date   ABLATION  11/10/2002   Hysteroscopic Thermal   CATARACT EXTRACTION     cyst on scalp     sebaccous(multiple times as a teen)   EYE  SURGERY     gastro surgery  07/31/2010   LAPAROSCOPIC CHOLECYSTECTOMY  11/28/2001   left leg muscle surgery  07/31/2010   OTHER SURGICAL HISTORY     Sebaceous cyst on head removed 7x's   RETINAL DETACHMENT SURGERY      FAMILY HISTORY: Family History  Problem Relation Age of Onset   Colon cancer Father 65   Alzheimer's disease Father    Emphysema Father    COPD Father    Heart attack Mother    Cancer Brother        peritod gland cancer   Breast cancer Maternal Aunt        post menopause    SOCIAL HISTORY:  Social History   Socioeconomic History   Marital status: Married    Spouse name: Not  on file   Number of children: 2   Years of education: M.ED.   Highest education level: Not on file  Occupational History    Comment: [place]  Tobacco Use   Smoking status: Never   Smokeless tobacco: Never  Substance and Sexual Activity   Alcohol use: Yes    Alcohol/week: 3.0 standard drinks    Types: 3 Glasses of wine per week   Drug use: No   Sexual activity: Not Currently    Partners: Male    Birth control/protection: Post-menopausal  Other Topics Concern   Not on file  Social History Narrative   2 cups of coffee a day    Social Determinants of Health   Financial Resource Strain: Not on file  Food Insecurity: Not on file  Transportation Needs: Not on file  Physical Activity: Not on file  Stress: Not on file  Social Connections: Not on file  Intimate Partner Violence: Not on file     PHYSICAL EXAM  Vitals:   10/03/21 1514  BP: (!) 155/82  Pulse: 95  Weight: 174 lb 8 oz (79.2 kg)  Height: 5' 4.25" (1.632 m)    Body mass index is 29.72 kg/m.   General: The patient is well-developed and well-nourished and in no acute distress  HEENT:  Head is [place]/AT.  Sclera are anicteric.    Neck: No carotid bruits are noted.  The neck is nontender.  Cardiovascular: The heart has a regular rate and rhythm with a normal S1 and S2. There were no murmurs, gallops  or rubs.    Skin: Extremities are without rash or  edema.  Musculoskeletal:  Back is nontender  Neurologic Exam  Mental status: The patient is alert and oriented x 3 at the time of the examination. The patient has apparent normal recent and remote memory, with an apparently normal attention span and concentration ability.   Speech is normal.  Cranial nerves: Extraocular movements are full. Pupils are equal, round, and reactive to light and accomodation.  Color vision is normal..  Facial symmetry is present. There is good facial sensation to soft touch bilaterally.Facial strength is normal.  Trapezius and sternocleidomastoid strength is normal. No dysarthria is noted.   Normal hearing  Motor:  Muscle bulk is normal.   Tone is normal. Strength is  5 / 5 in all 4 extremities.   Sensory: Sensory testing is intact to pinprick, soft touch and vibration sensation in all 4 extremities.  Coordination: Cerebellar testing reveals good finger-nose-finger and heel-to-shin bilaterally.  Gait and station: Station is normal.   Gait is normal. Tandem gait is mildly wide. Romberg is negative.   Reflexes: Deep tendon reflexes are symmetric and normal bilaterally in arms.   She has increased DTRs in legs and clonus at right ankle.  Reflexes were 3 but more symmetric at the knees.  Plantar responses are flexor.    DIAGNOSTIC DATA (LABS, IMAGING, TESTING) - I reviewed patient records, labs, notes, testing and imaging myself where available.  Lab Results  Component Value Date   WBC 6.6 01/14/2021   HGB 14.0 01/14/2021   HCT 40.7 01/14/2021   MCV 87.3 01/14/2021   PLT 240 01/14/2021      Component Value Date/Time   NA 138 01/14/2021 1512   NA 140 01/09/2017 1523   K 4.0 01/14/2021 1512   K 4.1 01/09/2017 1523   CL 109 01/14/2021 1512   CL 109 (H) 01/10/2013 1446   CO2 23 01/14/2021 1512  CO2 23 01/09/2017 1523   GLUCOSE 101 (H) 01/14/2021 1512   GLUCOSE 94 01/09/2017 1523   GLUCOSE 116 (H)  01/10/2013 1446   BUN 16 01/14/2021 1512   BUN 14.1 01/09/2017 1523   CREATININE 0.59 01/14/2021 1512   CREATININE 0.7 01/09/2017 1523   CALCIUM 10.2 01/14/2021 1512   CALCIUM 10.5 (H) 01/09/2017 1523   PROT 7.3 01/14/2021 1512   PROT 7.1 01/09/2017 1523   PROT 7.5 01/09/2017 1523   ALBUMIN 4.2 01/14/2021 1512   ALBUMIN 4.0 01/09/2017 1523   AST 39 01/14/2021 1512   AST 27 01/09/2017 1523   ALT 47 (H) 01/14/2021 1512   ALT 31 01/09/2017 1523   ALKPHOS 74 01/14/2021 1512   ALKPHOS 94 01/09/2017 1523   BILITOT 0.4 01/14/2021 1512   BILITOT 0.45 01/09/2017 1523   GFRNONAA >60 01/14/2021 1512   GFRAA >60 01/09/2020 1115        ASSESSMENT AND PLAN  Hyperreflexia  Numbness  Gait disturbance  White matter abnormality on MRI of brain  In summary, Ms. Winkel is a 66 year old woman who has had sensory symptoms mostly in the legs since a strenuous workout 1 month ago.  She also has had some episodes of numbness in the hands, specifically the fourth and fifth fingers and in the right face.  Her exam showed a wide tandem gait and hyperreflexia at the right ankle with clonus.  The combination of bilateral sensory symptoms and abnormal reflexes is most concerning for a spinal cord process.  She will be having an MRI of the cervical and thoracic spine in 2 days.  I discussed with her that if it does show a herniated disc or other cause of extrinsic myelopathy we would need to refer her to neurosurgery.  If an intrinsic myelopathy is noted such as transverse myelitis additional blood work may be necessary.  I will see her back depending on the findings of the MRI as well as changes in symptoms over time.  Thank you for asking me to see Ms. Weaver.  Please let me know if I can be of further assistance with her or other patients in the future.   Ayen Viviano A. Felecia Shelling, MD, [facility] 01/01/159, 1:09 PM Certified in Neurology, Clinical Neurophysiology, Sleep Medicine and Neuroimaging  [facility] [address], [place], [place] [address] [phone]

## 2021-10-05 ENCOUNTER — Ambulatory Visit
Admission: RE | Admit: 2021-10-05 | Discharge: 2021-10-05 | Disposition: A | Discharge status: Home / Self Care | Payer: 59 | Source: Ambulatory Visit | Attending: Internal Medicine | Admitting: Internal Medicine

## 2021-10-05 DIAGNOSIS — R29898 Other symptoms and signs involving the musculoskeletal system: Secondary | ICD-10-CM

## 2021-10-05 DIAGNOSIS — R200 Anesthesia of skin: Secondary | ICD-10-CM

## 2021-10-05 DIAGNOSIS — M546 Pain in thoracic spine: Secondary | ICD-10-CM

## 2021-10-06 ENCOUNTER — Telehealth: Payer: Self-pay | Admitting: Neurology

## 2021-10-06 DIAGNOSIS — R200 Anesthesia of skin: Secondary | ICD-10-CM

## 2021-10-06 DIAGNOSIS — R292 Abnormal reflex: Secondary | ICD-10-CM

## 2021-10-06 DIAGNOSIS — M4802 Spinal stenosis, cervical region: Secondary | ICD-10-CM | POA: Insufficient documentation

## 2021-10-06 NOTE — Telephone Encounter (Signed)
See other note from today

## 2021-10-06 NOTE — Telephone Encounter (Signed)
I spoke to Erika Bolton about the MRI results.  She has moderately severe spinal stenosis (AP diameter 6.3 mm) at C5-C6 and mild spinal stenosis at C6-C7.  There is no myelopathic scar. ? ?I discussed with her that I believe the episode of leg weakness/numbness and the hyperreflexia with sore on examination could be related to this finding.  Symptoms have greatly improved but I would like her to see neurosurgery for an additional opinion as to whether surgery/decompression at C5-C6 (and possibly C6-C7) would be beneficial. ? ?She gets some intermittent numbness in the hands in the fourth and fifth fingers but has no hand weakness.  This would more likely be due to a mild ulnar neuropathy but is not medically urgent and if it worsens we can check NCV/EMG ?

## 2021-10-06 NOTE — Addendum Note (Signed)
Addended by: Arlice Colt A on: 10/06/2021 10:30 AM ? ? Modules accepted: Orders ? ?

## 2021-10-06 NOTE — Telephone Encounter (Signed)
Sent to [facility] ph # [phone]. 

## 2021-10-06 NOTE — Telephone Encounter (Signed)
Pt notifying Dr. Felecia Shelling results for Cervical Spine MRI and Thoracic Spine MRI are in MyChart ?

## 2021-10-06 NOTE — Telephone Encounter (Signed)
Noted, Dr Felecia Shelling will review. ?

## 2021-11-07 NOTE — Progress Notes (Signed)
?[facility] Note ? ?11/09/2021 ? ?  ? ?CHIEF COMPLAINT ?Patient presents for Retina Follow Up ? ? ? ?HISTORY OF PRESENT ILLNESS: ?Erika Bolton is a 66 y.o. female who presents to the clinic today for:  ? ?HPI   ? ? Retina Follow Up   ?Patient presents with  Other.  In both eyes.  This started 5 months ago.  I, the attending physician,  performed the HPI with the patient and updated documentation appropriately. ? ?  ?  ? ? Comments   ?Patient here for 5 months retina follow up for ERM OU. Patient states vision is fine. No eye pain. wears CL OD to read.  ? ?  ?  ?Last edited by Bernarda Caffey, MD on 11/12/2021  2:47 AM.  ?  ?Patient states she noticed floaters the other day.  It cleared up and hasn't returned.  ? ?Referring physician: ?[name], MD ?[address] ?[place], [place] [address] ? ?HISTORICAL INFORMATION:  ? ?Selected notes from the [facility] ?Referred by Dr. Valetta Close for ERM OU ?LEE: 02/15/2021 BCVA OD: 20/30 OS: 20/50 ?Ocular Hx- history of cryo for retinal tear OD, ERM, cataracts ?PMH- HTN, anxiety ?  ? ?CURRENT MEDICATIONS: ?Current Outpatient Medications (Ophthalmic Drugs)  ?Medication Sig  ? carboxymethylcellulose (REFRESH PLUS) 0.5 % SOLN Apply to eye.  ? ?No current facility-administered medications for this visit. (Ophthalmic Drugs)  ? ?Current Outpatient Medications (Other)  ?Medication Sig  ? amLODipine (NORVASC) 5 MG tablet Take 5 mg by mouth daily.  ? Calcium Carbonate (CALTRATE 600 PO) Take by mouth.  ? cholecalciferol (VITAMIN D) 1000 UNITS tablet Take 1,000 Units by mouth daily.  ? cholecalciferol (VITAMIN D3) 25 MCG (1000 UNIT) tablet Take 1 tablet by mouth daily.  ? loratadine (CLARITIN) 10 MG tablet Take 10 mg by mouth daily.  ? Magnesium Citrate 125 MG CAPS 150 mg See admin instructions.  ? metroNIDAZOLE (METROGEL) 0.75 % gel metronidazole 0.75 % topical gel ? APPLY TO AFFECTED AREA EVERY DAY  ? Multiple Vitamins-Minerals (WOMENS MULTIVITAMIN  PLUS PO) Take 1 tablet by mouth daily.  ? Omega 3 1200 MG CAPS Take 2 capsules by mouth daily.  ? omeprazole (PRILOSEC) 20 MG capsule Take by mouth daily.  ? tretinoin (RETIN-A) 0.025 % cream SMARTSIG:sparingly Topical Every Evening  ? ALPRAZolam (XANAX) 0.25 MG tablet Take 0.125-0.25 mg by mouth daily as needed. (Patient not taking: Reported on 11/09/2021)  ? ?No current facility-administered medications for this visit. (Other)  ? ?REVIEW OF SYSTEMS: ?ROS   ?Positive for: Gastrointestinal, Eyes ?Negative for: Constitutional, Neurological, Skin, Genitourinary, Musculoskeletal, HENT, Endocrine, Cardiovascular, Respiratory, Psychiatric, Allergic/Imm, Heme/Lymph ?Last edited by Theodore Demark, COA on 11/09/2021  2:55 PM.  ?  ? ? ?ALLERGIES ?Allergies  ?Allergen Reactions  ? Other Other (See Comments)  ?  Potassium dichromate, broke out in dry rashes  ? Penicillins Swelling  ?  Throat swelling  ? Gold Sodium Thiosulfate Rash  ? Nickel Rash  ? ?PAST MEDICAL HISTORY ?Past Medical History:  ?Diagnosis Date  ? Colon polyps 2003  ? Detached retina   ? Hypertension   ? Hypertensive retinopathy   ? Kidney stone 2016  ? Left bundle branch block 2012  ? Menorrhagia   ? MGUS (monoclonal gammopathy of unknown significance)   ? Plantar fasciitis   ? Precancerous lesion 1987  ? mole excised from vulva  ? PVD (posterior vitreous detachment), right 09/07/2015  ? ?Past Surgical History:  ?  Procedure Laterality Date  ? ABLATION  11/10/2002  ? Hysteroscopic Thermal  ? CATARACT EXTRACTION    ? cyst on scalp    ? sebaccous(multiple times as a teen)  ? EYE SURGERY    ? gastro surgery  07/31/2010  ? LAPAROSCOPIC CHOLECYSTECTOMY  11/28/2001  ? left leg muscle surgery  07/31/2010  ? OTHER SURGICAL HISTORY    ? Sebaceous cyst on head removed 7x's  ? RETINAL DETACHMENT SURGERY    ? ? ?FAMILY HISTORY ?Family History  ?Problem Relation Age of Onset  ? Colon cancer Father 19  ? Alzheimer's disease Father   ? Emphysema Father   ? COPD Father   ?  Heart attack Mother   ? Cancer Brother   ?     peritod gland cancer  ? Breast cancer Maternal Aunt   ?     post menopause  ? ?SOCIAL HISTORY ?Social History  ? ?Tobacco Use  ? Smoking status: Never  ? Smokeless tobacco: Never  ?Vaping Use  ? Vaping Use: Never used  ?Substance Use Topics  ? Alcohol use: Yes  ?  Alcohol/week: 3.0 standard drinks  ?  Types: 3 Glasses of wine per week  ? Drug use: No  ?  ? ?  ?OPHTHALMIC EXAM: ?Base Eye Exam   ? ? Visual Acuity (Snellen - Linear)   ? ?   Right Left  ? Dist [place] 20/25 -1 20/20 -1  ? Dist ph [place] 20/20 -2   ? ?  ?  ? ? Tonometry (Tonopen, 2:52 PM)   ? ?   Right Left  ? Pressure 13 12  ? ?  ?  ? ? Pupils   ? ?   Dark Light Shape React APD  ? Right 4 3 Round Minimal None  ? Left 4 3 Round Minimal None  ? ?  ?  ? ? Visual Fields (Counting fingers)   ? ?   Left Right  ?  Full Full  ? ?  ?  ? ? Extraocular Movement   ? ?   Right Left  ?  Full, Ortho Full, Ortho  ? ?  ?  ? ? Neuro/Psych   ? ? Oriented x3: Yes  ? Mood/Affect: Normal  ? ?  ?  ? ? Dilation   ? ? Both eyes: 1.0% Mydriacyl, 2.5% Phenylephrine @ 2:52 PM  ? ?  ?  ? ?  ? ?Slit Lamp and Fundus Exam   ? ? Slit Lamp Exam   ? ?   Right Left  ? Lids/Lashes Dermatochalasis - upper lid Dermatochalasis - upper lid  ? Conjunctiva/Sclera White and quiet White and quiet  ? Cornea Arcus, well healed temporal cataract wound, Mild debris in tear film Arcus, well healed cataract wound  ? Anterior Chamber Deep and quiet Deep and quiet  ? Iris Round and Dilated Round and Dilated  ? Lens PC IOL in good position, trace Posterior capsular opacification; no pseudophakodonesis PC IOL in good position, 1+ Posterior capsular opacification  ? Anterior Vitreous Vitreous syneresis, trace fine pigment, Posterior vitreous detachment, Vitreous condensations Vitreous syneresis, Vitreous condensations, Posterior vitreous detachment  ? ?  ?  ? ? Fundus Exam   ? ?   Right Left  ? Disc mild pallor, sharp rim, tilted, temp PPA, PPP Pink and sharp, tilted,  temp PPA  ? C/D Ratio 0.5 0.5  ? Macula Flat, Blunted foveal reflex, ERM with striae greatest temporal to fovea Flat, Good foveal reflex,  RPE mottling and clumping, mild ERM, no heme or edema  ? Vessels Vascular attenuation, Tortuous Vascular attenuation, Tortuous  ? Periphery Attached, Good cryo changes 1100, Inferior pavingstone, no new RT/RD Attached, pigmented pavingstone degeneration inferiorly, No RT/RD  ? ?  ?  ? ?  ? ?Refraction   ? ? Wearing Rx   ? ?   Sphere Cylinder Axis  ? Right -6.00 +1.25 010  ? Left -6.00 +1.00 022  ? ?  ?  ? ?  ? ?IMAGING AND PROCEDURES  ?Imaging and Procedures for 11/09/2021 ? ?OCT, Retina - OU - Both Eyes   ? ?   ?Right Eye ?Quality was good. Central Foveal Thickness: 311. Progression has improved. Findings include abnormal foveal contour, macular pucker, no IRF, no SRF, epiretinal membrane, myopic contour (ERM with peri-foveal thickening greatest temporal fovea, myopic contour, mild interval improvement in central foveal contour).  ? ?Left Eye ?Quality was good. Central Foveal Thickness: 271. Progression has been stable. Findings include normal foveal contour, no IRF, no SRF, epiretinal membrane, macular pucker, myopic contour (Mild ERM/pucker superior macula).  ? ?Notes ?*Images captured and stored on drive ? ?Diagnosis / Impression:  ?ERM OU (OD>OS) ?Myopic contour OU ?OD: ERM with peri-foveal thickening greatest temporal fovea, myopic contour, mild interval improvement in central foveal contour ?OS: Mild ERM/pucker superior macula ? ?Clinical management:  ?See below ? ?Abbreviations: NFP - Normal foveal profile. CME - cystoid macular edema. PED - pigment epithelial detachment. IRF - intraretinal fluid. SRF - subretinal fluid. EZ - ellipsoid zone. ERM - epiretinal membrane. ORA - outer retinal atrophy. ORT - outer retinal tubulation. SRHM - subretinal hyper-reflective material. IRHM - intraretinal hyper-reflective material  ? ?  ? ?  ?  ? ?  ?ASSESSMENT/PLAN: ? ?  ICD-10-CM   ?1.  Epiretinal membrane (ERM) of both eyes  H35.373 OCT, Retina - OU - Both Eyes  ?  ?2. History of retinal detachment  Z86.69   ?  ?3. Essential hypertension  I10   ?  ?4. Hypertensive retinopathy of both eyes

## 2021-11-09 ENCOUNTER — Encounter (INDEPENDENT_AMBULATORY_CARE_PROVIDER_SITE_OTHER): Payer: Self-pay | Admitting: Ophthalmology

## 2021-11-09 ENCOUNTER — Ambulatory Visit (INDEPENDENT_AMBULATORY_CARE_PROVIDER_SITE_OTHER): Payer: 59 | Admitting: Ophthalmology

## 2021-11-09 DIAGNOSIS — I10 Essential (primary) hypertension: Secondary | ICD-10-CM

## 2021-11-09 DIAGNOSIS — H35033 Hypertensive retinopathy, bilateral: Secondary | ICD-10-CM | POA: Diagnosis not present

## 2021-11-09 DIAGNOSIS — H35373 Puckering of macula, bilateral: Secondary | ICD-10-CM | POA: Diagnosis not present

## 2021-11-09 DIAGNOSIS — H25813 Combined forms of age-related cataract, bilateral: Secondary | ICD-10-CM

## 2021-11-09 DIAGNOSIS — Z8669 Personal history of other diseases of the nervous system and sense organs: Secondary | ICD-10-CM | POA: Diagnosis not present

## 2021-11-12 ENCOUNTER — Encounter (INDEPENDENT_AMBULATORY_CARE_PROVIDER_SITE_OTHER): Payer: Self-pay | Admitting: Ophthalmology

## 2022-01-10 ENCOUNTER — Inpatient Hospital Stay: Payer: 59 | Attending: Hematology and Oncology

## 2022-01-10 ENCOUNTER — Other Ambulatory Visit: Payer: Self-pay

## 2022-01-10 DIAGNOSIS — Z79899 Other long term (current) drug therapy: Secondary | ICD-10-CM | POA: Insufficient documentation

## 2022-01-10 DIAGNOSIS — D472 Monoclonal gammopathy: Secondary | ICD-10-CM | POA: Insufficient documentation

## 2022-01-10 DIAGNOSIS — E559 Vitamin D deficiency, unspecified: Secondary | ICD-10-CM

## 2022-01-10 LAB — CBC WITH DIFFERENTIAL/PLATELET
Abs Immature Granulocytes: 0.02 10*3/uL (ref 0.00–0.07)
Basophils Absolute: 0.1 10*3/uL (ref 0.0–0.1)
Basophils Relative: 1 %
Eosinophils Absolute: 0.2 10*3/uL (ref 0.0–0.5)
Eosinophils Relative: 4 %
HCT: 43.7 % (ref 36.0–46.0)
Hemoglobin: 15.2 g/dL — ABNORMAL HIGH (ref 12.0–15.0)
Immature Granulocytes: 0 %
Lymphocytes Relative: 27 %
Lymphs Abs: 1.5 10*3/uL (ref 0.7–4.0)
MCH: 30.1 pg (ref 26.0–34.0)
MCHC: 34.8 g/dL (ref 30.0–36.0)
MCV: 86.5 fL (ref 80.0–100.0)
Monocytes Absolute: 0.5 10*3/uL (ref 0.1–1.0)
Monocytes Relative: 9 %
Neutro Abs: 3.2 10*3/uL (ref 1.7–7.7)
Neutrophils Relative %: 59 %
Platelets: 300 10*3/uL (ref 150–400)
RBC: 5.05 MIL/uL (ref 3.87–5.11)
RDW: 12.8 % (ref 11.5–15.5)
WBC: 5.6 10*3/uL (ref 4.0–10.5)
nRBC: 0 % (ref 0.0–0.2)

## 2022-01-10 LAB — COMPREHENSIVE METABOLIC PANEL
ALT: 32 U/L (ref 0–44)
AST: 23 U/L (ref 15–41)
Albumin: 4.6 g/dL (ref 3.5–5.0)
Alkaline Phosphatase: 97 U/L (ref 38–126)
Anion gap: 6 (ref 5–15)
BUN: 11 mg/dL (ref 8–23)
CO2: 27 mmol/L (ref 22–32)
Calcium: 10.9 mg/dL — ABNORMAL HIGH (ref 8.9–10.3)
Chloride: 105 mmol/L (ref 98–111)
Creatinine, Ser: 0.65 mg/dL (ref 0.44–1.00)
GFR, Estimated: >60 mL/min (ref >60)
Glucose, Bld: 103 mg/dL — ABNORMAL HIGH (ref 70–99)
Potassium: 4 mmol/L (ref 3.5–5.1)
Sodium: 138 mmol/L (ref 135–145)
Total Bilirubin: 0.6 mg/dL (ref 0.3–1.2)
Total Protein: 7.8 g/dL (ref 6.5–8.1)

## 2022-01-11 LAB — KAPPA/LAMBDA LIGHT CHAINS
Kappa free light chain: 29.3 mg/L — ABNORMAL HIGH (ref 3.3–19.4)
Kappa, lambda light chain ratio: 4.88 — ABNORMAL HIGH (ref 0.26–1.65)
Lambda free light chains: 6 mg/L (ref 5.7–26.3)

## 2022-01-17 ENCOUNTER — Encounter: Payer: Self-pay | Admitting: Hematology and Oncology

## 2022-01-17 ENCOUNTER — Inpatient Hospital Stay: Payer: 59 | Admitting: Hematology and Oncology

## 2022-01-17 ENCOUNTER — Other Ambulatory Visit: Payer: Self-pay

## 2022-01-17 DIAGNOSIS — D472 Monoclonal gammopathy: Secondary | ICD-10-CM | POA: Diagnosis not present

## 2022-01-17 LAB — MULTIPLE MYELOMA PANEL, SERUM
Albumin SerPl Elph-Mcnc: 4 g/dL (ref 2.9–4.4)
Albumin/Glob SerPl: 1.3 (ref 0.7–1.7)
Alpha 1: 0.2 g/dL (ref 0.0–0.4)
Alpha2 Glob SerPl Elph-Mcnc: 0.8 g/dL (ref 0.4–1.0)
B-Globulin SerPl Elph-Mcnc: 1 g/dL (ref 0.7–1.3)
Gamma Glob SerPl Elph-Mcnc: 1 g/dL (ref 0.4–1.8)
Globulin, Total: 3.1 g/dL (ref 2.2–3.9)
IgA: 50 mg/dL — ABNORMAL LOW (ref 87–352)
IgG (Immunoglobin G), Serum: 1143 mg/dL (ref 586–1602)
IgM (Immunoglobulin M), Srm: 33 mg/dL (ref 26–217)
M Protein SerPl Elph-Mcnc: 0.8 g/dL — ABNORMAL HIGH
Total Protein ELP: 7.1 g/dL (ref 6.0–8.5)

## 2022-01-17 NOTE — Assessment & Plan Note (Signed)
Her blood work is stable with no signs of progression, renal failure, hypercalcemia or anemia Recent mild hypercalcemia and erythrocytosis is a sign of slight dehydration Clinically, she has no signs of disease progression. I will see her on a yearly basis with history, physical examination, blood work to be done 1 week ahead of time prior to her next year return visit.

## 2022-01-17 NOTE — Progress Notes (Signed)
Erika Bolton OFFICE PROGRESS NOTE  Patient Care Team: Lavone Orn, MD as PCP - General (Internal Medicine) [name], MD as Consulting Physician (Gastroenterology)  ASSESSMENT & PLAN:  Monoclonal gammopathy of unknown significance (MGUS) Her blood work is stable with no signs of progression, renal failure, hypercalcemia or anemia Recent mild hypercalcemia and erythrocytosis is a sign of slight dehydration Clinically, she has no signs of disease progression. I will see her on a yearly basis with history, physical examination, blood work to be done 1 week ahead of time prior to her next year return visit.  Hypercalcemia She has very mild hypercalcemia but I do not believe this is due to multiple myeloma I recommend increase hydration as tolerated   No orders of the defined types were placed in this encounter.   All questions were answered. The patient knows to call the clinic with any problems, questions or concerns. The total time spent in the appointment was 20 minutes encounter with patients including review of chart and various tests results, discussions about plan of care and coordination of care plan   Erika Lark, MD 01/17/2022 12:14 PM  INTERVAL HISTORY: Please see below for problem oriented charting. she returns for surveillance follow-up for history of IgG kappa MGUS She is here accompanied by her husband She is physically very active and goes to gym at least 4-5 times a week No new bone pain no recent infection  REVIEW OF SYSTEMS:   Constitutional: Denies fevers, chills or abnormal weight loss Eyes: Denies blurriness of vision Ears, nose, mouth, throat, and face: Denies mucositis or sore throat Respiratory: Denies cough, dyspnea or wheezes Cardiovascular: Denies palpitation, chest discomfort or lower extremity swelling Gastrointestinal:  Denies nausea, heartburn or change in bowel habits Skin: Denies abnormal skin rashes Lymphatics: Denies new  lymphadenopathy or easy bruising Neurological:Denies numbness, tingling or new weaknesses Behavioral/Psych: Mood is stable, no new changes  All other systems were reviewed with the patient and are negative.  I have reviewed the past medical history, past surgical history, social history and family history with the patient and they are unchanged from previous note.  ALLERGIES:  is allergic to other, penicillins, gold sodium thiosulfate, and nickel.  MEDICATIONS:  Current Outpatient Medications  Medication Sig Dispense Refill   ALPRAZolam (XANAX) 0.25 MG tablet Take 0.125-0.25 mg by mouth daily as needed. (Patient not taking: Reported on 11/09/2021)     amLODipine (NORVASC) 5 MG tablet Take 5 mg by mouth daily.     Calcium Carbonate (CALTRATE 600 PO) Take by mouth.     carboxymethylcellulose (REFRESH PLUS) 0.5 % SOLN Apply to eye.     cholecalciferol (VITAMIN D) 1000 UNITS tablet Take 2,000 Units by mouth daily.     Magnesium Citrate 125 MG CAPS Take 150 mg by mouth See admin instructions. 150 mg     metroNIDAZOLE (METROGEL) 0.75 % gel metronidazole 0.75 % topical gel  APPLY TO AFFECTED AREA EVERY DAY     Multiple Vitamins-Minerals (WOMENS MULTIVITAMIN PLUS PO) Take 1 tablet by mouth daily.     Omega 3 1200 MG CAPS Take 2 capsules by mouth daily.     omeprazole (PRILOSEC) 20 MG capsule Take by mouth daily.  3   tretinoin (RETIN-A) 0.025 % cream SMARTSIG:sparingly Topical Every Evening     No current facility-administered medications for this visit.    SUMMARY OF ONCOLOGIC HISTORY: Erika Bolton was transferred to my care after her prior physician has left.  I reviewed the patient's  records extensive and collaborated the history with the patient. Summary of her history is as follows: This patient was discovered to have MGUS after she was found to have abnormal blood tests. Blood work revealed IgG kappa She was observed.  PHYSICAL EXAMINATION: ECOG PERFORMANCE STATUS: 0 -  Asymptomatic  Vitals:   01/17/22 1134  BP: (!) 143/69  Pulse: 84  Resp: 18  Temp: (!) 97.5 F (36.4 C)  SpO2: 99%   Filed Weights   01/17/22 1134  Weight: 175 lb 6.4 oz (79.6 kg)    GENERAL:alert, no distress and comfortable NEURO: alert & oriented x 3 with fluent speech, no focal motor/sensory deficits  LABORATORY DATA:  I have reviewed the data as listed    Component Value Date/Time   NA 138 01/10/2022 1131   NA 140 01/09/2017 1523   K 4.0 01/10/2022 1131   K 4.1 01/09/2017 1523   CL 105 01/10/2022 1131   CL 109 (H) 01/10/2013 1446   CO2 27 01/10/2022 1131   CO2 23 01/09/2017 1523   GLUCOSE 103 (H) 01/10/2022 1131   GLUCOSE 94 01/09/2017 1523   GLUCOSE 116 (H) 01/10/2013 1446   BUN 11 01/10/2022 1131   BUN 14.1 01/09/2017 1523   CREATININE 0.65 01/10/2022 1131   CREATININE 0.59 01/14/2021 1512   CREATININE 0.7 01/09/2017 1523   CALCIUM 10.9 (H) 01/10/2022 1131   CALCIUM 10.5 (H) 01/09/2017 1523   PROT 7.8 01/10/2022 1131   PROT 7.1 01/09/2017 1523   PROT 7.5 01/09/2017 1523   ALBUMIN 4.6 01/10/2022 1131   ALBUMIN 4.0 01/09/2017 1523   AST 23 01/10/2022 1131   AST 39 01/14/2021 1512   AST 27 01/09/2017 1523   ALT 32 01/10/2022 1131   ALT 47 (H) 01/14/2021 1512   ALT 31 01/09/2017 1523   ALKPHOS 97 01/10/2022 1131   ALKPHOS 94 01/09/2017 1523   BILITOT 0.6 01/10/2022 1131   BILITOT 0.4 01/14/2021 1512   BILITOT 0.45 01/09/2017 1523   GFRNONAA >60 01/10/2022 1131   GFRNONAA >60 01/14/2021 1512   GFRAA >60 01/09/2020 1115    No results found for: "SPEP", "UPEP"  Lab Results  Component Value Date   WBC 5.6 01/10/2022   NEUTROABS 3.2 01/10/2022   HGB 15.2 (H) 01/10/2022   HCT 43.7 01/10/2022   MCV 86.5 01/10/2022   PLT 300 01/10/2022      Chemistry      Component Value Date/Time   NA 138 01/10/2022 1131   NA 140 01/09/2017 1523   K 4.0 01/10/2022 1131   K 4.1 01/09/2017 1523   CL 105 01/10/2022 1131   CL 109 (H) 01/10/2013 1446   CO2 27  01/10/2022 1131   CO2 23 01/09/2017 1523   BUN 11 01/10/2022 1131   BUN 14.1 01/09/2017 1523   CREATININE 0.65 01/10/2022 1131   CREATININE 0.59 01/14/2021 1512   CREATININE 0.7 01/09/2017 1523      Component Value Date/Time   CALCIUM 10.9 (H) 01/10/2022 1131   CALCIUM 10.5 (H) 01/09/2017 1523   ALKPHOS 97 01/10/2022 1131   ALKPHOS 94 01/09/2017 1523   AST 23 01/10/2022 1131   AST 39 01/14/2021 1512   AST 27 01/09/2017 1523   ALT 32 01/10/2022 1131   ALT 47 (H) 01/14/2021 1512   ALT 31 01/09/2017 1523   BILITOT 0.6 01/10/2022 1131   BILITOT 0.4 01/14/2021 1512   BILITOT 0.45 01/09/2017 1523

## 2022-01-17 NOTE — Assessment & Plan Note (Signed)
She has very mild hypercalcemia but I do not believe this is due to multiple myeloma I recommend increase hydration as tolerated

## 2022-02-14 NOTE — Progress Notes (Signed)
See note above

## 2022-02-22 ENCOUNTER — Other Ambulatory Visit (HOSPITAL_COMMUNITY)
Admission: RE | Admit: 2022-02-22 | Discharge: 2022-02-22 | Disposition: A | Discharge status: Home / Self Care | Payer: 59 | Source: Ambulatory Visit | Attending: Obstetrics and Gynecology | Admitting: Obstetrics and Gynecology

## 2022-02-22 ENCOUNTER — Encounter: Payer: Self-pay | Admitting: Obstetrics and Gynecology

## 2022-02-22 ENCOUNTER — Ambulatory Visit (INDEPENDENT_AMBULATORY_CARE_PROVIDER_SITE_OTHER): Payer: 59 | Admitting: Obstetrics and Gynecology

## 2022-02-22 VITALS — BP 128/78 | HR 86 | Resp 20 | Ht 64.37 in | Wt 170.0 lb

## 2022-02-22 DIAGNOSIS — Z01419 Encounter for gynecological examination (general) (routine) without abnormal findings: Secondary | ICD-10-CM

## 2022-02-22 DIAGNOSIS — Z124 Encounter for screening for malignant neoplasm of cervix: Secondary | ICD-10-CM

## 2022-02-22 DIAGNOSIS — E559 Vitamin D deficiency, unspecified: Secondary | ICD-10-CM

## 2022-02-22 DIAGNOSIS — Z8739 Personal history of other diseases of the musculoskeletal system and connective tissue: Secondary | ICD-10-CM | POA: Diagnosis not present

## 2022-02-22 NOTE — Patient Instructions (Signed)

## 2022-02-22 NOTE — Progress Notes (Signed)
66 y.o. G26P2002 Married White or Caucasian Not Hispanic or Latino female here for annual exam.  No vaginal bleeding. Rarely sexually active, no pain.   No bowel or bladder concerns.    She is being followed by Hematology for a monoclonal gammopathy of unknown significance, stable for at least 15 years.   Patient's last menstrual period was 08/01/2007 (approximate).          Sexually active: Yes.    The current method of family planning is post menopausal status.    Exercising: Yes.     Smoker:  no  Health Maintenance: Pap:    01/15/19 negative (no HPV testing),  11-07-17 neg HPV HR neg, History of abnormal Pap:  yes f/u normal  MMG:  08/25/21 Bi-rads 1 neg  BMD:   09/19/21 osteopenia, T score  -2.1 FRAX 8.8/0.9% Colonoscopy: 05/28/18 f/u 5 years TDaP:  2018  Gardasil: n/a   reports that she has never smoked. She has never used smokeless tobacco. She reports current alcohol use of about 3.0 standard drinks of alcohol per week. She reports that she does not use drugs. Retired in 2/23, loving it. 2 grown children, 81 year old grandson.   Past Medical History:  Diagnosis Date   Colon polyps 2003   Detached retina    Hypertension    Hypertensive retinopathy    Kidney stone 2016   Left bundle branch block 2012   Menorrhagia    MGUS (monoclonal gammopathy of unknown significance)    Plantar fasciitis    Precancerous lesion 1987   mole excised from vulva   PVD (posterior vitreous detachment), right 09/07/2015    Past Surgical History:  Procedure Laterality Date   ABLATION  11/10/2002   Hysteroscopic Thermal   CATARACT EXTRACTION     cyst on scalp     sebaccous(multiple times as a teen)   EYE SURGERY     gastro surgery  07/31/2010   LAPAROSCOPIC CHOLECYSTECTOMY  11/28/2001   left leg muscle surgery  07/31/2010   OTHER SURGICAL HISTORY     Sebaceous cyst on head removed 7x's   RETINAL DETACHMENT SURGERY      Current Outpatient Medications  Medication Sig Dispense Refill    amLODipine (NORVASC) 5 MG tablet Take 5 mg by mouth daily.     Calcium Carbonate (CALTRATE 600 PO) Take by mouth.     carboxymethylcellulose (REFRESH PLUS) 0.5 % SOLN Apply to eye.     cholecalciferol (VITAMIN D) 1000 UNITS tablet Take 2,000 Units by mouth daily.     Magnesium Citrate 125 MG CAPS Take 150 mg by mouth See admin instructions. 150 mg     metroNIDAZOLE (METROGEL) 0.75 % gel metronidazole 0.75 % topical gel  APPLY TO AFFECTED AREA EVERY DAY     Multiple Vitamins-Minerals (WOMENS MULTIVITAMIN PLUS PO) Take 1 tablet by mouth daily.     Omega 3 1200 MG CAPS Take 2 capsules by mouth daily.     omeprazole (PRILOSEC) 20 MG capsule Take by mouth daily.  3   tretinoin (RETIN-A) 0.025 % cream SMARTSIG:sparingly Topical Every Evening     No current facility-administered medications for this visit.    Family History  Problem Relation Age of Onset   Colon cancer Father 8   Alzheimer's disease Father    Emphysema Father    COPD Father    Heart attack Mother    Cancer Brother        peritod gland cancer   Breast cancer Maternal Aunt  post menopause    Review of Systems  Exam:   Ht 5' 4.37" (1.635 m)   Wt 170 lb (77.1 kg)   LMP 08/01/2007 (Approximate)   BMI 28.85 kg/m   Weight change: '@WEIGHTCHANGE'$ @ Height:   Height: 5' 4.37" (163.5 cm)  Ht Readings from Last 3 Encounters:  02/22/22 5' 4.37" (1.635 m)  01/17/22 5' 4.25" (1.632 m)  10/03/21 5' 4.25" (1.632 m)    General appearance: alert, cooperative and appears stated age Head: Normocephalic, without obvious abnormality, atraumatic Neck: no adenopathy, supple, symmetrical, trachea midline and thyroid normal to inspection and palpation Lungs: clear to auscultation bilaterally Cardiovascular: regular rate and rhythm Breasts: normal appearance, no masses or tenderness Abdomen: soft, non-tender; non distended,  no masses,  no organomegaly Extremities: extremities normal, atraumatic, no cyanosis or edema Skin: Skin  color, texture, turgor normal. No rashes or lesions Lymph nodes: Cervical, supraclavicular, and axillary nodes normal. No abnormal inguinal nodes palpated Neurologic: Grossly normal   Pelvic: External genitalia:  no lesions              Urethra:  normal appearing urethra with no masses, tenderness or lesions              Bartholins and Skenes: normal                 Vagina: normal appearing vagina with normal color and discharge, no lesions              Cervix: no lesions               Bimanual Exam:  Uterus:  normal size, contour, position, consistency, mobility, non-tender              Adnexa: no mass, fullness, tenderness               Rectovaginal: Confirms               Anus:  normal sphincter tone, no lesions  Glorianne Manchester, RN chaperoned for the exam.  1. Well woman exam Discussed breast self exam Mammogram UTD Colonoscopy UTD Screening labs with primary  2. Screening for cervical cancer - Cytology - PAP  3. History of osteopenia Discussed calcium and vit D intake DEXA in 2/25  4. Vitamin D deficiency - VITAMIN D 25 Hydroxy (Vit-D Deficiency, Fractures)

## 2022-02-23 LAB — VITAMIN D 25 HYDROXY (VIT D DEFICIENCY, FRACTURES): Vit D, 25-Hydroxy: 43 ng/mL (ref 30–100)

## 2022-03-02 LAB — CYTOLOGY - PAP
Comment: NEGATIVE
High risk HPV: NEGATIVE

## 2022-03-03 ENCOUNTER — Other Ambulatory Visit: Payer: Self-pay | Admitting: *Deleted

## 2022-03-03 DIAGNOSIS — R87619 Unspecified abnormal cytological findings in specimens from cervix uteri: Secondary | ICD-10-CM

## 2022-03-13 ENCOUNTER — Ambulatory Visit (INDEPENDENT_AMBULATORY_CARE_PROVIDER_SITE_OTHER): Payer: 59 | Admitting: Obstetrics and Gynecology

## 2022-03-13 ENCOUNTER — Telehealth: Payer: Self-pay

## 2022-03-13 ENCOUNTER — Other Ambulatory Visit (HOSPITAL_COMMUNITY)
Admission: RE | Admit: 2022-03-13 | Discharge: 2022-03-13 | Disposition: A | Discharge status: Home / Self Care | Payer: 59 | Source: Ambulatory Visit | Attending: Obstetrics and Gynecology | Admitting: Obstetrics and Gynecology

## 2022-03-13 ENCOUNTER — Encounter: Payer: Self-pay | Admitting: Obstetrics and Gynecology

## 2022-03-13 VITALS — BP 160/82 | HR 87 | Ht 64.5 in | Wt 169.0 lb

## 2022-03-13 DIAGNOSIS — N882 Stricture and stenosis of cervix uteri: Secondary | ICD-10-CM

## 2022-03-13 DIAGNOSIS — R87619 Unspecified abnormal cytological findings in specimens from cervix uteri: Secondary | ICD-10-CM | POA: Insufficient documentation

## 2022-03-13 MED ORDER — MISOPROSTOL 200 MCG PO TABS: ORAL_TABLET | ORAL | 0 refills | Status: DC

## 2022-03-13 NOTE — Telephone Encounter (Signed)
Patient called because she is scheduled for ECC/endo bx this afternoon and she had two questions.  #1 was could she drive herself home after procedure. I advised patient she should be fine to drive home  #2 Will she need pads for after procedure. I advised probably a mini pad or panty liner.

## 2022-03-13 NOTE — Progress Notes (Signed)
GYNECOLOGY  VISIT   HPI: 66 y.o.   Married White or Caucasian Not Hispanic or Latino  female   [phone] with Patient's last menstrual period was 08/01/2007 (approximate).   here for endo bx and ECC. Recent pap with atypical endometrial cells, NOS. No vaginal bleeding.   GYNECOLOGIC HISTORY: Patient's last menstrual period was 08/01/2007 (approximate). Contraception:pmp  Menopausal hormone therapy: none         OB History     Gravida  2   Para  2   Term  2   Preterm  0   AB  0   Living  2      SAB  0   IAB  0   Ectopic  0   Multiple  0   Live Births  2              Patient Active Problem List   Diagnosis Date Noted   Cervical spinal stenosis 10/06/2021   Numbness 10/03/2021   Hyperreflexia 10/03/2021   Gait disturbance 10/03/2021   Epiretinal membrane (ERM) of both eyes 02/16/2021   Family history of colon cancer in father 02/14/2018   GERD (gastroesophageal reflux disease) 02/14/2018   History of adenomatous polyp of colon 02/14/2018   Transaminitis 02/14/2018   Sprain of right ankle 01/14/2018   Hypercalcemia 01/17/2017   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   Kidney stone    Fatty infiltration of liver 01/18/2015   Vitamin D deficiency 07/27/2014   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   Monoclonal gammopathy of unknown significance (MGUS) 01/05/2012    Past Medical History:  Diagnosis Date   Colon polyps 2003   Detached retina    Hypertension    Hypertensive retinopathy    Kidney stone 2016   Left bundle branch block 2012   Menorrhagia    MGUS (monoclonal gammopathy of unknown significance)    Plantar fasciitis    Precancerous lesion 1987   mole excised from vulva   PVD (posterior vitreous detachment), right 09/07/2015    Past Surgical History:  Procedure Laterality Date   ABLATION  11/10/2002   Hysteroscopic Thermal   CATARACT EXTRACTION     cyst on scalp     sebaccous(multiple times as  a teen)   EYE SURGERY     gastro surgery  07/31/2010   LAPAROSCOPIC CHOLECYSTECTOMY  11/28/2001   left leg muscle surgery  07/31/2010   OTHER SURGICAL HISTORY     Sebaceous cyst on head removed 7x's   RETINAL DETACHMENT SURGERY      Current Outpatient Medications  Medication Sig Dispense Refill   amLODipine (NORVASC) 5 MG tablet Take 5 mg by mouth daily.     Calcium Carbonate (CALTRATE 600 PO) Take by mouth.     carboxymethylcellulose (REFRESH PLUS) 0.5 % SOLN Apply to eye.     cholecalciferol (VITAMIN D) 1000 UNITS tablet Take 2,000 Units by mouth daily.     Magnesium Citrate 125 MG CAPS Take 150 mg by mouth See admin instructions. 150 mg     metroNIDAZOLE (METROGEL) 0.75 % gel metronidazole 0.75 % topical gel  APPLY TO AFFECTED AREA EVERY DAY     Multiple Vitamins-Minerals (WOMENS MULTIVITAMIN PLUS PO) Take 1 tablet by mouth daily.     Omega 3 1200 MG CAPS Take 2 capsules by mouth daily.     omeprazole (PRILOSEC) 20 MG capsule Take by mouth daily.  3   tretinoin (RETIN-A) 0.025 % cream SMARTSIG:sparingly Topical Every  Evening     No current facility-administered medications for this visit.     ALLERGIES: Other, Penicillins, Gold sodium thiosulfate, and Nickel  Family History  Problem Relation Age of Onset   Colon cancer Father 42   Alzheimer's disease Father    Emphysema Father    COPD Father    Heart attack Mother    Cancer Brother        peritod gland cancer   Breast cancer Maternal Aunt        post menopause    Social History   Socioeconomic History   Marital status: Married    Spouse name: Not on file   Number of children: 2   Years of education: M.ED.   Highest education level: Not on file  Occupational History    Comment: [employer]  Tobacco Use   Smoking status: Never   Smokeless tobacco: Never  Vaping Use   Vaping Use: Never used  Substance and Sexual Activity   Alcohol use: Yes    Alcohol/week: 3.0 standard drinks of alcohol    Types: 3  Glasses of wine per week   Drug use: No   Sexual activity: Not Currently    Partners: Male    Birth control/protection: Post-menopausal  Other Topics Concern   Not on file  Social History Narrative   2 cups of coffee a day    Social Determinants of Health   Financial Resource Strain: Not on file  Food Insecurity: Not on file  Transportation Needs: Not on file  Physical Activity: Not on file  Stress: Not on file  Social Connections: Not on file  Intimate Partner Violence: Not on file    Review of Systems  All other systems reviewed and are negative.   PHYSICAL EXAMINATION:    BP (!) 160/82   Pulse 87   Ht 5' 4.5" (1.638 m)   Wt 169 lb (76.7 kg)   LMP 08/01/2007 (Approximate)   SpO2 99%   BMI 28.56 kg/m     General appearance: alert, cooperative and appears stated age  Pelvic: External genitalia:  no lesions              Urethra:  normal appearing urethra with no masses, tenderness or lesions              Bartholins and Skenes: normal                 Vagina: normal appearing vagina with normal color and discharge, no lesions              Cervix:  2-3 mm polyp at the external os, stenotic cervix               The risks of endometrial biopsy were reviewed and a consent was obtained.  A speculum was placed in the vagina and the cervix was cleansed with betadine. A tenaculum was placed on the cervix and the cervix was dilated with the mini-dilators, only able to dilate the external os and even this was difficult. ECC obtained, the small polyp was removed and sent with the ECC. Unable to get into the endometrial cavity. The tenaculum and speculum were removed. There were no complications.    Chaperone was present for exam.  1. Atypical endometrial cells on Pap smear - US PELVIS TRANSVAGINAL NON-OB (TV ONLY); Future - Endometrial biopsy; Future - Surgical pathology( [facility])  2. Cervical stenosis (uterine cervix) - Unable to perform an Endometrial biopsy  secondary  to severe cervical stenosis. - US PELVIS TRANSVAGINAL NON-OB (TV ONLY); Future - misoprostol (CYTOTEC) 200 MCG tablet; Place 2 tablets vaginally 6-12 hours prior to the procedure.  Dispense: 2 tablet; Refill: 0 - US PELVIS TRANSVAGINAL NON-OB (TV ONLY); Future

## 2022-03-14 ENCOUNTER — Ambulatory Visit: Payer: 59 | Admitting: Obstetrics and Gynecology

## 2022-03-14 ENCOUNTER — Encounter: Payer: Self-pay | Admitting: Obstetrics and Gynecology

## 2022-03-14 ENCOUNTER — Other Ambulatory Visit (HOSPITAL_COMMUNITY)
Admission: RE | Admit: 2022-03-14 | Discharge: 2022-03-14 | Disposition: A | Discharge status: Home / Self Care | Payer: 59 | Source: Ambulatory Visit | Attending: Obstetrics and Gynecology | Admitting: Obstetrics and Gynecology

## 2022-03-14 ENCOUNTER — Ambulatory Visit (INDEPENDENT_AMBULATORY_CARE_PROVIDER_SITE_OTHER): Payer: 59

## 2022-03-14 ENCOUNTER — Other Ambulatory Visit: Payer: Self-pay | Admitting: Obstetrics and Gynecology

## 2022-03-14 VITALS — BP 128/74 | HR 88 | Ht 64.5 in | Wt 169.0 lb

## 2022-03-14 DIAGNOSIS — N882 Stricture and stenosis of cervix uteri: Secondary | ICD-10-CM

## 2022-03-14 DIAGNOSIS — R87619 Unspecified abnormal cytological findings in specimens from cervix uteri: Secondary | ICD-10-CM

## 2022-03-14 DIAGNOSIS — Z9889 Other specified postprocedural states: Secondary | ICD-10-CM

## 2022-03-14 DIAGNOSIS — R350 Frequency of micturition: Secondary | ICD-10-CM

## 2022-03-14 LAB — URINALYSIS, COMPLETE
Bacteria, UA: NONE SEEN /HPF
Bilirubin Urine: NEGATIVE
Glucose, UA: NEGATIVE
Hyaline Cast: NONE SEEN /LPF
Ketones, ur: NEGATIVE
Leukocytes,Ua: NEGATIVE
Nitrite: NEGATIVE
Protein, ur: NEGATIVE
Specific Gravity, Urine: 1.01 (ref 1.001–1.035)
WBC, UA: NONE SEEN /HPF (ref 0–5)
pH: 6.5 (ref 5.0–8.0)

## 2022-03-14 NOTE — Patient Instructions (Signed)

## 2022-03-14 NOTE — Progress Notes (Signed)
GYNECOLOGY  VISIT   HPI: 66 y.o.   Married White or Caucasian Not Hispanic or Latino  female   [phone] with Patient's last menstrual period was 08/01/2007 (approximate).   here for endometrial biopsy under ultrasound guidance. Yesterday she was seen and I was unable to get into her cavity secondary to cervical stenosis. I was only able to get passed her external cervical os with the mini-dilators. An ECC was obtained yesterday. She has been pretreated with cytotec.   She c/o urinary frequency and urgency to void. No dysuria.   GYNECOLOGIC HISTORY: Patient's last menstrual period was 08/01/2007 (approximate). Contraception:PMP Menopausal hormone therapy: NA        OB History     Gravida  2   Para  2   Term  2   Preterm  0   AB  0   Living  2      SAB  0   IAB  0   Ectopic  0   Multiple  0   Live Births  2              Patient Active Problem List   Diagnosis Date Noted   Cervical spinal stenosis 10/06/2021   Numbness 10/03/2021   Hyperreflexia 10/03/2021   Gait disturbance 10/03/2021   Epiretinal membrane (ERM) of both eyes 02/16/2021   Family history of colon cancer in father 02/14/2018   GERD (gastroesophageal reflux disease) 02/14/2018   History of adenomatous polyp of colon 02/14/2018   Transaminitis 02/14/2018   Sprain of right ankle 01/14/2018   Hypercalcemia 01/17/2017   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   Kidney stone    Fatty infiltration of liver 01/18/2015   Vitamin D deficiency 07/27/2014   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   Monoclonal gammopathy of unknown significance (MGUS) 01/05/2012    Past Medical History:  Diagnosis Date   Colon polyps 2003   Detached retina    Hypertension    Hypertensive retinopathy    Kidney stone 2016   Left bundle branch block 2012   Menorrhagia    MGUS (monoclonal gammopathy of unknown significance)    Plantar fasciitis    Precancerous lesion 1987    mole excised from vulva   PVD (posterior vitreous detachment), right 09/07/2015    Past Surgical History:  Procedure Laterality Date   ABLATION  11/10/2002   Hysteroscopic Thermal   CATARACT EXTRACTION     cyst on scalp     sebaccous(multiple times as a teen)   EYE SURGERY     gastro surgery  07/31/2010   LAPAROSCOPIC CHOLECYSTECTOMY  11/28/2001   left leg muscle surgery  07/31/2010   OTHER SURGICAL HISTORY     Sebaceous cyst on head removed 7x's   RETINAL DETACHMENT SURGERY      Current Outpatient Medications  Medication Sig Dispense Refill   amLODipine (NORVASC) 5 MG tablet Take 5 mg by mouth daily.     Calcium Carbonate (CALTRATE 600 PO) Take by mouth.     carboxymethylcellulose (REFRESH PLUS) 0.5 % SOLN Apply to eye.     cholecalciferol (VITAMIN D) 1000 UNITS tablet Take 2,000 Units by mouth daily.     Magnesium Citrate 125 MG CAPS Take 150 mg by mouth See admin instructions. 150 mg     metroNIDAZOLE (METROGEL) 0.75 % gel metronidazole 0.75 % topical gel  APPLY TO AFFECTED AREA EVERY DAY     misoprostol (CYTOTEC) 200 MCG tablet Place 2 tablets vaginally  6-12 hours prior to the procedure. 2 tablet 0   Multiple Vitamins-Minerals (WOMENS MULTIVITAMIN PLUS PO) Take 1 tablet by mouth daily.     Omega 3 1200 MG CAPS Take 2 capsules by mouth daily.     omeprazole (PRILOSEC) 20 MG capsule Take by mouth daily.  3   tretinoin (RETIN-A) 0.025 % cream SMARTSIG:sparingly Topical Every Evening     No current facility-administered medications for this visit.     ALLERGIES: Other, Penicillins, Gold sodium thiosulfate, and Nickel  Family History  Problem Relation Age of Onset   Colon cancer Father 58   Alzheimer's disease Father    Emphysema Father    COPD Father    Heart attack Mother    Cancer Brother        peritod gland cancer   Breast cancer Maternal Aunt        post menopause    Social History   Socioeconomic History   Marital status: Married    Spouse name: Not on  file   Number of children: 2   Years of education: M.ED.   Highest education level: Not on file  Occupational History    Comment: [employer]  Tobacco Use   Smoking status: Never   Smokeless tobacco: Never  Vaping Use   Vaping Use: Never used  Substance and Sexual Activity   Alcohol use: Yes    Alcohol/week: 3.0 standard drinks of alcohol    Types: 3 Glasses of wine per week   Drug use: No   Sexual activity: Not Currently    Partners: Male    Birth control/protection: Post-menopausal  Other Topics Concern   Not on file  Social History Narrative   2 cups of coffee a day    Social Determinants of Health   Financial Resource Strain: Not on file  Food Insecurity: Not on file  Transportation Needs: Not on file  Physical Activity: Not on file  Stress: Not on file  Social Connections: Not on file  Intimate Partner Violence: Not on file    ROS  PHYSICAL EXAMINATION:    LMP 08/01/2007 (Approximate)     General appearance: alert, cooperative and appears stated age  Pelvic: External genitalia:  no lesions              Urethra:  normal appearing urethra with no masses, tenderness or lesions              Bartholins and Skenes: normal                 Vagina: normal appearing vagina with normal color and discharge, no lesions              Cervix: no lesions and stenotic               The risks of endometrial biopsy were reviewed and a consent was obtained yesterday.  A speculum was placed in the vagina and the cervix was cleansed with betadine. A tenaculum was placed on the cervix. Using ultrasound guidance the cervix was dilated with the mini-dilators, her cervix/LUS curves anteriorly and then posteriorly. Able to get into the cavity with the dilators. The uterine evacuator was placed in the cavity, in the cavity but not all the way at the fundus. The uterus sounded to 5-6 cm. The endometrial biopsy was performed, taking care to get a representative sample, sampling 360  degrees of the uterine cavity. Minimal tissue was obtained. The tenaculum and speculum were removed. There were  no complications.    Chaperone was present for exam.  See ultrasound report  1. Atypical endometrial cells on Pap smear Cervical stenosis, h/o endometrial ablation.  Thin stripe on u/s without obvious masses. Able to get dilators into her cavity with ultrasound guidance. Endometrial biopsy taken, in her cavity but not at her fundus. - Surgical pathology( [facility]) -If her biopsy isn't satisfactory, consider hysteroscopy with ultrasound guidance  2. Cervical stenosis (uterine cervix) Needed cervical dilation  3. History of endometrial ablation She likely has scarring of her cavity which is making the biopsy so difficult  4. Urinary frequency - Urinalysis, Complete - Urine Culture The ccua is + for blood, but collected after the biopsy. Will send for a culture   Addendum: urine collected after the endometrial biopsy

## 2022-03-15 LAB — SURGICAL PATHOLOGY

## 2022-03-16 LAB — URINE CULTURE
MICRO NUMBER:: 13781391
Result:: NO GROWTH
SPECIMEN QUALITY:: ADEQUATE

## 2022-03-16 LAB — SURGICAL PATHOLOGY

## 2022-03-21 ENCOUNTER — Other Ambulatory Visit: Payer: Self-pay | Admitting: Obstetrics and Gynecology

## 2022-03-21 MED ORDER — ESTRADIOL 10 MCG VA TABS: 1.0000 | ORAL_TABLET | VAGINAL | 1 refills | Status: DC

## 2022-03-29 ENCOUNTER — Ambulatory Visit: Payer: 59 | Admitting: Obstetrics and Gynecology

## 2022-05-09 NOTE — Progress Notes (Addendum)
[facility] Note  05/10/2022     CHIEF COMPLAINT Patient presents for Retina Follow Up    HISTORY OF PRESENT ILLNESS: Erika Bolton is a 66 y.o. female who presents to the clinic today for:   HPI     Retina Follow Up   Patient presents with  Other.  In both eyes.  This started 6 months ago.  I, the attending physician,  performed the HPI with the patient and updated documentation appropriately.        Comments   Patient here for 6 months retina follow up for ERM OU. Patient states vision is good. No eye pain.       Last edited by Bernarda Caffey, MD on 05/12/2022  2:47 AM.    Pt states every once in awhile it looks like she is looking through a waterfall OD, she states it doesn't last long, other than that, she feels like she is seeing well, pt has an appt with Dr. Valetta Close in April 2024, no new health concerns  Referring physician: [name], MD [facility] [place], [place] [address]  HISTORICAL INFORMATION:   Selected notes from the MEDICAL RECORD NUMBER Referred by Dr. Valetta Close for ERM OU LEE: 02/15/2021 BCVA OD: 20/30 OS: 20/50 Ocular Hx- history of cryo for retinal tear OD, ERM, cataracts PMH- HTN, anxiety    CURRENT MEDICATIONS: Current Outpatient Medications (Ophthalmic Drugs)  Medication Sig   carboxymethylcellulose (REFRESH PLUS) 0.5 % SOLN Apply to eye.   No current facility-administered medications for this visit. (Ophthalmic Drugs)   Current Outpatient Medications (Other)  Medication Sig   amLODipine (NORVASC) 5 MG tablet Take 5 mg by mouth daily.   Calcium Carbonate (CALTRATE 600 PO) Take by mouth.   cholecalciferol (VITAMIN D) 1000 UNITS tablet Take 2,000 Units by mouth daily.   Estradiol 10 MCG TABS vaginal tablet Place 1 tablet (10 mcg total) vaginally 2 (two) times a week.   Magnesium Citrate 125 MG CAPS Take 150 mg by mouth See admin instructions. 150 mg   metroNIDAZOLE (METROGEL) 0.75 % gel metronidazole 0.75 %  topical gel  APPLY TO AFFECTED AREA EVERY DAY   misoprostol (CYTOTEC) 200 MCG tablet Place 2 tablets vaginally 6-12 hours prior to the procedure.   Multiple Vitamins-Minerals (WOMENS MULTIVITAMIN PLUS PO) Take 1 tablet by mouth daily.   Omega 3 1200 MG CAPS Take 2 capsules by mouth daily.   omeprazole (PRILOSEC) 20 MG capsule Take by mouth daily.   tretinoin (RETIN-A) 0.025 % cream SMARTSIG:sparingly Topical Every Evening   No current facility-administered medications for this visit. (Other)   REVIEW OF SYSTEMS: ROS   Positive for: Gastrointestinal, Eyes Negative for: Constitutional, Neurological, Skin, Genitourinary, Musculoskeletal, HENT, Endocrine, Cardiovascular, Respiratory, Psychiatric, Allergic/Imm, Heme/Lymph Last edited by Theodore Demark, COA on 05/10/2022  2:47 PM.     ALLERGIES Allergies  Allergen Reactions   Other Other (See Comments)    Potassium dichromate, broke out in dry rashes   Penicillins Swelling    Throat swelling   Gold Sodium Thiosulfate Rash   Nickel Rash   PAST MEDICAL HISTORY Past Medical History:  Diagnosis Date   Colon polyps 2003   Detached retina    Hypertension    Hypertensive retinopathy    Kidney stone 2016   Left bundle branch block 2012   Menorrhagia    MGUS (monoclonal gammopathy of unknown significance)    Plantar fasciitis    Precancerous lesion 1987   mole  excised from vulva   PVD (posterior vitreous detachment), right 09/07/2015   Past Surgical History:  Procedure Laterality Date   ABLATION  11/10/2002   Hysteroscopic Thermal   CATARACT EXTRACTION     cyst on scalp     sebaccous(multiple times as a teen)   EYE SURGERY     gastro surgery  07/31/2010   LAPAROSCOPIC CHOLECYSTECTOMY  11/28/2001   left leg muscle surgery  07/31/2010   OTHER SURGICAL HISTORY     Sebaceous cyst on head removed 7x's   RETINAL DETACHMENT SURGERY      FAMILY HISTORY Family History  Problem Relation Age of Onset   Colon cancer Father 21    Alzheimer's disease Father    Emphysema Father    COPD Father    Heart attack Mother    Cancer Brother        peritod gland cancer   Breast cancer Maternal Aunt        post menopause   SOCIAL HISTORY Social History   Tobacco Use   Smoking status: Never   Smokeless tobacco: Never  Vaping Use   Vaping Use: Never used  Substance Use Topics   Alcohol use: Yes    Alcohol/week: 3.0 standard drinks of alcohol    Types: 3 Glasses of wine per week   Drug use: No       OPHTHALMIC EXAM: Base Eye Exam     Visual Acuity (Snellen - Linear)       Right Left   Dist [place]  20/20   Dist cc 20/20     Correction: Contacts  Patient mono vision OD Near with contact in. Also uses readers.        Tonometry (Tonopen, 2:44 PM)       Right Left   Pressure 14 14         Pupils       Dark Light Shape React APD   Right 4 3 Round Minimal None   Left 4 3 Round Minimal None         Visual Fields (Counting fingers)       Left Right    Full Full         Extraocular Movement       Right Left    Full, Ortho Full, Ortho         Neuro/Psych     Oriented x3: Yes   Mood/Affect: Normal         Dilation     Both eyes: 1.0% Mydriacyl, 2.5% Phenylephrine @ 2:44 PM           Slit Lamp and Fundus Exam     Slit Lamp Exam       Right Left   Lids/Lashes Dermatochalasis - upper lid Dermatochalasis - upper lid, Meibomian gland dysfunction   Conjunctiva/Sclera White and quiet White and quiet   Cornea Arcus, well healed temporal cataract wound, Mild tear film debris Arcus, well healed cataract wound   Anterior Chamber deep and clear deep and clear   Iris Round and Dilated Round and Dilated   Lens PC IOL in good position, trace Posterior capsular opacification; no pseudophakodonesis PC IOL in good position, 1+ Posterior capsular opacification   Anterior Vitreous Vitreous syneresis, Posterior vitreous detachment, Vitreous condensations Vitreous syneresis, Vitreous  condensations, Posterior vitreous detachment         Fundus Exam       Right Left   Disc mild pallor, sharp rim, tilted, temp PPA, PPP  Pink and sharp, tilted, temp PPA   C/D Ratio 0.3 0.5   Macula Flat, Blunted foveal reflex, ERM with striae greatest temporal to fovea, focal point of fibrosis temporal to fovea Flat, Good foveal reflex, RPE mottling and clumping, mild ERM, no heme or edema   Vessels mild attenuation, mild tortuosity mild attenuation, mild tortuosity   Periphery Attached, Good cryo changes 1100, Inferior pavingstone, no new RT/RD Attached, pigmented pavingstone degeneration inferiorly, No RT/RD           Refraction     Wearing Rx       Sphere Cylinder Axis   Right -6.00 +1.25 010   Left -6.00 +1.00 022           IMAGING AND PROCEDURES  Imaging and Procedures for 05/10/2022  OCT, Retina - OU - Both Eyes       Right Eye Quality was good. Central Foveal Thickness: 305. Progression has been stable. Findings include no IRF, no SRF, abnormal foveal contour, myopic contour, epiretinal membrane, macular pucker (ERM with peri-foveal thickening greatest temporal fovea, myopic contour, mild interval improvement in central foveal contour).   Left Eye Quality was good. Central Foveal Thickness: 273. Progression has been stable. Findings include normal foveal contour, no IRF, no SRF, myopic contour, epiretinal membrane, macular pucker (Mild ERM/pucker superior macula).   Notes *Images captured and stored on drive  Diagnosis / Impression:  ERM OU (OD>OS) Myopic contour OU OD: ERM with peri-foveal thickening greatest temporal fovea, myopic contour, stable improvement in central foveal contour OS: Mild ERM/pucker superior macula  Clinical management:  See below  Abbreviations: NFP - Normal foveal profile. CME - cystoid macular edema. PED - pigment epithelial detachment. IRF - intraretinal fluid. SRF - subretinal fluid. EZ - ellipsoid zone. ERM - epiretinal  membrane. ORA - outer retinal atrophy. ORT - outer retinal tubulation. SRHM - subretinal hyper-reflective material. IRHM - intraretinal hyper-reflective material            ASSESSMENT/PLAN:    ICD-10-CM   1. Epiretinal membrane (ERM) of both eyes  H35.373 OCT, Retina - OU - Both Eyes    2. History of retinal detachment  Z86.69     3. Essential hypertension  I10     4. Hypertensive retinopathy of both eyes  H35.033     5. Pseudophakia of both eyes  Z96.1      1. Epiretinal membrane, OU (OD>OS) - BCVA 20/20 from 20/25 OD; 20/20 OS  - mild metamorphopsia improved OD - OCT shows stable improvement in foveal contour OD - no indication for surgery at this time - monitor - f/u 6 mos, sooner prn-- DFE/OCT   2. History of retinal detachment OD  - s/p pneumatic retinopexy OD 02.24.18 at [facility] w/ Dr. Maryjean Ka  - HST at 1100--good cryo  - retina stably reattached, no RT/RD  3,4. Hypertensive retinopathy OU - discussed importance of tight BP control - monitor  5. Pseudophakia OU  - s/p CE/IOL OU (Dr. Valetta Close - mid-September '22)  - OD with CTR -- no pseudophakodonesis  - IOLs in good position, doing well  - monitor  Ophthalmic Meds Ordered this visit:  No orders of the defined types were placed in this encounter.    Return in about 6 months (around 11/09/2022) for f/u ERM OU, DFE, OCT.  There are no Patient Instructions on file for this visit.  Explained the diagnoses, plan, and follow up with the patient and they expressed understanding.  Patient expressed understanding of the  importance of proper follow up care.   This document serves as a record of services personally performed by Gardiner Sleeper, MD, PhD. It was created on their behalf by Orvan Falconer, an ophthalmic technician. The creation of this record is the provider's dictation and/or activities during the visit.    Electronically signed by: Orvan Falconer, OA, 05/12/22  2:57 AM  This document serves as a  record of services personally performed by Gardiner Sleeper, MD, PhD. It was created on their behalf by San Jetty. Owens Shark, OA an ophthalmic technician. The creation of this record is the provider's dictation and/or activities during the visit.    Electronically signed by: San Jetty. [place], [place] 10.11.2023 2:57 AM  Gardiner Sleeper, M.D., Ph.D. Diseases & Surgery of the Retina and Vitreous [facility]  I have reviewed the above documentation for accuracy and completeness, and I agree with the above. Gardiner Sleeper, M.D., Ph.D. 05/12/22 2:57 AM  Abbreviations: M myopia (nearsighted); A astigmatism; H hyperopia (farsighted); P presbyopia; Mrx spectacle prescription;  CTL contact lenses; OD right eye; OS left eye; OU both eyes  XT exotropia; ET esotropia; PEK punctate epithelial keratitis; PEE punctate epithelial erosions; DES dry eye syndrome; MGD meibomian gland dysfunction; ATs artificial tears; PFAT's preservative free artificial tears; [place] nuclear sclerotic cataract; PSC posterior subcapsular cataract; ERM epi-retinal membrane; PVD posterior vitreous detachment; RD retinal detachment; DM diabetes mellitus; DR diabetic retinopathy; NPDR non-proliferative diabetic retinopathy; PDR proliferative diabetic retinopathy; CSME clinically significant macular edema; DME diabetic macular edema; dbh dot blot hemorrhages; CWS cotton wool spot; POAG primary open angle glaucoma; C/D cup-to-disc ratio; HVF humphrey visual field; GVF goldmann visual field; OCT optical coherence tomography; IOP intraocular pressure; BRVO Branch retinal vein occlusion; CRVO central retinal vein occlusion; CRAO central retinal artery occlusion; BRAO branch retinal artery occlusion; RT retinal tear; SB scleral buckle; PPV pars plana vitrectomy; VH Vitreous hemorrhage; PRP panretinal laser photocoagulation; IVK intravitreal kenalog; VMT vitreomacular traction; MH Macular hole;  NVD neovascularization of the disc; NVE  neovascularization elsewhere; AREDS age related eye disease study; ARMD age related macular degeneration; POAG primary open angle glaucoma; EBMD epithelial/anterior basement membrane dystrophy; ACIOL anterior chamber intraocular lens; IOL intraocular lens; PCIOL posterior chamber intraocular lens; Phaco/IOL phacoemulsification with intraocular lens placement; [place] photorefractive keratectomy; LASIK laser assisted in situ keratomileusis; HTN hypertension; DM diabetes mellitus; COPD chronic obstructive pulmonary disease

## 2022-05-10 ENCOUNTER — Encounter (INDEPENDENT_AMBULATORY_CARE_PROVIDER_SITE_OTHER): Payer: Self-pay | Admitting: Ophthalmology

## 2022-05-10 ENCOUNTER — Ambulatory Visit (INDEPENDENT_AMBULATORY_CARE_PROVIDER_SITE_OTHER): Payer: 59 | Admitting: Ophthalmology

## 2022-05-10 DIAGNOSIS — H35033 Hypertensive retinopathy, bilateral: Secondary | ICD-10-CM | POA: Diagnosis not present

## 2022-05-10 DIAGNOSIS — I10 Essential (primary) hypertension: Secondary | ICD-10-CM

## 2022-05-10 DIAGNOSIS — H35373 Puckering of macula, bilateral: Secondary | ICD-10-CM

## 2022-05-10 DIAGNOSIS — H25813 Combined forms of age-related cataract, bilateral: Secondary | ICD-10-CM

## 2022-05-10 DIAGNOSIS — Z961 Presence of intraocular lens: Secondary | ICD-10-CM

## 2022-05-10 DIAGNOSIS — Z8669 Personal history of other diseases of the nervous system and sense organs: Secondary | ICD-10-CM | POA: Diagnosis not present

## 2022-05-12 ENCOUNTER — Encounter (INDEPENDENT_AMBULATORY_CARE_PROVIDER_SITE_OTHER): Payer: Self-pay | Admitting: Ophthalmology

## 2022-05-23 ENCOUNTER — Other Ambulatory Visit (HOSPITAL_BASED_OUTPATIENT_CLINIC_OR_DEPARTMENT_OTHER): Payer: Self-pay

## 2022-05-23 MED ORDER — COMIRNATY 30 MCG/0.3ML IM SUSY
PREFILLED_SYRINGE | INTRAMUSCULAR | 0 refills | Status: DC
  Filled 2022-05-23: qty 0.3, 1d supply, fill #0

## 2022-06-14 ENCOUNTER — Encounter: Payer: Self-pay | Admitting: Obstetrics and Gynecology

## 2022-06-14 ENCOUNTER — Other Ambulatory Visit (HOSPITAL_COMMUNITY)
Admission: RE | Admit: 2022-06-14 | Discharge: 2022-06-14 | Disposition: A | Discharge status: Home / Self Care | Payer: 59 | Source: Ambulatory Visit | Attending: Obstetrics and Gynecology | Admitting: Obstetrics and Gynecology

## 2022-06-14 ENCOUNTER — Ambulatory Visit: Payer: 59 | Admitting: Obstetrics and Gynecology

## 2022-06-14 VITALS — BP 120/72 | HR 66 | Wt 166.0 lb

## 2022-06-14 DIAGNOSIS — R87619 Unspecified abnormal cytological findings in specimens from cervix uteri: Secondary | ICD-10-CM | POA: Insufficient documentation

## 2022-06-14 DIAGNOSIS — Z124 Encounter for screening for malignant neoplasm of cervix: Secondary | ICD-10-CM | POA: Diagnosis present

## 2022-06-14 NOTE — Progress Notes (Signed)
GYNECOLOGY  VISIT   HPI: 66 y.o.   Married White or Caucasian Not Hispanic or Latino  female   [phone] with Patient's last menstrual period was 08/01/2007 (approximate).   here for  repeat pap   H/O endometrial ablation. Pap from 02/22/22 with atypical endometrial cells, NOS. She returned on 03/13/22 for an ECC and endometrial biopsy was not diagnostic. Biopsy was done with u/s guidance.   I spoke with Dr Saralyn Pilar in Pathology, reviewed her history of endometrial ablation and the inability to get an adequate endometrial biopsy even with ultrasound guidance. He stated that with atrophy the endocervical cells can appear like endometrial cells. He felt that treating her with vaginal estrogen and repeating her pap in 3-6 months was the most appropriate course. He didn't feel hysterectomy was warranted at this time. We discussed the option of ultrasound guided hysteroscopy, but with the ablation it is very likely no endometrial tissue would be obtained. I discussed this with the patient and will call in the vaginal estrogen. Pap recall for 3 months.   Salvadore Dom, MD 03/21/2022 11:25 AM EDT               She was started on vaginal estrogen and is here for a f/u pap  GYNECOLOGIC HISTORY: Patient's last menstrual period was 08/01/2007 (approximate). Contraception:pmp  Menopausal hormone therapy: estradiol tab         OB History     Gravida  2   Para  2   Term  2   Preterm  0   AB  0   Living  2      SAB  0   IAB  0   Ectopic  0   Multiple  0   Live Births  2              Patient Active Problem List   Diagnosis Date Noted   Cervical spinal stenosis 10/06/2021   Numbness 10/03/2021   Hyperreflexia 10/03/2021   Gait disturbance 10/03/2021   Epiretinal membrane (ERM) of both eyes 02/16/2021   Family history of colon cancer in father 02/14/2018   GERD (gastroesophageal reflux disease) 02/14/2018   History of adenomatous polyp of colon 02/14/2018    Transaminitis 02/14/2018   Sprain of right ankle 01/14/2018   Hypercalcemia 01/17/2017   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   Kidney stone    Fatty infiltration of liver 01/18/2015   Vitamin D deficiency 07/27/2014   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   Monoclonal gammopathy of unknown significance (MGUS) 01/05/2012    Past Medical History:  Diagnosis Date   Colon polyps 2003   Detached retina    Hypertension    Hypertensive retinopathy    Kidney stone 2016   Left bundle branch block 2012   Menorrhagia    MGUS (monoclonal gammopathy of unknown significance)    Plantar fasciitis    Precancerous lesion 1987   mole excised from vulva   PVD (posterior vitreous detachment), right 09/07/2015    Past Surgical History:  Procedure Laterality Date   ABLATION  11/10/2002   Hysteroscopic Thermal   CATARACT EXTRACTION     cyst on scalp     sebaccous(multiple times as a teen)   EYE SURGERY     gastro surgery  07/31/2010   LAPAROSCOPIC CHOLECYSTECTOMY  11/28/2001   left leg muscle surgery  07/31/2010   OTHER SURGICAL HISTORY     Sebaceous cyst on head removed 7x's  RETINAL DETACHMENT SURGERY      Current Outpatient Medications  Medication Sig Dispense Refill   amLODipine (NORVASC) 5 MG tablet Take 5 mg by mouth daily.     Calcium Carbonate (CALTRATE 600 PO) Take by mouth.     carboxymethylcellulose (REFRESH PLUS) 0.5 % SOLN Apply to eye.     cholecalciferol (VITAMIN D) 1000 UNITS tablet Take 2,000 Units by mouth daily.     COVID-19 mRNA vaccine 2023-2024 (COMIRNATY) syringe Inject into the muscle. 0.3 mL 0   Estradiol 10 MCG TABS vaginal tablet Place 1 tablet (10 mcg total) vaginally 2 (two) times a week. 24 tablet 1   Magnesium Citrate 125 MG CAPS Take 150 mg by mouth See admin instructions. 150 mg     metroNIDAZOLE (METROGEL) 0.75 % gel metronidazole 0.75 % topical gel  APPLY TO AFFECTED AREA EVERY DAY     Multiple  Vitamins-Minerals (WOMENS MULTIVITAMIN PLUS PO) Take 1 tablet by mouth daily.     Omega 3 1200 MG CAPS Take 2 capsules by mouth daily.     omeprazole (PRILOSEC) 20 MG capsule Take by mouth daily.  3   tretinoin (RETIN-A) 0.025 % cream SMARTSIG:sparingly Topical Every Evening     No current facility-administered medications for this visit.     ALLERGIES: Other, Penicillins, Gold sodium thiosulfate, and Nickel  Family History  Problem Relation Age of Onset   Colon cancer Father 5   Alzheimer's disease Father    Emphysema Father    COPD Father    Heart attack Mother    Cancer Brother        peritod gland cancer   Breast cancer Maternal Aunt        post menopause    Social History   Socioeconomic History   Marital status: Married    Spouse name: Not on file   Number of children: 2   Years of education: M.ED.   Highest education level: Not on file  Occupational History    Comment: [employer]  Tobacco Use   Smoking status: Never   Smokeless tobacco: Never  Vaping Use   Vaping Use: Never used  Substance and Sexual Activity   Alcohol use: Yes    Alcohol/week: 3.0 standard drinks of alcohol    Types: 3 Glasses of wine per week   Drug use: No   Sexual activity: Not Currently    Partners: Male    Birth control/protection: Post-menopausal  Other Topics Concern   Not on file  Social History Narrative   2 cups of coffee a day    Social Determinants of Health   Financial Resource Strain: Not on file  Food Insecurity: Not on file  Transportation Needs: Not on file  Physical Activity: Not on file  Stress: Not on file  Social Connections: Not on file  Intimate Partner Violence: Not on file    Review of Systems  All other systems reviewed and are negative.   PHYSICAL EXAMINATION:    BP 120/72   Pulse 66   Wt 166 lb (75.3 kg)   LMP 08/01/2007 (Approximate)   SpO2 100%   BMI 28.05 kg/m     General appearance: alert, cooperative and appears stated  age  Pelvic: External genitalia:  no lesions              Urethra:  normal appearing urethra with no masses, tenderness or lesions              Bartholins and Skenes: normal  Vagina: normal appearing vagina with normal color and discharge, no lesions              Cervix: no lesions               Chaperone was present for exam.  1. Atypical endometrial cells on Pap smear Negative ECC, non diagnostic EMC (h/o ablation). Pathologist recommended vaginal estrogen and repeating her pap. She has been on vaginal estrogen - Cytology - PAP  2. Screening for cervical cancer - Cytology - PAP

## 2022-06-20 LAB — CYTOLOGY - PAP: Diagnosis: NEGATIVE

## 2022-07-24 IMAGING — MR MR LUMBAR SPINE W/O CM
4 of 5 series · 26 of 48 positions shown · non-contrast
Comparison: No direct comparison study available. CT abdomen
02/09/2020, MR abdomen 02/05/2015

CLINICAL DATA: Tightness in back with bilateral leg numbness after
exercising. Some weakness

EXAM:
MRI LUMBAR SPINE WITHOUT CONTRAST
TECHNIQUE: Multiplanar, multisequence MR imaging of the lumbar spine was
performed. No intravenous contrast was administered.

[Series 3: T2 · sagittal · 4.0mm · 1.17mm/px · 6 of 17 slices shown (1 of 2)]
[im 1/17]
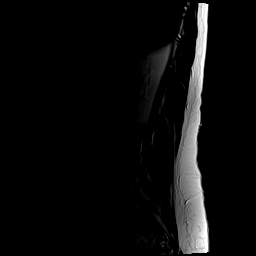
[im 4/17]
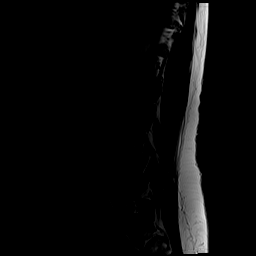
[im 7/17]
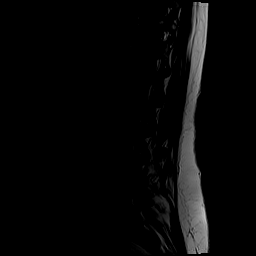
[im 10/17]
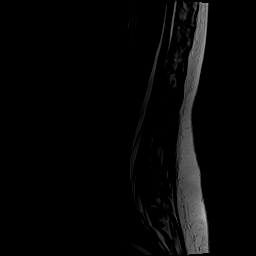
[im 13/17]
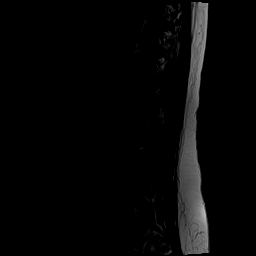
[im 17/17]
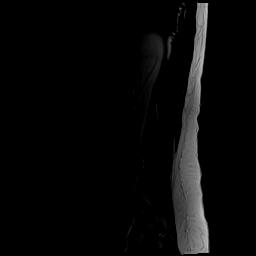

[Series 5: T1 · sagittal · 4.0mm · 1.17mm/px · 5 of 17 slices shown (1 of 2)]
[im 1/17]
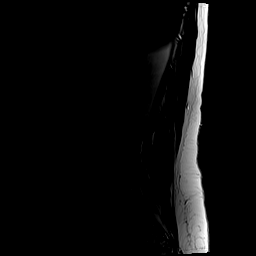
[im 5/17]
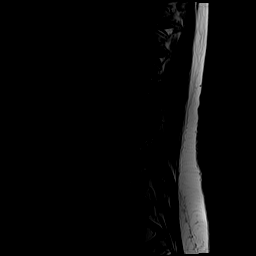
[im 9/17]
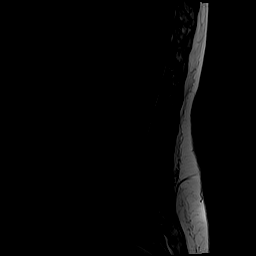
[im 13/17]
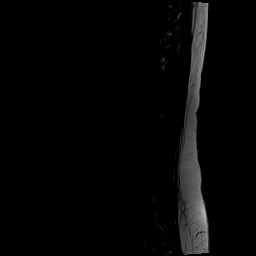
[im 17/17]
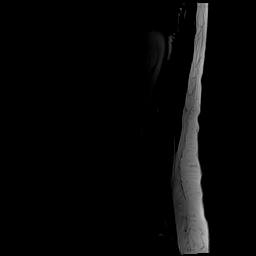

[Series 6: T2 · axial · 4.0mm · 0.39mm/px · z∈[-126,+96]mm · 10 of 49 slices shown (2 of 2)]
[im 4/49]
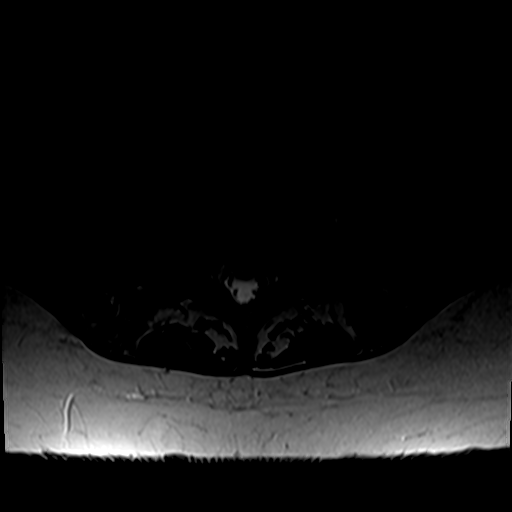
[im 7/49]
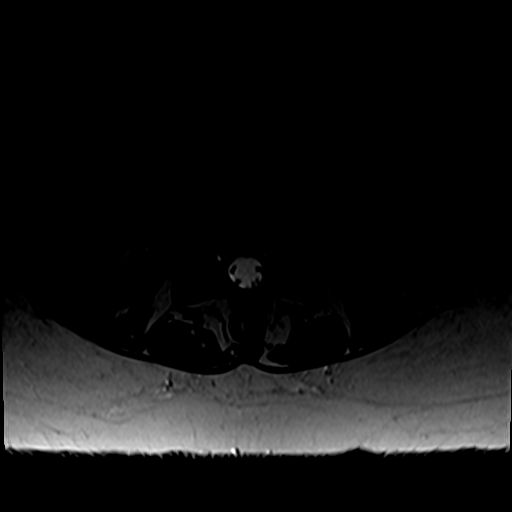
[im 10/49]
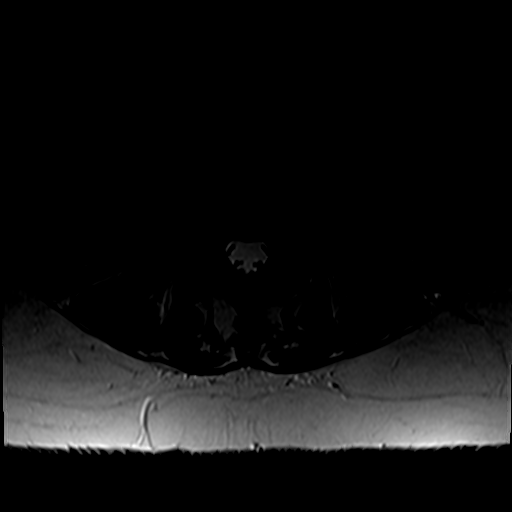
[im 17/49]
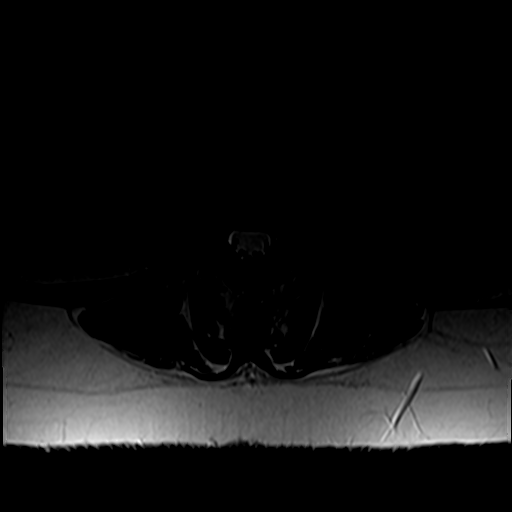
[im 23/49]
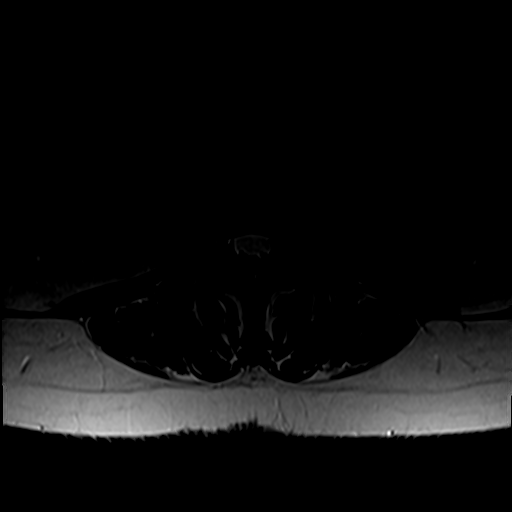
[im 26/49]
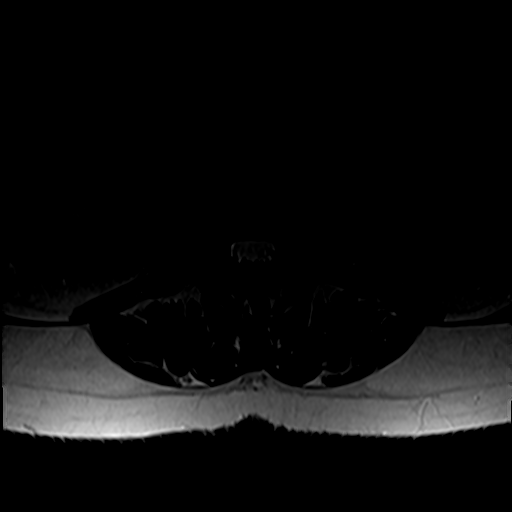
[im 29/49]
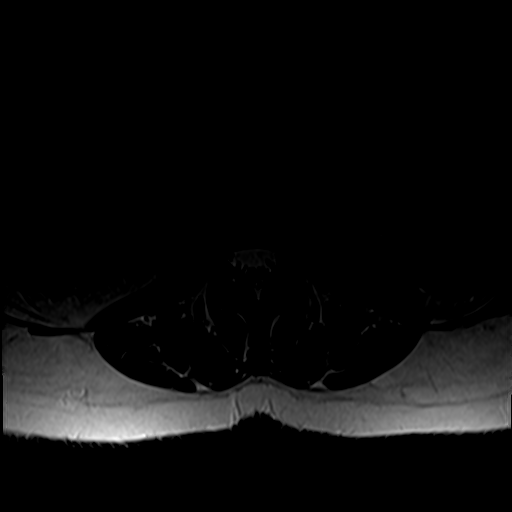
[im 36/49]
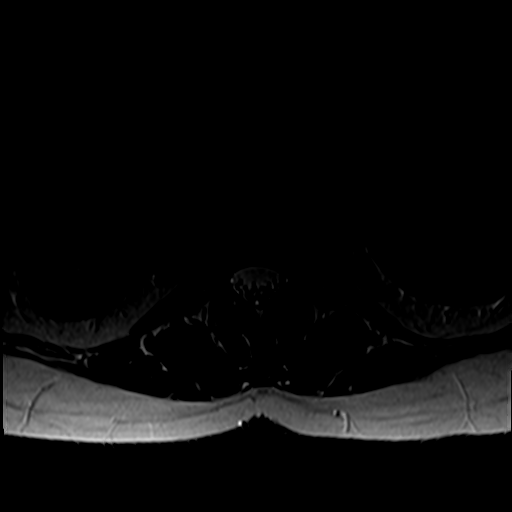
[im 42/49]
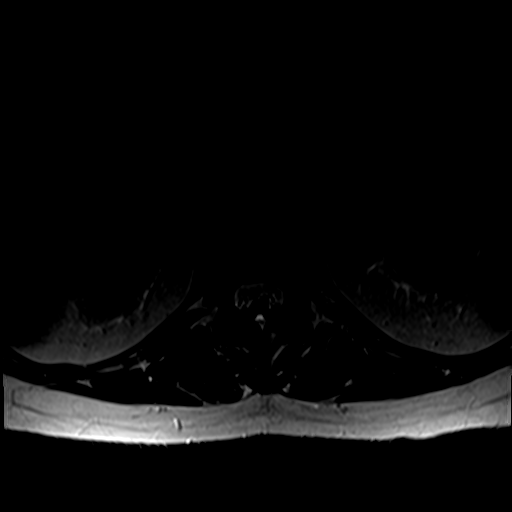
[im 49/49]
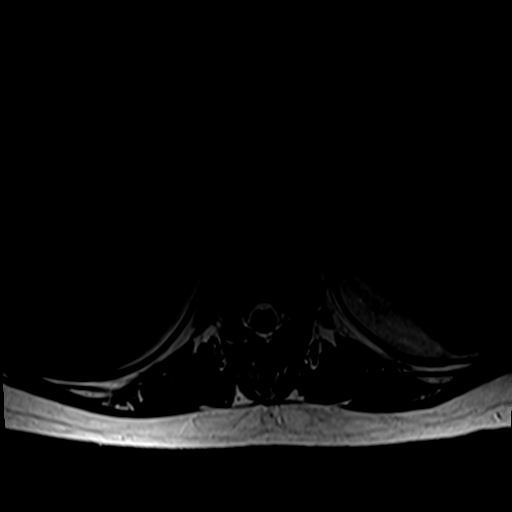

[Series 7: T1 · axial · 4.0mm · 0.39mm/px · z∈[-126,+63]mm · 5 of 49 slices shown (2 of 2)]
[im 4/49]
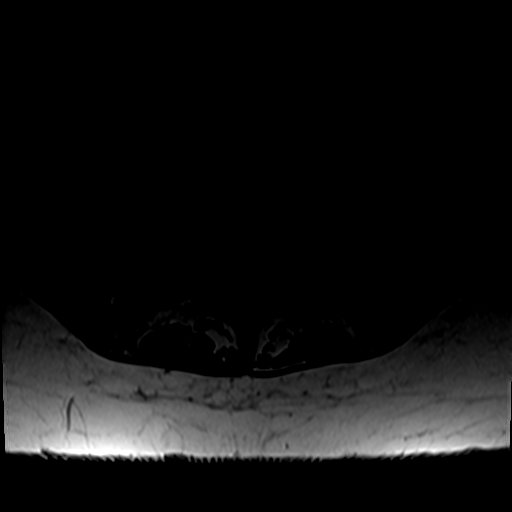
[im 7/49]
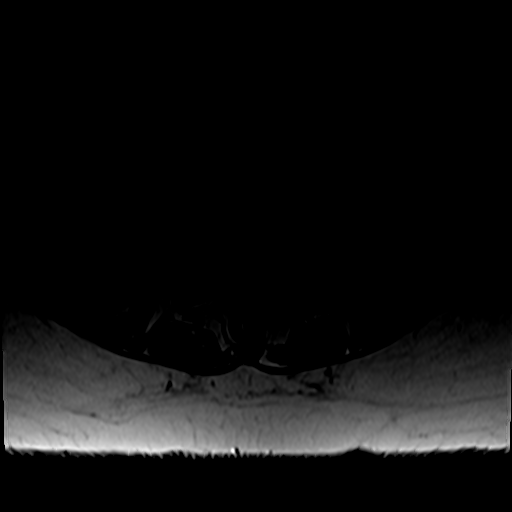
[im 10/49]
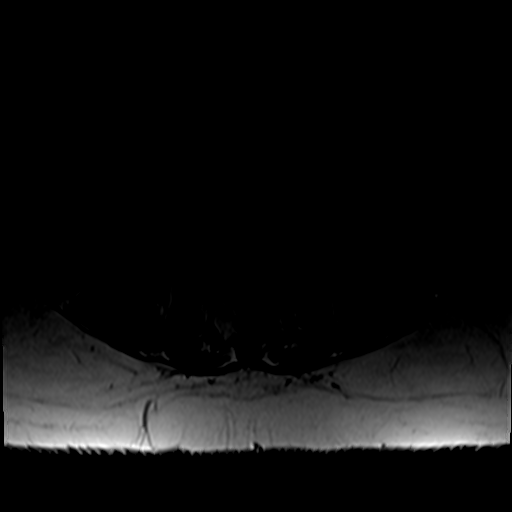
[im 26/49]
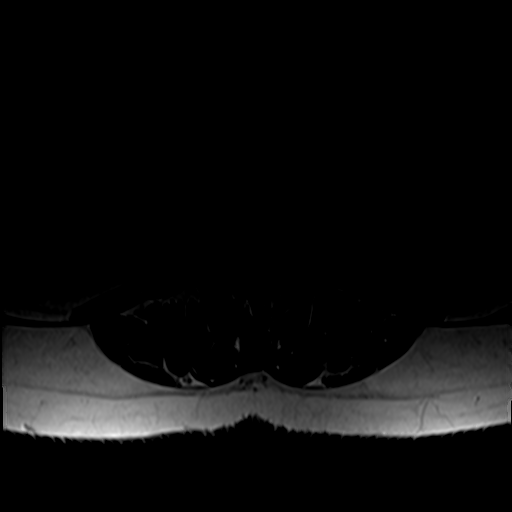
[im 42/49]
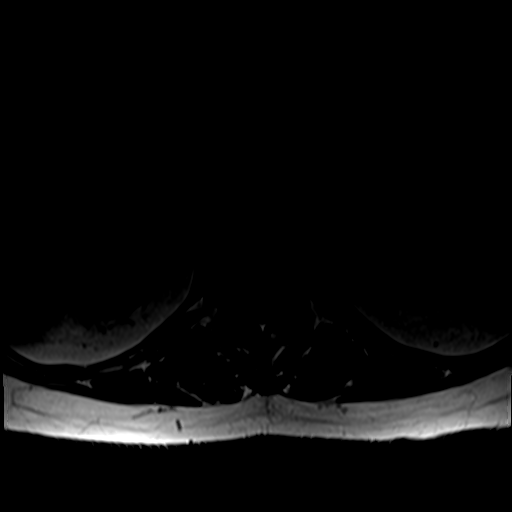

[26 of 48 positions shown; findings below may reference images not displayed]

FINDINGS: Segmentation: Standard; the lowest formed disc space is designated
L5-S1

Alignment:  Normal.

Vertebrae: Marrow signal is mildly heterogeneous without focal
suspicious abnormality. There is no suspicious marrow signal
abnormality or marrow edema.

Conus medullaris and cauda equina: Conus extends to the L1 level.
Conus and cauda equina appear normal.

Paraspinal and other soft tissues: T2 hyperintense lesions both
kidneys appear stable compared to the MRI from 02/05/2015.
Paraspinal soft tissues are unremarkable.

Disc levels:

The disc heights are overall preserved. There is mild multilevel
facet arthropathy, slightly more advanced at L5-S1.

There is a minimal disc bulge at L4-L5, without significant spinal
canal or neural foraminal stenosis. There is no significant spinal
canal or neural foraminal stenosis at the other levels. There is no
evidence of nerve root impingement.
IMPRESSION: Overall minimal degenerative changes in the lumbar spine as detailed
above, without significant spinal canal or neural foraminal
stenosis. No evidence of nerve root impingement.

## 2022-07-31 DIAGNOSIS — G459 Transient cerebral ischemic attack, unspecified: Secondary | ICD-10-CM

## 2022-07-31 HISTORY — DX: Transient cerebral ischemic attack, unspecified: G45.9

## 2022-08-30 ENCOUNTER — Encounter: Payer: Self-pay | Admitting: Obstetrics and Gynecology

## 2022-10-01 ENCOUNTER — Emergency Department (HOSPITAL_BASED_OUTPATIENT_CLINIC_OR_DEPARTMENT_OTHER): Payer: 59

## 2022-10-01 ENCOUNTER — Observation Stay (HOSPITAL_BASED_OUTPATIENT_CLINIC_OR_DEPARTMENT_OTHER)
Admission: EM | Admit: 2022-10-01 | Discharge: 2022-10-03 | Disposition: A | Discharge status: Home / Self Care | Payer: 59 | Attending: Family Medicine | Admitting: Family Medicine

## 2022-10-01 ENCOUNTER — Other Ambulatory Visit: Payer: Self-pay

## 2022-10-01 ENCOUNTER — Encounter (HOSPITAL_BASED_OUTPATIENT_CLINIC_OR_DEPARTMENT_OTHER): Payer: Self-pay | Admitting: Emergency Medicine

## 2022-10-01 DIAGNOSIS — G8191 Hemiplegia, unspecified affecting right dominant side: Secondary | ICD-10-CM

## 2022-10-01 DIAGNOSIS — G459 Transient cerebral ischemic attack, unspecified: Principal | ICD-10-CM | POA: Insufficient documentation

## 2022-10-01 DIAGNOSIS — I10 Essential (primary) hypertension: Secondary | ICD-10-CM | POA: Diagnosis not present

## 2022-10-01 DIAGNOSIS — R2990 Unspecified symptoms and signs involving the nervous system: Secondary | ICD-10-CM

## 2022-10-01 DIAGNOSIS — Z79899 Other long term (current) drug therapy: Secondary | ICD-10-CM | POA: Diagnosis not present

## 2022-10-01 DIAGNOSIS — I429 Cardiomyopathy, unspecified: Secondary | ICD-10-CM

## 2022-10-01 DIAGNOSIS — K219 Gastro-esophageal reflux disease without esophagitis: Secondary | ICD-10-CM | POA: Diagnosis present

## 2022-10-01 DIAGNOSIS — R200 Anesthesia of skin: Secondary | ICD-10-CM

## 2022-10-01 LAB — CBC
HCT: 42.2 % (ref 36.0–46.0)
Hemoglobin: 14.5 g/dL (ref 12.0–15.0)
MCH: 30 pg (ref 26.0–34.0)
MCHC: 34.4 g/dL (ref 30.0–36.0)
MCV: 87.4 fL (ref 80.0–100.0)
Platelets: 297 10*3/uL (ref 150–400)
RBC: 4.83 MIL/uL (ref 3.87–5.11)
RDW: 13.1 % (ref 11.5–15.5)
WBC: 8 10*3/uL (ref 4.0–10.5)
nRBC: 0 % (ref 0.0–0.2)

## 2022-10-01 LAB — COMPREHENSIVE METABOLIC PANEL
ALT: 46 U/L — ABNORMAL HIGH (ref 0–44)
AST: 32 U/L (ref 15–41)
Albumin: 4.8 g/dL (ref 3.5–5.0)
Alkaline Phosphatase: 89 U/L (ref 38–126)
Anion gap: 10 (ref 5–15)
BUN: 17 mg/dL (ref 8–23)
CO2: 26 mmol/L (ref 22–32)
Calcium: 10.9 mg/dL — ABNORMAL HIGH (ref 8.9–10.3)
Chloride: 105 mmol/L (ref 98–111)
Creatinine, Ser: 0.92 mg/dL (ref 0.44–1.00)
GFR, Estimated: >60 mL/min (ref >60)
Glucose, Bld: 119 mg/dL — ABNORMAL HIGH (ref 70–99)
Potassium: 3.6 mmol/L (ref 3.5–5.1)
Sodium: 141 mmol/L (ref 135–145)
Total Bilirubin: 0.4 mg/dL (ref 0.3–1.2)
Total Protein: 7.9 g/dL (ref 6.5–8.1)

## 2022-10-01 LAB — PROTIME-INR
INR: 1 (ref 0.8–1.2)
Prothrombin Time: 13.5 seconds (ref 11.4–15.2)

## 2022-10-01 LAB — DIFFERENTIAL
Abs Immature Granulocytes: 0.02 10*3/uL (ref 0.00–0.07)
Basophils Absolute: 0.1 10*3/uL (ref 0.0–0.1)
Basophils Relative: 1 %
Eosinophils Absolute: 0.3 10*3/uL (ref 0.0–0.5)
Eosinophils Relative: 4 %
Immature Granulocytes: 0 %
Lymphocytes Relative: 22 %
Lymphs Abs: 1.7 10*3/uL (ref 0.7–4.0)
Monocytes Absolute: 0.7 10*3/uL (ref 0.1–1.0)
Monocytes Relative: 9 %
Neutro Abs: 5.2 10*3/uL (ref 1.7–7.7)
Neutrophils Relative %: 64 %

## 2022-10-01 LAB — RAPID URINE DRUG SCREEN, HOSP PERFORMED
Amphetamines: NOT DETECTED
Barbiturates: NOT DETECTED
Benzodiazepines: NOT DETECTED
Cocaine: NOT DETECTED
Opiates: NOT DETECTED
Tetrahydrocannabinol: NOT DETECTED

## 2022-10-01 LAB — ETHANOL: Alcohol, Ethyl (B): <10 mg/dL (ref <10)

## 2022-10-01 LAB — CBG MONITORING, ED: Glucose-Capillary: 107 mg/dL — ABNORMAL HIGH (ref 70–99)

## 2022-10-01 LAB — APTT: aPTT: 26 seconds (ref 24–36)

## 2022-10-01 MED ORDER — IOHEXOL 350 MG/ML SOLN
75.0000 mL | Freq: Once | INTRAVENOUS | Status: AC | PRN
  Administered 2022-10-01: 75 mL via INTRAVENOUS

## 2022-10-01 NOTE — ED Provider Notes (Addendum)
[facility] Provider Note   CSN: [id number] Arrival date & time: 10/01/22  1633     History  Chief Complaint  Patient presents with   Numbness    Erika Bolton is a 67 y.o. female.  HPI    67 year old female comes in with chief complaint of right-sided numbness.  Patient has history of hypertension, left bundle branch block, cervical spine degenerative disease.  She states that she has chronic numbness to both of her legs because of her cervical spine disease.  At 230 p.m. today while she was at the gymnasium, she noted that her right lower extremity symptoms were more severe and that she was little unsteady when she was walking.  Once at home, at 3:30 PM she noted that the entire right side was numb.  She suspects that she was last normal at 2:30 PM.  Review of system is negative for any difficulty in swallowing, slurred speech, one-sided weakness, vision change.   Home Medications Prior to Admission medications   Medication Sig Start Date End Date Taking? Authorizing Provider  amLODipine (NORVASC) 5 MG tablet Take 5 mg by mouth daily. 09/15/21   [provider]  Calcium Carbonate (CALTRATE 600 PO) Take by mouth.    [provider]  carboxymethylcellulose (REFRESH PLUS) 0.5 % SOLN Apply to eye.    [provider]  cholecalciferol (VITAMIN D) 1000 UNITS tablet Take 2,000 Units by mouth daily.    [provider]  COVID-19 mRNA vaccine [id number] (COMIRNATY) syringe Inject into the muscle. 05/23/22   Carlyle Basques, MD  Estradiol 10 MCG TABS vaginal tablet Place 1 tablet (10 mcg total) vaginally 2 (two) times a week. 03/23/22   Salvadore Dom, MD  Magnesium Citrate 125 MG CAPS Take 150 mg by mouth See admin instructions. 150 mg    [provider]  metroNIDAZOLE (METROGEL) 0.75 % gel metronidazole 0.75 % topical gel  APPLY TO AFFECTED AREA EVERY DAY    [provider]  Multiple  Vitamins-Minerals (WOMENS MULTIVITAMIN PLUS PO) Take 1 tablet by mouth daily.    [provider]  Omega 3 1200 MG CAPS Take 2 capsules by mouth daily.    [provider]  omeprazole (PRILOSEC) 20 MG capsule Take by mouth daily. 10/23/17   [provider]  tretinoin (RETIN-A) 0.025 % cream SMARTSIG:sparingly Topical Every Evening 06/30/21   [provider]      Allergies    Other, Penicillins, Gold sodium thiosulfate, and Nickel    Review of Systems   Review of Systems  All other systems reviewed and are negative.   Physical Exam Updated Vital Signs BP (!) 155/83   Pulse 97   Resp 20   Ht 5' 4.5" (1.638 m)   Wt 77.4 kg   LMP 08/01/2007 (Approximate)   SpO2 98%   BMI 28.84 kg/m  Physical Exam Vitals and nursing note reviewed.  Constitutional:      Appearance: She is well-developed.  HENT:     Head: Atraumatic.  Eyes:     Extraocular Movements: Extraocular movements intact.     Pupils: Pupils are equal, round, and reactive to light.  Cardiovascular:     Rate and Rhythm: Normal rate.  Pulmonary:     Effort: Pulmonary effort is normal.  Musculoskeletal:     Cervical back: Normal range of motion and neck supple.  Skin:    General: Skin is warm and dry.  Neurological:  Mental Status: She is alert and oriented to person, place, and time.     Cranial Nerves: No cranial nerve deficit.     Sensory: Sensory deficit present.     Motor: No weakness.     Coordination: Coordination normal.     Gait: Gait normal.     Deep Tendon Reflexes: Reflexes normal.     ED Results / Procedures / Treatments   Labs (all labs ordered are listed, but only abnormal results are displayed) Labs Reviewed  COMPREHENSIVE METABOLIC PANEL - Abnormal; Notable for the following components:      Result Value   Glucose, Bld 119 (*)    Calcium 10.9 (*)    ALT 46 (*)    All other components within normal limits  CBG MONITORING, ED - Abnormal; Notable for the  following components:   Glucose-Capillary 107 (*)    All other components within normal limits  ETHANOL  PROTIME-INR  APTT  CBC  DIFFERENTIAL  RAPID URINE DRUG SCREEN, HOSP PERFORMED    EKG EKG Interpretation  Date/Time:  Sunday October 01 2022 16:44:07 EST Ventricular Rate:  108 PR Interval:  153 QRS Duration: 166 QT Interval:  379 QTC Calculation: 508 R Axis:   -43 Text Interpretation: Sinus tachycardia Left bundle branch block No acute changes No significant change since last tracing Confirmed by Varney Biles [phone]) on 10/01/2022 4:52:20 PM  Radiology CT HEAD CODE STROKE WO CONTRAST  Result Date: 10/01/2022 CLINICAL DATA:  Code stroke.  Right facial numbness EXAM: CT HEAD WITHOUT CONTRAST TECHNIQUE: Contiguous axial images were obtained from the base of the skull through the vertex without intravenous contrast. RADIATION DOSE REDUCTION: This exam was performed according to the departmental dose-optimization program which includes automated exposure control, adjustment of the mA and/or kV according to patient size and/or use of iterative reconstruction technique. COMPARISON:  Brain MRI 05/10/2007 FINDINGS: Brain: There is no acute intracranial hemorrhage, extra-axial fluid collection, or acute infarct. Parenchymal volume is normal. The ventricles are normal in size. Gray-white differentiation is preserved. 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 A partially empty sella is noted, nonspecific. There is no solid mass lesion. There is no mass effect or midline shift. Vascular: No dense vessel is seen. Skull: Normal. Negative for fracture or focal lesion. Sinuses/Orbits: The imaged paranasal sinuses are clear. Bilateral lens implants are in place. The globes and orbits are otherwise unremarkable. ASPECTS [facility] Stroke Program Early CT Score) - Ganglionic level infarction (caudate, lentiform nuclei, internal capsule, insula, M1-M3 cortex): 7 - Supraganglionic  infarction (M4-M6 cortex): 3 Total score (0-10 with 10 being normal): 10 IMPRESSION: No acute intracranial pathology. Electronically Signed   By: Valetta Mole M.D.   On: 10/01/2022 17:07    Procedures .Critical Care  Performed by: Varney Biles, MD Authorized by: Varney Biles, MD   Critical care provider statement:    Critical care time (minutes):  32   Critical care was necessary to treat or prevent imminent or life-threatening deterioration of the following conditions:  CNS failure or compromise   Critical care was time spent personally by me on the following activities:  Development of treatment plan with patient or surrogate, discussions with consultants, evaluation of patient's response to treatment, examination of patient, ordering and review of laboratory studies, ordering and review of radiographic studies, ordering and performing treatments and interventions, pulse oximetry, re-evaluation of patient's condition and review of old charts     Medications Ordered in ED Medications  iohexol (OMNIPAQUE)  350 MG/ML injection 75 mL (75 mLs Intravenous Contrast Given 10/01/22 1814)    ED Course/ Medical Decision Making/ A&P                             Medical Decision Making Amount and/or Complexity of Data Reviewed Labs: ordered. Radiology: ordered.  Risk Prescription drug management.   This patient presents to the ED with chief complaint(s) of right-sided hemiparesis with pertinent past medical history of hypertension, chronic numbness in both of her legs because of cervical spine degenerative disease.The complaint involves an extensive differential diagnosis and also carries with it a high risk of complications and morbidity.    The differential diagnosis includes : Acute ischemic stroke, high-grade cervical spine stenosis in the upper cervical spine, myelopathy, early GBS, severe electrolyte abnormality  The initial plan is to activate code stroke as patient has presented  within 3 hours of acute neurologic change that could be due to stroke.   Additional history obtained: Records reviewed  previous imaging from 09-2021.  Patient was found to have significant neuroforaminal stenosis on the left side over C5, C6 and C6, C7.  Independent labs interpretation:  The following labs were independently interpreted: Patient's labs are all reassuring.  No evidence of severe electrolyte abnormality  Independent visualization and interpretation of imaging: - I independently visualized the following imaging with scope of interpretation limited to determining acute life threatening conditions related to emergency care: CT scan of the brain, which revealed no evidence of brain bleed.  Treatment and Reassessment: Symptoms are unchanged at 6 PM.  Consultation: - Consulted or discussed management/test interpretation with external professional:  Neurology team was consulted as patient is well within the TNK window.  Symptoms are not disabling. Neurology team is independently assessed the patient.  Recommendation is to not proceed with TNK.  They had shared decision-making with the patient.  They recommend neurochecks every 30 minutes in the ER and transfer thereafter to [facility] for MRI of the brain and cervical spine.  If the MRI of the cervical spine is negative for acute process that can explain the symptoms -then patient will need admission to the hospital even if the MRI brain is negative.  This recommendation has been discussed with the patient. Transfer for 8 PM has been arranged.  Dr. Darl Householder at [facility] excepting.   Final Clinical Impression(s) / ED Diagnoses Final diagnoses:  Hemiparesis of right dominant side, unspecified hemiparesis etiology [facility])    Rx / DC Orders ED Discharge Orders     None         Varney Biles, MD 10/01/22 Virl Cagey    Varney Biles, MD 10/01/22 1941

## 2022-10-01 NOTE — ED Provider Notes (Addendum)
  Physical Exam  BP (!) 144/71 (BP Location: Right Arm)   Pulse 83   Temp 98.1 F (36.7 C) (Oral)   Resp 17   Ht 5' 4.5" (1.638 m)   Wt 77.4 kg   LMP 08/01/2007 (Approximate)   SpO2 98%   BMI 28.84 kg/m   Physical Exam  Procedures  Procedures  ED Course / MDM    Medical Decision Making Patient is transferred here from [place] for MRI and admission.  Patient's symptom has resolved.  She is outside the 4-hour tPA window.  MRI brain and cervical spine is pending.  However neurology would like patient be admitted regardless of the result for TIA workup.  Consult to hospitalist to admit.  9:46 PM Dr. Marlowe Sax called back. Request that we get MRI first before admission. If MRI showed cervical cause of her numbness, then she will need neurosurgery consult.   11:35 PM MRI pending. Signed out to Dr. Randal Buba in the ED   Amount and/or Complexity of Data Reviewed Labs: ordered. Decision-making details documented in ED Course. Radiology: ordered and independent interpretation performed. Decision-making details documented in ED Course.  Risk Prescription drug management. Decision regarding hospitalization.          Drenda Freeze, MD 10/01/22 2036    Drenda Freeze, MD 10/01/22 [phone]

## 2022-10-01 NOTE — ED Triage Notes (Addendum)
Pt via pov from home with numbness to her right side. She reports she has a hx of numbness in both legs but that after working out she felt numbness from her feet to her head. Pt also reports that she has felt off balance for the last hour. LKW aprox 1430 hours No deficits noted during triage. NAD noted.

## 2022-10-01 NOTE — ED Triage Notes (Signed)
Pt from [facility] for an MRI all symptoms she had previous are resolved. Back to baseline which is numbness in both legs most of the time

## 2022-10-01 NOTE — ED Notes (Signed)
Called [facility] -- set up patient transport to [facility] ED for MRI @ 20:00; accepting -- Shirlyn Goltz, MD

## 2022-10-01 NOTE — Consult Note (Signed)
[facility] Telemedicine Consult   Requesting Provider: Varney Biles Consult Participants: Myself, bedside nurse Verdis Frederickson, patient, atrium nurse Location of the provider: [facility] Location of the patient: [facility]  This consult was provided via telemedicine with 2-way video and audio communication. The patient/family was informed that care would be provided in this way and agreed to receive care in this manner.   CC: Right sided numbness and heaviness   History is obtained from: Patient and chart review   HPI: Erika Bolton is a 67 y.o. female with a past medical history significant for cervical spine stenosis, left bundle branch block, hypertension, GERD, MGUS, cervical spine stenosis, fatty liver  She was in her usual state of health today, with intermittent bilateral leg numbness aggravated by standing that has been ongoing for the past approximately 1 year (had nearly resolved but then recurrent again in the past 2 months or so).  She did note that this bilateral leg numbness did feel somewhat worse after she walked 2 miles at the gym.  However on returning home at 3:30 PM she noted right face arm and leg numbness that she has not ever had before   LKW: 3:30 PM Thrombolytic given?: No, too mild to treat  Checklist of contraindications was reviewed and negative. Risks, benefits and alternatives were discussed, and patient elected that she would want treatment if her symptoms worsen within the treatment window (8 PM) IA performed?: No, exam not consistent with LVO Premorbid modified rankin scale:      1 - No significant disability. Able to carry out all usual activities, despite some symptoms.  ROS: All other review of systems was negative except as noted in the HPI.   Time of teleneurologist evaluation: 4:58 PM  Exam: Vitals:   10/01/22 1700 10/01/22 1715  BP: (!) 145/71 (!) 155/83  Pulse: 97 97  Resp: 15 20  SpO2: 98% 98%    General: No  acute distress Pulmonary: breathing comfortably Cardiac: regular rate and rhythm on monitor   NIH Stroke scale 1A: Level of Consciousness - 0 1B: Ask Month and Age - 0 1C: 'Blink Eyes' & 'Squeeze Hands' - 0 2: Test Horizontal Extraocular Movements - 0 3: Test Visual Fields - 0 4: Test Facial Palsy - 0 5A: Test Left Arm Motor Drift - 0 5B: Test Right Arm Motor Drift - 0, no finger curling noted 6A: Test Left Leg Motor Drift - 0 6B: Test Right Leg Motor Drift - 0 7: Test Limb Ataxia - 0 8: Test Sensation - 1 9: Test Language/Aphasia- 0 10: Test Dysarthria - 0 11: Test Extinction/Inattention - 0 NIHSS score: 1   Imaging Reviewed:   Head CT personally reviewed, agree with radiology no acute intracranial process  Labs reviewed in epic and pertinent values follow:  Basic Metabolic Panel: No results for input(s): "NA", "K", "CL", "CO2", "GLUCOSE", "BUN", "CREATININE", "CALCIUM", "MG", "PHOS" in the last 168 hours.  CBC: Recent Labs  Lab 10/01/22 1700  WBC 8.0  NEUTROABS 5.2  HGB 14.5  HCT 42.2  MCV 87.4  PLT 297    Coagulation Studies: Recent Labs    10/01/22 1700  LABPROT 13.5  INR 1.0     Assessment: Right sided sensory symptoms, potentially referable to cervical spine stenosis with referral into the face versus small lacunar stroke.  Her symptoms are very mild and have not been fluctuating (perhaps some slight improvement since onset only without fluctuation).  In this setting, patient would prefer to  avoid treatment due to risks of TNK unless she worsens  Recommendations:  -Every 30 minute neurochecks, with reactivation of code stroke for worsening symptoms -CTA head and neck to be completed at [facility] once renal function is confirmed to be adequate (not yet ordered as creatinine is still pending at this time) -Once the TNK window has passed (8 PM), patient should be referred to [facility] for MRI brain and cervical spine -If cervical spine shows  significant new or worsening process, further evaluation of that process is indicated (for example neurosurgical consultation if there is concern for an acute cord compression)  If MRI cervical spine is negative, and patient does not worsen requiring TNK treatment within the window -admit for stroke/TIA workup (A1c, lipid panel, echocardiogram, telemetry) -Continue home aspirin 81 mg daily for now,  -consider Plavix 300 mg loading dose followed by 75 mg daily (course to be determined based on vessel imaging) -Permissive hypertension for now to 220/110 (to be amended if TNK is given) -Stroke team to follow in consultation   This patient is receiving care for possible acute neurological changes. There was 48 minutes of care by this provider at the time of service, including time for direct evaluation via telemedicine, review of medical records, imaging studies and discussion of findings with providers, the patient and/or family.  Recommendations conveyed to Dr. Kathrynn Humble via secure chat  Lesleigh Noe MD-PhD [facility] [phone]   If 8pm-8am, please page neurology on call as listed in [facility].  CRITICAL CARE Performed by: Lorenza Chick   Total critical care time: 48 minutes  Critical care time was exclusive of separately billable procedures and treating other patients.  Critical care was necessary to treat or prevent imminent or life-threatening deterioration, emergent evaluation for consideration of thrombolytic or thrombectomy.  Critical care was time spent personally by me on the following activities: development of treatment plan with patient and/or surrogate as well as nursing, discussions with consultants, evaluation of patient's response to treatment, examination of patient, obtaining history from patient or surrogate, ordering and performing treatments and interventions, ordering and review of laboratory studies, ordering and review of radiographic studies, pulse  oximetry and re-evaluation of patient's condition.

## 2022-10-01 NOTE — ED Notes (Signed)
NIH 0 Dr. Kathrynn Humble at bedside

## 2022-10-01 NOTE — Progress Notes (Signed)
Telestroke Note   1648: Code stroke cart activated at this time. EDP on camera finishing patient assessment at this time. FANG-D negative exam. Running code stroke per EDP. LKW 1530. mRS 1. NIH 1.   1652: Patient left for CT at this time.   1655: Paged Dr. Curly Shores at this time.   1657: Dr. Curly Shores on camera at this time. Patient report and history provided to Dr. Curly Shores on camera at this time.   1658: Patient returned from CT at this time.   1700: Dr. Curly Shores performing neuro assessment at this time.      Berenice Bouton Telestroke RN

## 2022-10-02 ENCOUNTER — Observation Stay (HOSPITAL_BASED_OUTPATIENT_CLINIC_OR_DEPARTMENT_OTHER): Payer: 59

## 2022-10-02 ENCOUNTER — Emergency Department (HOSPITAL_COMMUNITY): Payer: 59

## 2022-10-02 ENCOUNTER — Encounter (HOSPITAL_COMMUNITY): Payer: Self-pay | Admitting: Internal Medicine

## 2022-10-02 DIAGNOSIS — I10 Essential (primary) hypertension: Secondary | ICD-10-CM

## 2022-10-02 DIAGNOSIS — I255 Ischemic cardiomyopathy: Secondary | ICD-10-CM

## 2022-10-02 DIAGNOSIS — I447 Left bundle-branch block, unspecified: Secondary | ICD-10-CM | POA: Diagnosis not present

## 2022-10-02 DIAGNOSIS — I429 Cardiomyopathy, unspecified: Secondary | ICD-10-CM

## 2022-10-02 DIAGNOSIS — G459 Transient cerebral ischemic attack, unspecified: Secondary | ICD-10-CM

## 2022-10-02 DIAGNOSIS — E785 Hyperlipidemia, unspecified: Secondary | ICD-10-CM

## 2022-10-02 DIAGNOSIS — R200 Anesthesia of skin: Secondary | ICD-10-CM | POA: Diagnosis not present

## 2022-10-02 LAB — ECHOCARDIOGRAM COMPLETE
AR max vel: 3.18 cm2
AV Area VTI: 3.12 cm2
AV Area mean vel: 3.12 cm2
AV Mean grad: 3 mmHg
AV Peak grad: 5.1 mmHg
Ao pk vel: 1.13 m/s
Area-P 1/2: 3.91 cm2
Calc EF: 51.4 %
Est EF: 35
Height: 64.5 in
S' Lateral: 3.7 cm
Single Plane A2C EF: 54.6 %
Single Plane A4C EF: 44.5 %
Weight: 2730.18 oz

## 2022-10-02 LAB — LIPID PANEL
Cholesterol: 206 mg/dL — ABNORMAL HIGH (ref 0–200)
HDL: 68 mg/dL (ref >40)
LDL Cholesterol: 124 mg/dL — ABNORMAL HIGH (ref 0–99)
Total CHOL/HDL Ratio: 3 RATIO
Triglycerides: 72 mg/dL (ref <150)
VLDL: 14 mg/dL (ref 0–40)

## 2022-10-02 LAB — HIV ANTIBODY (ROUTINE TESTING W REFLEX): HIV Screen 4th Generation wRfx: NONREACTIVE

## 2022-10-02 MED ORDER — CLOPIDOGREL BISULFATE 75 MG PO TABS
75.0000 mg | ORAL_TABLET | Freq: Every day | ORAL | Status: DC
  Administered 2022-10-02 – 2022-10-03 (×2): 75 mg via ORAL
  Filled 2022-10-02 (×2): qty 1

## 2022-10-02 MED ORDER — ACETAMINOPHEN 325 MG PO TABS: 650.0000 mg | ORAL_TABLET | Freq: Four times a day (QID) | ORAL | Status: DC | PRN

## 2022-10-02 MED ORDER — ENOXAPARIN SODIUM 40 MG/0.4ML IJ SOSY
40.0000 mg | PREFILLED_SYRINGE | INTRAMUSCULAR | Status: DC
  Administered 2022-10-02: 40 mg via SUBCUTANEOUS
  Filled 2022-10-02: qty 0.4

## 2022-10-02 MED ORDER — METOPROLOL SUCCINATE ER 25 MG PO TB24
25.0000 mg | ORAL_TABLET | Freq: Every day | ORAL | Status: DC
  Administered 2022-10-02 – 2022-10-03 (×2): 25 mg via ORAL
  Filled 2022-10-02 (×2): qty 1

## 2022-10-02 MED ORDER — ASPIRIN 81 MG PO TBEC
81.0000 mg | DELAYED_RELEASE_TABLET | Freq: Every day | ORAL | Status: DC
  Administered 2022-10-02 – 2022-10-03 (×2): 81 mg via ORAL
  Filled 2022-10-02 (×2): qty 1

## 2022-10-02 MED ORDER — ROSUVASTATIN CALCIUM 20 MG PO TABS
20.0000 mg | ORAL_TABLET | Freq: Every day | ORAL | Status: DC
  Administered 2022-10-02 – 2022-10-03 (×2): 20 mg via ORAL
  Filled 2022-10-02 (×2): qty 1

## 2022-10-02 MED ORDER — STROKE: EARLY STAGES OF RECOVERY BOOK
Freq: Once | Status: DC
  Filled 2022-10-02: qty 1

## 2022-10-02 MED ORDER — FAMOTIDINE 20 MG PO TABS
40.0000 mg | ORAL_TABLET | Freq: Every day | ORAL | Status: DC
  Administered 2022-10-02 – 2022-10-03 (×2): 40 mg via ORAL
  Filled 2022-10-02 (×2): qty 2

## 2022-10-02 NOTE — Discharge Summary (Signed)
Physician Discharge Summary   Patient: Erika Bolton MRN: [id number] DOB: 28-Jan-1956  Admit date:     10/01/2022  Discharge date: {dischdate:26783}  Discharge Physician: Cordelia Poche, MD   PCP: Lavone Orn, MD   Recommendations at discharge:  [facility] follow-up with PCP Cardiology follow-up *** Hemoglobin A1C pending on discharge  Discharge Diagnoses: Principal Problem:   TIA (transient ischemic attack) Active Problems:   GERD (gastroesophageal reflux disease)   HTN (hypertension)  Resolved Problems:   * No resolved hospital problems. [facility] Course: No notes on file  Assessment and Plan:  TIA*** CT head significant for no acute intracranial pathology. MRI confirms no acute stroke/intracranial abnormality. CTA head and neck significant for no large vessel occlusion. LDL of 124. Hemoglobin A1C pending at time of discharge***. Transthoracic Echocardiogram significant for LVEF of 35% with associated grade 1 diastolic dysfunction. Neurology recommendations for ***. PT/OT recommendations for no follow-up. Patient discharged on ***. Recommendation for Neurology follow-up in 4 weeks.  LBBB Known. History of left heart catheterization in 2012 which was unremarkable and significant for normal LVEF.  Cardiomyopathy Newly diagnosed. LVEF of 35% on Transthoracic Echocardiogram. Cardiology consulted. ***   Consultants: Neurology, Cardiology Procedures performed: Transthoracic Echocardiogram, ***  Disposition: Home Diet recommendation: Cardiac diet   DISCHARGE MEDICATION: Allergies as of 10/02/2022       Reactions   Other Other (See Comments)   Potassium dichromate, broke out in dry rashes   Penicillins Swelling   Throat swelling - hasn't had since was a teen   Gold Sodium Thiosulfate Rash   Nickel Rash     Med Rec must be completed prior to using this [place]***       Follow-up Information     Sater, Nanine Means, MD. Schedule an appointment as soon as  possible for a visit in 1 month(s).   Specialty: Neurology Contact information: [place] [address] [phone]                Discharge Exam: BP 133/60 (BP Location: Right Arm)   Pulse 79   Temp 98.2 F (36.8 C) (Oral)   Resp 15   Ht 5' 4.5" (1.638 m)   Wt 77.4 kg   LMP 08/01/2007 (Approximate)   SpO2 97%   BMI 28.84 kg/m   General exam: Appears calm and comfortable Respiratory system: Clear to auscultation. Respiratory effort normal. Cardiovascular system: S1 & S2 heard, RRR. No murmurs, rubs, gallops or clicks. Gastrointestinal system: Abdomen is nondistended, soft and nontender. No organomegaly or masses felt. Normal bowel sounds heard. Central nervous system: Alert and oriented. No focal neurological deficits. Musculoskeletal: No edema. No calf tenderness Skin: No cyanosis. No rashes Psychiatry: Judgement and insight appear normal. Mood & affect appropriate.   Condition at discharge: stable  The results of significant diagnostics from this hospitalization (including imaging, microbiology, ancillary and laboratory) are listed below for reference.   Imaging Studies: MR Cervical Spine Wo Contrast  Result Date: 10/02/2022 CLINICAL DATA:  Initial evaluation for acute myelopathy. EXAM: MRI CERVICAL SPINE WITHOUT CONTRAST TECHNIQUE: Multiplanar, multisequence MR imaging of the cervical spine was performed. No intravenous contrast was administered. COMPARISON:  Prior MRI from 09/07/2021. FINDINGS: Alignment: Straightening with slight reversal of the normal cervical lordosis. Trace degenerative retrolisthesis of C5 on C6. Vertebrae: Vertebral body height maintained without acute or chronic fracture. Bone marrow signal intensity within normal limits. Few subcentimeter benign hemangiomata noted. No worrisome osseous lesions. No abnormal marrow edema. Cord: Normal signal and morphology.  Posterior Fossa, vertebral arteries, paraspinal tissues: Unremarkable. Disc  levels: C2-C3: Small central disc protrusion with annular fissure indents the ventral thecal sac. No spinal stenosis or cord impingement. Foramina remain patent. C3-C4: Small central disc protrusion with annular fissure indents the ventral thecal sac. No significant spinal stenosis or cord impingement. Foramina remain patent. C4-C5: Disc desiccation with mild disc bulge and uncovertebral spurring. Superimposed small central disc protrusion with annular fissure indents the ventral thecal sac (series 7, image 22). Minimal cord flattening without cord signal changes. Mild spinal stenosis. Foramina remain patent. C5-C6: Trace retrolisthesis. Degenerative intervertebral disc space narrowing with diffuse disc osteophyte complex. Broad posterior component flattens and effaces the ventral thecal sac. Mild cord flattening without cord signal changes. Moderate to severe spinal stenosis with the thecal sac measuring 7 mm in AP diameter. Foramina remain patent. C6-C7: Right paracentral disc extrusion with superior migration (series 7, image 30). For disc material contacts and minimally flattens the ventral cord. No cord signal changes. Moderate spinal stenosis. Foramina remain patent. C7-T1:  Unremarkable. IMPRESSION: 1. Normal MRI appearance of the cervical spinal cord. No cord signal changes to suggest myelopathy. No other acute abnormality. 2. Degenerative disc osteophyte at C5-6 with resultant moderate to severe spinal stenosis. 3. Right paracentral disc extrusion with superior migration at C6-7 with resultant moderate spinal stenosis. 4. Small central disc protrusion at C4-5 with resultant mild spinal stenosis. Electronically Signed   By: Jeannine Boga M.D.   On: 10/02/2022 01:44   MR BRAIN WO CONTRAST  Result Date: 10/02/2022 CLINICAL DATA:  Initial evaluation for neuro deficit, stroke suspected. EXAM: MRI HEAD WITHOUT CONTRAST TECHNIQUE: Multiplanar, multiecho pulse sequences of the brain and surrounding  structures were obtained without intravenous contrast. COMPARISON:  Prior CTs from 10/01/2022. FINDINGS: Brain: Cerebral volume within normal limits. Minimal scattered chronic microvascular ischemic disease noted involving the supratentorial cerebral white matter. Small remote left cerebellar infarct. No evidence for acute or subacute ischemia. Gray-white matter differentiation maintained. Orders of chronic cortical infarction. No acute intracranial hemorrhage. Single punctate chronic microhemorrhage noted within the right thalamus. No other chronic intracranial blood products. No mass lesion, midline shift or mass effect. No hydrocephalus or extra-axial fluid collection. Pituitary gland and suprasellar region within normal limits. Vascular: Major intracranial vascular flow voids are maintained. Skull and upper cervical spine: Craniocervical junction normal. Bone marrow signal intensity within normal limits. No scalp soft tissue abnormality. Sinuses/Orbits: Prior bilateral ocular lens replacement. Paranasal sinuses are clear. No mastoid effusion. Other: None. IMPRESSION: 1. No acute intracranial abnormality. 2. Small remote left cerebellar infarct. 3. Underlying mild chronic microvascular ischemic disease for age. Electronically Signed   By: Jeannine Boga M.D.   On: 10/02/2022 01:38   CT Angio Head Neck W WO CM  Result Date: 10/01/2022 CLINICAL DATA:  Initial evaluation for right-sided hemiparesis., which EXAM: CT ANGIOGRAPHY HEAD AND NECK TECHNIQUE: Multidetector CT imaging of the head and neck was performed using the standard protocol during bolus administration of intravenous contrast. Multiplanar CT image reconstructions and MIPs were obtained to evaluate the vascular anatomy. Carotid stenosis measurements (when applicable) are obtained utilizing NASCET criteria, using the distal internal carotid diameter as the denominator. RADIATION DOSE REDUCTION: This exam was performed according to the  departmental dose-optimization program which includes automated exposure control, adjustment of the mA and/or kV according to patient size and/or use of iterative reconstruction technique. CONTRAST:  25m OMNIPAQUE IOHEXOL 350 MG/ML SOLN COMPARISON:  Prior head CT from earlier the same day. FINDINGS: CTA NECK FINDINGS Aortic  arch: Visualized aortic arch normal caliber with standard 3 vessel morphology. No stenosis about the origin of the great vessels. Right carotid system: Right common and internal carotid arteries widely patent without stenosis, dissection or occlusion. Left carotid system: Left common and internal carotid arteries widely patent without stenosis, dissection or occlusion. Vertebral arteries: Both vertebral arteries arise from the subclavian arteries. No proximal subclavian artery stenosis. Both vertebral arteries widely patent without stenosis, dissection or occlusion. Skeleton: No discrete or worrisome osseous lesions. Moderate spondylosis noted at C5-6. Other neck: No other acute soft tissue abnormality within the neck. Upper chest: Visualized upper chest demonstrates no acute finding. Review of the MIP images confirms the above findings CTA HEAD FINDINGS Anterior circulation: Both internal carotid arteries widely patent to the termini without stenosis. A1 segments widely patent. Normal anterior communicating artery complex. Both anterior cerebral arteries widely patent to their distal aspects without stenosis. No M1 stenosis or occlusion. Normal MCA bifurcations. Distal MCA branches well perfused and symmetric. Posterior circulation: Both V4 segments patent to the vertebrobasilar junction without stenosis. Both PICA origins patent and normal. Basilar widely patent to its distal aspect without stenosis. Superior cerebellar arteries patent bilaterally. Both PCAs primarily supplied via the basilar and are well perfused to there distal aspects. Venous sinuses: Patent allowing for timing the contrast  bolus. Anatomic variants: None significant.  No aneurysm. Review of the MIP images confirms the above findings IMPRESSION: Normal CTA of the head and neck. No large vessel occlusion or other emergent finding. No hemodynamically significant or correctable stenosis. Electronically Signed   By: Jeannine Boga M.D.   On: 10/01/2022 20:11   CT HEAD CODE STROKE WO CONTRAST  Result Date: 10/01/2022 CLINICAL DATA:  Code stroke.  Right facial numbness EXAM: CT HEAD WITHOUT CONTRAST TECHNIQUE: Contiguous axial images were obtained from the base of the skull through the vertex without intravenous contrast. RADIATION DOSE REDUCTION: This exam was performed according to the departmental dose-optimization program which includes automated exposure control, adjustment of the mA and/or kV according to patient size and/or use of iterative reconstruction technique. COMPARISON:  Brain MRI 05/10/2007 FINDINGS: Brain: There is no acute intracranial hemorrhage, extra-axial fluid collection, or acute infarct. Parenchymal volume is normal. The ventricles are normal in size. Gray-white differentiation is preserved. 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 A partially empty sella is noted, nonspecific. There is no solid mass lesion. There is no mass effect or midline shift. Vascular: No dense vessel is seen. Skull: Normal. Negative for fracture or focal lesion. Sinuses/Orbits: The imaged paranasal sinuses are clear. Bilateral lens implants are in place. The globes and orbits are otherwise unremarkable. ASPECTS [facility] Stroke Program Early CT Score) - Ganglionic level infarction (caudate, lentiform nuclei, internal capsule, insula, M1-M3 cortex): 7 - Supraganglionic infarction (M4-M6 cortex): 3 Total score (0-10 with 10 being normal): 10 IMPRESSION: No acute intracranial pathology. Electronically Signed   By: Valetta Mole M.D.   On: 10/01/2022 17:07    Microbiology: Results for orders placed or performed  in visit on 03/14/22  Urine Culture     Status: None   Collection Time: 03/14/22  2:41 PM   Specimen: Urine  Result Value Ref Range Status   MICRO NUMBER: [id number]  Final   SPECIMEN QUALITY: Adequate  Final   Sample Source URINE  Final   STATUS: FINAL  Final   Result: No Growth  Final    Labs: CBC: Recent Labs  Lab 10/01/22 1700  WBC 8.0  NEUTROABS 5.2  HGB 14.5  HCT 42.2  MCV 87.4  PLT 123XX123   Basic Metabolic Panel: Recent Labs  Lab 10/01/22 1700  NA 141  K 3.6  CL 105  CO2 26  GLUCOSE 119*  BUN 17  CREATININE 0.92  CALCIUM 10.9*   Liver Function Tests: Recent Labs  Lab 10/01/22 1700  AST 32  ALT 46*  ALKPHOS 89  BILITOT 0.4  PROT 7.9  ALBUMIN 4.8   CBG: Recent Labs  Lab 10/01/22 1700  GLUCAP 107*    Discharge time spent: 35 minutes.  Signed: Cordelia Poche, MD [facility] 10/02/2022

## 2022-10-02 NOTE — Progress Notes (Signed)
  Echocardiogram 2D Echocardiogram has been performed.  Erika Bolton 10/02/2022, 9:36 AM

## 2022-10-02 NOTE — Progress Notes (Signed)
Speech Language Pathology T Patient Details Name: Erika Bolton MRN: [id number] DOB: 02/28/1956 Today's Date: 10/02/2022 Time:  -     Pt's speech-language-cognition screened. Pt stated she could not remember one word yesterday but is not having any issues today. Full assessment not needed and pt and neuro NP in agreement.     Houston Siren  10/02/2022, 11:36 AM

## 2022-10-02 NOTE — Hospital Course (Addendum)
Erika Bolton is a 67 y.o. female with a history of cervical spine stenosis, LBBB, hypertension, GERD, MGUS. Patient presented secondary to right sided face, arm and leg numbness. Initial concern for stroke vs TIA. Imaging without evidence of acute stroke. Transthoracic Echocardiogram significant for newly diagnosed cardiomyopathy with LVEF of 35%.

## 2022-10-02 NOTE — ED Notes (Signed)
ED TO INPATIENT HANDOFF REPORT  ED Nurse Name and Phone #: Randall Hiss [id number] Name/Age/Gender Erika Bolton 67 y.o. female Room/Bed: 027C/027C  Code Status   Code Status: Full Code  Home/SNF/Other Home Patient oriented to: self Is this baseline? Yes   Triage Complete: Triage complete  Chief Complaint TIA (transient ischemic attack) [G45.9]  Triage Note Pt via pov from home with numbness to her right side. She reports she has a hx of numbness in both legs but that after working out she felt numbness from her feet to her head. Pt also reports that she has felt off balance for the last hour. LKW aprox 1430 hours No deficits noted during triage. NAD noted.   Pt from [place] for an MRI all symptoms she had previous are resolved. Back to baseline which is numbness in both legs most of the time   Allergies Allergies  Allergen Reactions   Other Other (See Comments)    Potassium dichromate, broke out in dry rashes   Penicillins Swelling    Throat swelling - hasn't had since was a teen   Gold Sodium Thiosulfate Rash   Nickel Rash    Level of Care/Admitting Diagnosis ED Disposition     ED Disposition  Admit   Condition  --   [place]: [place] [100100]  Level of Care: Telemetry Medical [104]  May place patient in observation at [facility] or [facility] if equivalent level of care is available:: Yes  Covid Evaluation: Asymptomatic - no recent exposure (last 10 days) testing not required  Diagnosis: TIA (transient ischemic attack) [id number]  Admitting Physician: Shela Leff [id number]  Attending Physician: Shela Leff [id number]          B Medical/Surgery History Past Medical History:  Diagnosis Date   Colon polyps 2003   Detached retina    Hypertension    Hypertensive retinopathy    Kidney stone 2016   Left bundle branch block 2012   Menorrhagia    MGUS (monoclonal gammopathy of unknown significance)    Plantar  fasciitis    Precancerous lesion 1987   mole excised from vulva   PVD (posterior vitreous detachment), right 09/07/2015   Past Surgical History:  Procedure Laterality Date   ABLATION  11/10/2002   Hysteroscopic Thermal   CATARACT EXTRACTION     cyst on scalp     sebaccous(multiple times as a teen)   EYE SURGERY     gastro surgery  07/31/2010   LAPAROSCOPIC CHOLECYSTECTOMY  11/28/2001   left leg muscle surgery  07/31/2010   OTHER SURGICAL HISTORY     Sebaceous cyst on head removed 7x's   RETINAL DETACHMENT SURGERY       A IV Location/Drains/Wounds Patient Lines/Drains/Airways Status     Active Line/Drains/Airways     Name Placement date Placement time Site Days   Peripheral IV 10/01/22 20 G Left Antecubital 10/01/22  1706  Antecubital  1            Intake/Output Last 24 hours No intake or output data in the 24 hours ending 10/02/22 0254  Labs/Imaging Results for orders placed or performed during the hospital encounter of 10/01/22 (from the past 48 hour(s))  Ethanol     Status: None   Collection Time: 10/01/22  5:00 PM  Result Value Ref Range   Alcohol, Ethyl (B) <10 <10 mg/dL    Comment: (NOTE) Lowest detectable limit for serum alcohol is 10 mg/dL.  For medical  purposes only. Performed at [facility], [address], [place], [place] [address]   Protime-INR     Status: None   Collection Time: 10/01/22  5:00 PM  Result Value Ref Range   Prothrombin Time 13.5 11.4 - 15.2 seconds   INR 1.0 0.8 - 1.2    Comment: (NOTE) INR goal varies based on device and disease states. Performed at [facility], [address] [place], [place] [address]   APTT     Status: None   Collection Time: 10/01/22  5:00 PM  Result Value Ref Range   aPTT 26 24 - 36 seconds    Comment: Performed at [facility], [address] [place], [place] [address]  CBC     Status: None   Collection Time: 10/01/22  5:00 PM  Result  Value Ref Range   WBC 8.0 4.0 - 10.5 K/uL   RBC 4.83 3.87 - 5.11 MIL/uL   Hemoglobin 14.5 12.0 - 15.0 g/dL   HCT 42.2 36.0 - 46.0 %   MCV 87.4 80.0 - 100.0 fL   MCH 30.0 26.0 - 34.0 pg   MCHC 34.4 30.0 - 36.0 g/dL   RDW 13.1 11.5 - 15.5 %   Platelets 297 150 - 400 K/uL   nRBC 0.0 0.0 - 0.2 %    Comment: Performed at [facility], [address], [place], [place] [address]  Differential     Status: None   Collection Time: 10/01/22  5:00 PM  Result Value Ref Range   Neutrophils Relative % 64 %   Neutro Abs 5.2 1.7 - 7.7 K/uL   Lymphocytes Relative 22 %   Lymphs Abs 1.7 0.7 - 4.0 K/uL   Monocytes Relative 9 %   Monocytes Absolute 0.7 0.1 - 1.0 K/uL   Eosinophils Relative 4 %   Eosinophils Absolute 0.3 0.0 - 0.5 K/uL   Basophils Relative 1 %   Basophils Absolute 0.1 0.0 - 0.1 K/uL   Immature Granulocytes 0 %   Abs Immature Granulocytes 0.02 0.00 - 0.07 K/uL    Comment: Performed at [facility], [place], [place] [address]  Comprehensive metabolic panel     Status: Abnormal   Collection Time: 10/01/22  5:00 PM  Result Value Ref Range   Sodium 141 135 - 145 mmol/L   Potassium 3.6 3.5 - 5.1 mmol/L   Chloride 105 98 - 111 mmol/L   CO2 26 22 - 32 mmol/L   Glucose, Bld 119 (H) 70 - 99 mg/dL    Comment: Glucose reference range applies only to samples taken after fasting for at least 8 hours.   BUN 17 8 - 23 mg/dL   Creatinine, Ser 0.92 0.44 - 1.00 mg/dL   Calcium 10.9 (H) 8.9 - 10.3 mg/dL   Total Protein 7.9 6.5 - 8.1 g/dL   Albumin 4.8 3.5 - 5.0 g/dL   AST 32 15 - 41 U/L   ALT 46 (H) 0 - 44 U/L   Alkaline Phosphatase 89 38 - 126 U/L   Total Bilirubin 0.4 0.3 - 1.2 mg/dL   GFR, Estimated >60 >60 mL/min    Comment: (NOTE) Calculated using the CKD-EPI Creatinine Equation (2021)    Anion gap 10 5 - 15    Comment: Performed at [facility], [address] [place],  [address]  CBG monitoring, ED      Status: Abnormal   Collection Time: 10/01/22  5:00 PM  Result Value Ref  Range   Glucose-Capillary 107 (H) 70 - 99 mg/dL    Comment: Glucose reference range applies only to samples taken after fasting for at least 8 hours.  Urine rapid drug screen (hosp performed)     Status: None   Collection Time: 10/01/22  5:30 PM  Result Value Ref Range   Opiates NONE DETECTED NONE DETECTED   Cocaine NONE DETECTED NONE DETECTED   Benzodiazepines NONE DETECTED NONE DETECTED   Amphetamines NONE DETECTED NONE DETECTED   Tetrahydrocannabinol NONE DETECTED NONE DETECTED   Barbiturates NONE DETECTED NONE DETECTED    Comment: (NOTE) DRUG SCREEN FOR MEDICAL PURPOSES ONLY.  IF CONFIRMATION IS NEEDED FOR ANY PURPOSE, NOTIFY LAB WITHIN 5 DAYS.  LOWEST DETECTABLE LIMITS FOR URINE DRUG SCREEN Drug Class                     Cutoff (ng/mL) Amphetamine and metabolites    1000 Barbiturate and metabolites    200 Benzodiazepine                 200 Opiates and metabolites        300 Cocaine and metabolites        300 THC                            50 Performed at [facility], [address] [place],  [address]    MR Cervical Spine Wo Contrast  Result Date: 10/02/2022 CLINICAL DATA:  Initial evaluation for acute myelopathy. EXAM: MRI CERVICAL SPINE WITHOUT CONTRAST TECHNIQUE: Multiplanar, multisequence MR imaging of the cervical spine was performed. No intravenous contrast was administered. COMPARISON:  Prior MRI from 09/07/2021. FINDINGS: Alignment: Straightening with slight reversal of the normal cervical lordosis. Trace degenerative retrolisthesis of C5 on C6. Vertebrae: Vertebral body height maintained without acute or chronic fracture. Bone marrow signal intensity within normal limits. Few subcentimeter benign hemangiomata noted. No worrisome osseous lesions. No abnormal marrow edema. Cord: Normal signal and morphology. Posterior Fossa, vertebral arteries, paraspinal tissues:  Unremarkable. Disc levels: C2-C3: Small central disc protrusion with annular fissure indents the ventral thecal sac. No spinal stenosis or cord impingement. Foramina remain patent. C3-C4: Small central disc protrusion with annular fissure indents the ventral thecal sac. No significant spinal stenosis or cord impingement. Foramina remain patent. C4-C5: Disc desiccation with mild disc bulge and uncovertebral spurring. Superimposed small central disc protrusion with annular fissure indents the ventral thecal sac (series 7, image 22). Minimal cord flattening without cord signal changes. Mild spinal stenosis. Foramina remain patent. C5-C6: Trace retrolisthesis. Degenerative intervertebral disc space narrowing with diffuse disc osteophyte complex. Broad posterior component flattens and effaces the ventral thecal sac. Mild cord flattening without cord signal changes. Moderate to severe spinal stenosis with the thecal sac measuring 7 mm in AP diameter. Foramina remain patent. C6-C7: Right paracentral disc extrusion with superior migration (series 7, image 30). For disc material contacts and minimally flattens the ventral cord. No cord signal changes. Moderate spinal stenosis. Foramina remain patent. C7-T1:  Unremarkable. IMPRESSION: 1. Normal MRI appearance of the cervical spinal cord. No cord signal changes to suggest myelopathy. No other acute abnormality. 2. Degenerative disc osteophyte at C5-6 with resultant moderate to severe spinal stenosis. 3. Right paracentral disc extrusion with superior migration at C6-7 with resultant moderate spinal stenosis. 4. Small central disc protrusion at C4-5 with resultant mild spinal stenosis. Electronically Signed   By: Jeannine Boga M.D.   On:  10/02/2022 01:44   MR BRAIN WO CONTRAST  Result Date: 10/02/2022 CLINICAL DATA:  Initial evaluation for neuro deficit, stroke suspected. EXAM: MRI HEAD WITHOUT CONTRAST TECHNIQUE: Multiplanar, multiecho pulse sequences of the brain and  surrounding structures were obtained without intravenous contrast. COMPARISON:  Prior CTs from 10/01/2022. FINDINGS: Brain: Cerebral volume within normal limits. Minimal scattered chronic microvascular ischemic disease noted involving the supratentorial cerebral white matter. Small remote left cerebellar infarct. No evidence for acute or subacute ischemia. Gray-white matter differentiation maintained. Orders of chronic cortical infarction. No acute intracranial hemorrhage. Single punctate chronic microhemorrhage noted within the right thalamus. No other chronic intracranial blood products. No mass lesion, midline shift or mass effect. No hydrocephalus or extra-axial fluid collection. Pituitary gland and suprasellar region within normal limits. Vascular: Major intracranial vascular flow voids are maintained. Skull and upper cervical spine: Craniocervical junction normal. Bone marrow signal intensity within normal limits. No scalp soft tissue abnormality. Sinuses/Orbits: Prior bilateral ocular lens replacement. Paranasal sinuses are clear. No mastoid effusion. Other: None. IMPRESSION: 1. No acute intracranial abnormality. 2. Small remote left cerebellar infarct. 3. Underlying mild chronic microvascular ischemic disease for age. Electronically Signed   By: Jeannine Boga M.D.   On: 10/02/2022 01:38   CT Angio Head Neck W WO CM  Result Date: 10/01/2022 CLINICAL DATA:  Initial evaluation for right-sided hemiparesis., which EXAM: CT ANGIOGRAPHY HEAD AND NECK TECHNIQUE: Multidetector CT imaging of the head and neck was performed using the standard protocol during bolus administration of intravenous contrast. Multiplanar CT image reconstructions and MIPs were obtained to evaluate the vascular anatomy. Carotid stenosis measurements (when applicable) are obtained utilizing NASCET criteria, using the distal internal carotid diameter as the denominator. RADIATION DOSE REDUCTION: This exam was performed according to the  departmental dose-optimization program which includes automated exposure control, adjustment of the mA and/or kV according to patient size and/or use of iterative reconstruction technique. CONTRAST:  5m OMNIPAQUE IOHEXOL 350 MG/ML SOLN COMPARISON:  Prior head CT from earlier the same day. FINDINGS: CTA NECK FINDINGS Aortic arch: Visualized aortic arch normal caliber with standard 3 vessel morphology. No stenosis about the origin of the great vessels. Right carotid system: Right common and internal carotid arteries widely patent without stenosis, dissection or occlusion. Left carotid system: Left common and internal carotid arteries widely patent without stenosis, dissection or occlusion. Vertebral arteries: Both vertebral arteries arise from the subclavian arteries. No proximal subclavian artery stenosis. Both vertebral arteries widely patent without stenosis, dissection or occlusion. Skeleton: No discrete or worrisome osseous lesions. Moderate spondylosis noted at C5-6. Other neck: No other acute soft tissue abnormality within the neck. Upper chest: Visualized upper chest demonstrates no acute finding. Review of the MIP images confirms the above findings CTA HEAD FINDINGS Anterior circulation: Both internal carotid arteries widely patent to the termini without stenosis. A1 segments widely patent. Normal anterior communicating artery complex. Both anterior cerebral arteries widely patent to their distal aspects without stenosis. No M1 stenosis or occlusion. Normal MCA bifurcations. Distal MCA branches well perfused and symmetric. Posterior circulation: Both V4 segments patent to the vertebrobasilar junction without stenosis. Both PICA origins patent and normal. Basilar widely patent to its distal aspect without stenosis. Superior cerebellar arteries patent bilaterally. Both PCAs primarily supplied via the basilar and are well perfused to there distal aspects. Venous sinuses: Patent allowing for timing the contrast  bolus. Anatomic variants: None significant.  No aneurysm. Review of the MIP images confirms the above findings IMPRESSION: Normal CTA of the head and neck.  No large vessel occlusion or other emergent finding. No hemodynamically significant or correctable stenosis. Electronically Signed   By: Jeannine Boga M.D.   On: 10/01/2022 20:11   CT HEAD CODE STROKE WO CONTRAST  Result Date: 10/01/2022 CLINICAL DATA:  Code stroke.  Right facial numbness EXAM: CT HEAD WITHOUT CONTRAST TECHNIQUE: Contiguous axial images were obtained from the base of the skull through the vertex without intravenous contrast. RADIATION DOSE REDUCTION: This exam was performed according to the departmental dose-optimization program which includes automated exposure control, adjustment of the mA and/or kV according to patient size and/or use of iterative reconstruction technique. COMPARISON:  Brain MRI 05/10/2007 FINDINGS: Brain: There is no acute intracranial hemorrhage, extra-axial fluid collection, or acute infarct. Parenchymal volume is normal. The ventricles are normal in size. Gray-white differentiation is preserved. 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 A partially empty sella is noted, nonspecific. There is no solid mass lesion. There is no mass effect or midline shift. Vascular: No dense vessel is seen. Skull: Normal. Negative for fracture or focal lesion. Sinuses/Orbits: The imaged paranasal sinuses are clear. Bilateral lens implants are in place. The globes and orbits are otherwise unremarkable. ASPECTS [facility] Stroke Program Early CT Score) - Ganglionic level infarction (caudate, lentiform nuclei, internal capsule, insula, M1-M3 cortex): 7 - Supraganglionic infarction (M4-M6 cortex): 3 Total score (0-10 with 10 being normal): 10 IMPRESSION: No acute intracranial pathology. Electronically Signed   By: Valetta Mole M.D.   On: 10/01/2022 17:07    Pending Labs Unresulted Labs (From admission, onward)      Start     Ordered   10/02/22 0500  Lipid panel  (Labs)  Tomorrow morning,   R       Comments: Fasting    10/02/22 0223   10/02/22 0222  HIV Antibody (routine testing w rflx)  (HIV Antibody (Routine testing w reflex) panel)  Once,   R        10/02/22 0223   10/02/22 0222  Hemoglobin A1c  (Labs)  Once,   R       Comments: To assess prior glycemic control    10/02/22 0223            Vitals/Pain Today's Vitals   10/01/22 2301 10/01/22 2302 10/02/22 0115 10/02/22 0249  BP:  (!) 148/74 139/78   Pulse:  85 79   Resp:  16    Temp: 98 F (36.7 C) 98 F (36.7 C)  97.9 F (36.6 C)  TempSrc:  Oral  Oral  SpO2:  99% 95%   Weight:      Height:      PainSc:  0-No pain      Isolation Precautions No active isolations  Medications Medications  famotidine (PEPCID) tablet 40 mg (has no administration in time range)   stroke: early stages of recovery book (has no administration in time range)  enoxaparin (LOVENOX) injection 40 mg (has no administration in time range)  acetaminophen (TYLENOL) tablet 650 mg (has no administration in time range)  aspirin EC tablet 81 mg (81 mg Oral Given 10/02/22 0235)  clopidogrel (PLAVIX) tablet 75 mg (75 mg Oral Given 10/02/22 0235)  iohexol (OMNIPAQUE) 350 MG/ML injection 75 mL (75 mLs Intravenous Contrast Given 10/01/22 1814)    Mobility walks     Focused Assessments Neuro Assessment Handoff:  Swallow screen pass? Yes    NIH Stroke Scale  Dizziness Present: No Headache Present: No Interval: Initial Level of Consciousness (1a.)   :  Alert, keenly responsive LOC Questions (1b. )   : Answers both questions correctly LOC Commands (1c. )   : Performs both tasks correctly Best Gaze (2. )  : Normal Visual (3. )  : No visual loss Facial Palsy (4. )    : Normal symmetrical movements Motor Arm, Left (5a. )   : No drift Motor Arm, Right (5b. ) : No drift Motor Leg, Left (6a. )  : No drift Motor Leg, Right (6b. ) : No drift Limb Ataxia (7. ):  Absent Sensory (8. )  : Normal, no sensory loss Best Language (9. )  : No aphasia Dysarthria (10. ): Normal Extinction/Inattention (11.)   : No Abnormality Complete NIHSS TOTAL: 0 Last date known well: 10/01/22 Last time known well: 1530 Neuro Assessment:   Neuro Checks:   Initial (10/02/22 0249)  Has TPA been given? No If patient is a Neuro Trauma and patient is going to OR before floor call report to [place] nurse: [phone] or [phone]   R Recommendations: See Admitting Provider Note  Report given to:   Additional Notes: pt ambulatory, a and o x 4

## 2022-10-02 NOTE — TOC Transition Note (Signed)
Transition of Care [facility]) - CM/SW Discharge Note   Patient Details  Name: Erika Bolton MRN: [id number] Date of Birth: [birth year]  Transition of Care [facility]) CM/SW Contact:  Pollie Friar, RN Phone Number: 10/02/2022, 1:33 PM   Clinical Narrative:    Pt is from home with her spouse. They are together most of the time. She denies any DME at home.  She manages her own medications and denies any issues.  Pt drives self as needed but spouse can provide transportation. He will transport her home once d/ced.  No f/u per PT.    Final next level of care: Home/Self Care Barriers to Discharge: No Barriers Identified   Patient Goals and CMS Choice      Discharge Placement                         Discharge Plan and Services Additional resources added to the After Visit Summary for                                       Social Determinants of Health (SDOH) Interventions SDOH Screenings   Tobacco Use: Low Risk  (10/01/2022)     Readmission Risk Interventions     No data to display

## 2022-10-02 NOTE — Progress Notes (Signed)
PROGRESS NOTE    [name]  [id number] DOB: 12-24-1955 DOA: 10/01/2022 PCP: Lavone Orn, MD   Brief Narrative: Erika Bolton is a 67 y.o. female with a history of cervical spine stenosis, LBBB, hypertension, GERD, MGUS. Patient presented secondary to right sided face, arm and leg numbness. Initial concern for stroke vs TIA. Imaging without evidence of acute stroke. Transthoracic Echocardiogram significant for newly diagnosed cardiomyopathy with LVEF of 35%.   Assessment and Plan:  Right arm numbness CT head significant for no acute intracranial pathology. MRI confirms no acute stroke/intracranial abnormality. CTA head and neck significant for no large vessel occlusion. LDL of 124. Hemoglobin A1C pending. Transthoracic Echocardiogram significant for LVEF of 35% with associated grade 1 diastolic dysfunction. Neurology recommendations for Aspirin and Plavix. PT/OT recommendations for no follow-up.   LBBB Known. History of left heart catheterization in 2012 which was unremarkable and significant for normal LVEF.   Cardiomyopathy Newly diagnosed. LVEF of 35% on Transthoracic Echocardiogram. Cardiology consulted and plan   DVT prophylaxis: Lovenox Code Status:   Code Status: Full Code Family Communication: None at bedside Disposition Plan: Discharge home likely in 24 hours pending cardiac workup   Consultants:  Neurology Cardiology  Procedures:  Transthoracic Echocardiogram  Antimicrobials: None    Subjective: Patient reports persistent right sided numbness which has improved but not completely resolved.  Objective: BP (!) 137/56 (BP Location: Right Arm)   Pulse 81   Temp 98.1 F (36.7 C) (Oral)   Resp 16   Ht 5' 4.5" (1.638 m)   Wt 77.4 kg   LMP 08/01/2007 (Approximate)   SpO2 98%   BMI 28.84 kg/m   Examination:  General exam: Appears calm and comfortable Respiratory system: Clear to auscultation. Respiratory effort normal. Cardiovascular system: S1  & S2 heard, RRR. No murmurs, rubs, gallops or clicks. Normal bowel sounds heard. Central nervous system: Alert and oriented. No focal neurological deficits. Musculoskeletal: No edema. No calf tenderness Skin: No cyanosis. No rashes Psychiatry: Judgement and insight appear normal. Mood & affect appropriate.    Data Reviewed: I have personally reviewed following labs and imaging studies  CBC Lab Results  Component Value Date   WBC 8.0 10/01/2022   RBC 4.83 10/01/2022   HGB 14.5 10/01/2022   HCT 42.2 10/01/2022   MCV 87.4 10/01/2022   MCH 30.0 10/01/2022   PLT 297 10/01/2022   MCHC 34.4 10/01/2022   RDW 13.1 10/01/2022   LYMPHSABS 1.7 10/01/2022   MONOABS 0.7 10/01/2022   EOSABS 0.3 10/01/2022   BASOSABS 0.1 [id number]     Last metabolic panel Lab Results  Component Value Date   NA 141 10/01/2022   K 3.6 10/01/2022   CL 105 10/01/2022   CO2 26 10/01/2022   BUN 17 10/01/2022   CREATININE 0.92 10/01/2022   GLUCOSE 119 (H) 10/01/2022   GFRNONAA >60 10/01/2022   GFRAA >60 01/09/2020   CALCIUM 10.9 (H) 10/01/2022   PROT 7.9 10/01/2022   ALBUMIN 4.8 10/01/2022   LABGLOB 3.1 01/10/2022   BILITOT 0.4 10/01/2022   ALKPHOS 89 10/01/2022   AST 32 10/01/2022   ALT 46 (H) 10/01/2022   ANIONGAP 10 10/01/2022    GFR: Estimated Creatinine Clearance: 62.9 mL/min (by C-G formula based on SCr of 0.92 mg/dL).  No results found for this or any previous visit (from the past 240 hour(s)).    Radiology Studies: ECHOCARDIOGRAM COMPLETE  Result Date: 10/02/2022    ECHOCARDIOGRAM REPORT   Patient Name:   Erika  Altha Bolton Date of Exam: 10/02/2022 Medical Rec #:  [id number]      Height:       64.5 in Accession #:    [id number]     Weight:       170.6 lb Date of Birth:  [birth year]      BSA:          1.838 m Patient Age:    4 years       BP:           127/64 mmHg Patient Gender: F              HR:           74 bpm. Exam Location:  Inpatient Procedure: 2D Echo Indications:    TIA  History:         Patient has no prior history of Echocardiogram examinations.                 TIA; Risk Factors:Hypertension.  Sonographer:    Harvie Junior Referring Phys: Shela Leff  Sonographer Comments: Technically difficult study due to poor echo windows. IMPRESSIONS  1. Difficult acoustic windows. Hypokkinesis of the mid/distal septal wall, distal lateral. Hypokinesis of the inferior wall and apex. . Left ventricular ejection fraction, by estimation, is 35%. The left ventricle has severely decreased function. There is mild left ventricular hypertrophy. Left ventricular diastolic parameters are consistent with Grade I diastolic dysfunction (impaired relaxation).  2. Right ventricular systolic function is normal. The right ventricular size is normal.  3. Trivial mitral valve regurgitation.  4. The aortic valve is tricuspid. Aortic valve regurgitation is not visualized.  5. The inferior vena cava is normal in size with greater than 50% respiratory variability, suggesting right atrial pressure of 3 mmHg. FINDINGS  Left Ventricle: Difficult acoustic windows. Hypokkinesis of the mid/distal septal wall, distal lateral. Hypokinesis of the inferior wall and apex. Left ventricular ejection fraction, by estimation, is 35%. The left ventricle has severely decreased function. The left ventricular internal cavity size was normal in size. There is mild left ventricular hypertrophy. Left ventricular diastolic parameters are consistent with Grade I diastolic dysfunction (impaired relaxation). Right Ventricle: The right ventricular size is normal. Right vetricular wall thickness was not assessed. Right ventricular systolic function is normal. Left Atrium: Left atrial size was normal in size. Right Atrium: Right atrial size was normal in size. Pericardium: There is no evidence of pericardial effusion. Mitral Valve: There is mild thickening of the mitral valve leaflet(s). Mild mitral annular calcification. Trivial mitral valve  regurgitation. Tricuspid Valve: The tricuspid valve is grossly normal. Tricuspid valve regurgitation is not demonstrated. Aortic Valve: The aortic valve is tricuspid. Aortic valve regurgitation is not visualized. Aortic valve mean gradient measures 3.0 mmHg. Aortic valve peak gradient measures 5.1 mmHg. Aortic valve area, by VTI measures 3.12 cm. Pulmonic Valve: The pulmonic valve was not well visualized. Pulmonic valve regurgitation is trivial. No evidence of pulmonic stenosis. Aorta: The aortic root is normal in size and structure. Venous: The inferior vena cava is normal in size with greater than 50% respiratory variability, suggesting right atrial pressure of 3 mmHg. IAS/Shunts: No atrial level shunt detected by color flow Doppler.  LEFT VENTRICLE PLAX 2D LVIDd:         5.20 cm      Diastology LVIDs:         3.70 cm      LV e' medial:    5.00 cm/s LV PW:  0.90 cm      LV E/e' medial:  12.2 LV IVS:        0.90 cm      LV e' lateral:   6.58 cm/s LVOT diam:     2.30 cm      LV E/e' lateral: 9.2 LV SV:         73 LV SV Index:   40 LVOT Area:     4.15 cm                              3D Volume EF: LV Volumes (MOD)            3D EF:        45 % LV vol d, MOD A2C: 142.0 ml LV EDV:       149 ml LV vol d, MOD A4C: 92.4 ml  LV ESV:       81 ml LV vol s, MOD A2C: 64.4 ml  LV SV:        68 ml LV vol s, MOD A4C: 51.3 ml LV SV MOD A2C:     77.6 ml LV SV MOD A4C:     92.4 ml LV SV MOD BP:      63.0 ml RIGHT VENTRICLE RV Basal diam:  2.60 cm RV Mid diam:    2.40 cm RV S prime:     11.90 cm/s TAPSE (M-mode): 2.0 cm LEFT ATRIUM           Index        RIGHT ATRIUM          Index LA diam:      3.40 cm 1.85 cm/m   RA Area:     8.87 cm LA Vol (A4C): 55.5 ml 30.19 ml/m  RA Volume:   15.70 ml 8.54 ml/m  AORTIC VALVE                    PULMONIC VALVE AV Area (Vmax):    3.18 cm     PV Vmax:       1.02 m/s AV Area (Vmean):   3.12 cm     PV Peak grad:  4.2 mmHg AV Area (VTI):     3.12 cm AV Vmax:           113.00 cm/s AV  Vmean:          74.900 cm/s AV VTI:            0.233 m AV Peak Grad:      5.1 mmHg AV Mean Grad:      3.0 mmHg LVOT Vmax:         86.40 cm/s LVOT Vmean:        56.200 cm/s LVOT VTI:          0.175 m LVOT/AV VTI ratio: 0.75  AORTA Ao Root diam: 3.50 cm Ao Asc diam:  3.30 cm MITRAL VALVE MV Area (PHT): 3.91 cm    SHUNTS MV Decel Time: 194 msec    Systemic VTI:  0.18 m MV E velocity: 60.80 cm/s  Systemic Diam: 2.30 cm MV A velocity: 80.80 cm/s MV E/A ratio:  0.75 Dorris Carnes MD Electronically signed by Dorris Carnes MD Signature Date/Time: 10/02/2022/1:51:21 PM    Final    MR Cervical Spine Wo Contrast  Result Date: 10/02/2022 CLINICAL DATA:  Initial evaluation for acute myelopathy. EXAM: MRI CERVICAL SPINE WITHOUT CONTRAST TECHNIQUE:  Multiplanar, multisequence MR imaging of the cervical spine was performed. No intravenous contrast was administered. COMPARISON:  Prior MRI from 09/07/2021. FINDINGS: Alignment: Straightening with slight reversal of the normal cervical lordosis. Trace degenerative retrolisthesis of C5 on C6. Vertebrae: Vertebral body height maintained without acute or chronic fracture. Bone marrow signal intensity within normal limits. Few subcentimeter benign hemangiomata noted. No worrisome osseous lesions. No abnormal marrow edema. Cord: Normal signal and morphology. Posterior Fossa, vertebral arteries, paraspinal tissues: Unremarkable. Disc levels: C2-C3: Small central disc protrusion with annular fissure indents the ventral thecal sac. No spinal stenosis or cord impingement. Foramina remain patent. C3-C4: Small central disc protrusion with annular fissure indents the ventral thecal sac. No significant spinal stenosis or cord impingement. Foramina remain patent. C4-C5: Disc desiccation with mild disc bulge and uncovertebral spurring. Superimposed small central disc protrusion with annular fissure indents the ventral thecal sac (series 7, image 22). Minimal cord flattening without cord signal changes.  Mild spinal stenosis. Foramina remain patent. C5-C6: Trace retrolisthesis. Degenerative intervertebral disc space narrowing with diffuse disc osteophyte complex. Broad posterior component flattens and effaces the ventral thecal sac. Mild cord flattening without cord signal changes. Moderate to severe spinal stenosis with the thecal sac measuring 7 mm in AP diameter. Foramina remain patent. C6-C7: Right paracentral disc extrusion with superior migration (series 7, image 30). For disc material contacts and minimally flattens the ventral cord. No cord signal changes. Moderate spinal stenosis. Foramina remain patent. C7-T1:  Unremarkable. IMPRESSION: 1. Normal MRI appearance of the cervical spinal cord. No cord signal changes to suggest myelopathy. No other acute abnormality. 2. Degenerative disc osteophyte at C5-6 with resultant moderate to severe spinal stenosis. 3. Right paracentral disc extrusion with superior migration at C6-7 with resultant moderate spinal stenosis. 4. Small central disc protrusion at C4-5 with resultant mild spinal stenosis. Electronically Signed   By: Jeannine Boga M.D.   On: 10/02/2022 01:44   MR BRAIN WO CONTRAST  Result Date: 10/02/2022 CLINICAL DATA:  Initial evaluation for neuro deficit, stroke suspected. EXAM: MRI HEAD WITHOUT CONTRAST TECHNIQUE: Multiplanar, multiecho pulse sequences of the brain and surrounding structures were obtained without intravenous contrast. COMPARISON:  Prior CTs from 10/01/2022. FINDINGS: Brain: Cerebral volume within normal limits. Minimal scattered chronic microvascular ischemic disease noted involving the supratentorial cerebral white matter. Small remote left cerebellar infarct. No evidence for acute or subacute ischemia. Gray-white matter differentiation maintained. Orders of chronic cortical infarction. No acute intracranial hemorrhage. Single punctate chronic microhemorrhage noted within the right thalamus. No other chronic intracranial blood  products. No mass lesion, midline shift or mass effect. No hydrocephalus or extra-axial fluid collection. Pituitary gland and suprasellar region within normal limits. Vascular: Major intracranial vascular flow voids are maintained. Skull and upper cervical spine: Craniocervical junction normal. Bone marrow signal intensity within normal limits. No scalp soft tissue abnormality. Sinuses/Orbits: Prior bilateral ocular lens replacement. Paranasal sinuses are clear. No mastoid effusion. Other: None. IMPRESSION: 1. No acute intracranial abnormality. 2. Small remote left cerebellar infarct. 3. Underlying mild chronic microvascular ischemic disease for age. Electronically Signed   By: Jeannine Boga M.D.   On: 10/02/2022 01:38   CT Angio Head Neck W WO CM  Result Date: 10/01/2022 CLINICAL DATA:  Initial evaluation for right-sided hemiparesis., which EXAM: CT ANGIOGRAPHY HEAD AND NECK TECHNIQUE: Multidetector CT imaging of the head and neck was performed using the standard protocol during bolus administration of intravenous contrast. Multiplanar CT image reconstructions and MIPs were obtained to evaluate the vascular anatomy. Carotid stenosis measurements (  when applicable) are obtained utilizing NASCET criteria, using the distal internal carotid diameter as the denominator. RADIATION DOSE REDUCTION: This exam was performed according to the departmental dose-optimization program which includes automated exposure control, adjustment of the mA and/or kV according to patient size and/or use of iterative reconstruction technique. CONTRAST:  102m OMNIPAQUE IOHEXOL 350 MG/ML SOLN COMPARISON:  Prior head CT from earlier the same day. FINDINGS: CTA NECK FINDINGS Aortic arch: Visualized aortic arch normal caliber with standard 3 vessel morphology. No stenosis about the origin of the great vessels. Right carotid system: Right common and internal carotid arteries widely patent without stenosis, dissection or occlusion. Left  carotid system: Left common and internal carotid arteries widely patent without stenosis, dissection or occlusion. Vertebral arteries: Both vertebral arteries arise from the subclavian arteries. No proximal subclavian artery stenosis. Both vertebral arteries widely patent without stenosis, dissection or occlusion. Skeleton: No discrete or worrisome osseous lesions. Moderate spondylosis noted at C5-6. Other neck: No other acute soft tissue abnormality within the neck. Upper chest: Visualized upper chest demonstrates no acute finding. Review of the MIP images confirms the above findings CTA HEAD FINDINGS Anterior circulation: Both internal carotid arteries widely patent to the termini without stenosis. A1 segments widely patent. Normal anterior communicating artery complex. Both anterior cerebral arteries widely patent to their distal aspects without stenosis. No M1 stenosis or occlusion. Normal MCA bifurcations. Distal MCA branches well perfused and symmetric. Posterior circulation: Both V4 segments patent to the vertebrobasilar junction without stenosis. Both PICA origins patent and normal. Basilar widely patent to its distal aspect without stenosis. Superior cerebellar arteries patent bilaterally. Both PCAs primarily supplied via the basilar and are well perfused to there distal aspects. Venous sinuses: Patent allowing for timing the contrast bolus. Anatomic variants: None significant.  No aneurysm. Review of the MIP images confirms the above findings IMPRESSION: Normal CTA of the head and neck. No large vessel occlusion or other emergent finding. No hemodynamically significant or correctable stenosis. Electronically Signed   By: BJeannine BogaM.D.   On: 10/01/2022 20:11   CT HEAD CODE STROKE WO CONTRAST  Result Date: 10/01/2022 CLINICAL DATA:  Code stroke.  Right facial numbness EXAM: CT HEAD WITHOUT CONTRAST TECHNIQUE: Contiguous axial images were obtained from the base of the skull through the vertex  without intravenous contrast. RADIATION DOSE REDUCTION: This exam was performed according to the departmental dose-optimization program which includes automated exposure control, adjustment of the mA and/or kV according to patient size and/or use of iterative reconstruction technique. COMPARISON:  Brain MRI 05/10/2007 FINDINGS: Brain: There is no acute intracranial hemorrhage, extra-axial fluid collection, or acute infarct. Parenchymal volume is normal. The ventricles are normal in size. Gray-white differentiation is preserved. 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 A partially empty sella is noted, nonspecific. There is no solid mass lesion. There is no mass effect or midline shift. Vascular: No dense vessel is seen. Skull: Normal. Negative for fracture or focal lesion. Sinuses/Orbits: The imaged paranasal sinuses are clear. Bilateral lens implants are in place. The globes and orbits are otherwise unremarkable. ASPECTS ([facility] Early [facility] Score) - Ganglionic level infarction (caudate, lentiform nuclei, internal capsule, insula, M1-M3 cortex): 7 - Supraganglionic infarction (M4-M6 cortex): 3 Total score (0-10 with 10 being normal): 10 IMPRESSION: No acute intracranial pathology. Electronically Signed   By: PValetta MoleM.D.   On: 10/01/2022 17:07      LOS: 0 days    RCordelia Poche MD [facility] 10/02/2022,  2:54 PM   If 7PM-7AM, please contact night-coverage [email or web address]

## 2022-10-02 NOTE — Progress Notes (Signed)
OT Cancellation Note  Patient Details Name: Erika Bolton MRN: [id number] DOB: 12-23-1955   Cancelled Treatment:    Reason Eval/Treat Not Completed: OT screened, no needs identified, will sign off Patient working with PT at time of OT arrival, with no need for acute OT intervention. Patient demonstrating no deficits in upper level cognition, vision, and independent with ADLs. OT will sign off at this time.  Corinne Ports E. Andreah Goheen, OTR/L Acute Rehabilitation Services [place] 10/02/2022, 8:32 AM

## 2022-10-02 NOTE — Evaluation (Signed)
Physical Therapy Evaluation and Discharge Patient Details Name: Erika Bolton MRN: [id number] DOB: 1956/03/06 Today's Date: 10/02/2022  History of Present Illness  Pt is a 67 y.o. F who presents 10/01/2022 with right face, arm and leg numbness. CT head and MRI negative for acute finding. MRI showing small remote left cerebellar infarct. Significant PMH: cervical spine stenosis, left bundle branch block, HTN.  Clinical Impression  Patient evaluated by Physical Therapy with no further acute PT needs identified. PTA, pt lives with her spouse and exercises at [facility]. Pt reports continued facial and RLE numbness, although intact to light touch upon assessment. No other focal deficits noted. Pt ambulating hallway distances and negotiated stairs independently. Scored 22/24 on Dynamic Gait Index, indicating she is not at high risk for falls. All education has been completed and the patient has no further questions. No follow-up Physical Therapy or equipment needs. PT is signing off. Thank you for this referral.      Recommendations for follow up therapy are one component of a multi-disciplinary discharge planning process, led by the attending physician.  Recommendations may be updated based on patient status, additional functional criteria and insurance authorization.  Follow Up Recommendations No PT follow up      Assistance Recommended at Discharge None  Patient can return home with the following       Equipment Recommendations None recommended by PT  Recommendations for Other Services       Functional Status Assessment Patient has not had a recent decline in their functional status     Precautions / Restrictions Precautions Precautions: None Restrictions Weight Bearing Restrictions: No      Mobility  Bed Mobility Overal bed mobility: Independent                  Transfers Overall transfer level: Independent Equipment used: None                     Ambulation/Gait Ambulation/Gait assistance: Independent Gait Distance (Feet): 300 Feet Assistive device: None Gait Pattern/deviations: WFL(Within Functional Limits)          Stairs Stairs: Yes Stairs assistance: Independent Stair Management: No rails Number of Stairs: 2    Wheelchair Mobility    Modified Rankin (Stroke Patients Only) Modified Rankin (Stroke Patients Only) Pre-Morbid Rankin Score: No symptoms Modified Rankin: No significant disability     Balance Overall balance assessment: Independent                               Standardized Balance Assessment Standardized Balance Assessment : Dynamic Gait Index   Dynamic Gait Index Level Surface: Normal Change in Gait Speed: Normal Gait with Horizontal Head Turns: Normal Gait with Vertical Head Turns: Normal Gait and Pivot Turn: Mild Impairment Step Over Obstacle: Mild Impairment Step Around Obstacles: Normal Steps: Normal Total Score: 22       Pertinent Vitals/Pain Pain Assessment Pain Assessment: No/denies pain    Home Living Family/patient expects to be discharged to:: Private residence Living Arrangements: Spouse/significant other Available Help at Discharge: Family Type of Home: House Home Access: Stairs to enter   Technical brewer of Steps: 2 (from garage) Alternate Level Stairs-Number of Steps:  (flight) Home Layout: Two level        Prior Function Prior Level of Function : Independent/Modified Independent             Mobility Comments: Retired. Participates in yoga, water fit classes,  walks track at [employer] Hand: Right    Extremity/Trunk Assessment   Upper Extremity Assessment Upper Extremity Assessment: Overall WFL for tasks assessed    Lower Extremity Assessment Lower Extremity Assessment: RLE deficits/detail;LLE deficits/detail RLE Deficits / Details: Strength 5/5, pt reports hx of intermittent BLE numbness  since December and acute RLE numbness RLE Sensation: WNL RLE Coordination: WNL LLE Deficits / Details: Strength 5/5, pt reports hx of intermittent BLE numbness since December LLE Coordination: WNL       Communication   Communication: No difficulties  Cognition Arousal/Alertness: Awake/alert Behavior During Therapy: WFL for tasks assessed/performed Overall Cognitive Status: Within Functional Limits for tasks assessed                                          General Comments      Exercises     Assessment/Plan    PT Assessment Patient does not need any further PT services  PT Problem List         PT Treatment Interventions      PT Goals (Current goals can be found in the Care Plan section)  Acute Rehab PT Goals Patient Stated Goal: to return to baseline PT Goal Formulation: All assessment and education complete, DC therapy    Frequency       Co-evaluation               AM-PAC PT "6 Clicks" Mobility  Outcome Measure Help needed turning from your back to your side while in a flat bed without using bedrails?: None Help needed moving from lying on your back to sitting on the side of a flat bed without using bedrails?: None Help needed moving to and from a bed to a chair (including a wheelchair)?: None Help needed standing up from a chair using your arms (e.g., wheelchair or bedside chair)?: None Help needed to walk in hospital room?: None Help needed climbing 3-5 steps with a railing? : None 6 Click Score: 24    End of Session   Activity Tolerance: Patient tolerated treatment well Patient left: in bed;with call bell/phone within reach Nurse Communication: Mobility status;Other (comment) (pt independent) PT Visit Diagnosis: Other symptoms and signs involving the nervous system [id number])    Time: [id number] PT Time Calculation (min) (ACUTE ONLY): 25 min   Charges:   PT Evaluation $PT Eval Low Complexity: 1 Low PT Treatments $Therapeutic  Activity: 8-22 mins        Wyona Almas, PT, DPT Acute Rehabilitation Services Office [phone]   Deno Etienne 10/02/2022, 9:35 AM

## 2022-10-02 NOTE — Consult Note (Addendum)
Cardiology Consultation   Patient ID: [name] MRN: [id number]; DOB: 10-29-1955  Admit date: 10/01/2022 Date of Consult: 10/02/2022  PCP:  Lavone Orn, [place] Providers Cardiologist:  None       new Patient Profile:   Erika Bolton is a 67 y.o. female with a hx of normal coronaries in 2012 on cath, chronic LBBB, HTN, GERD, MGUS, cervical spine stenosis who is being seen 10/02/2022 for the evaluation of abnormal Echo at the request of Dr. Lonny Prude after admit for TIA.  History of Present Illness:   Erika Bolton was seen in 2012 for cardiac cath with LBBB and abnormal nuc study..   She had normal coronary arteries.  Echo at that time with septal wall motion abnormality, consistent with LBBB.    Pt presented  last pm for rt sided numbness, rt sided facial numbness as well. BP was elevated at 169/71, CT head neg for acute finding,  CTA head and neck neg for LVO. No tPA given with mild symptoms.   EKG:  The EKG was personally reviewed and demonstrates:  ST at 108 with LBBB no changes from prior EKGs Telemetry:  Telemetry was personally reviewed and demonstrates:  SR  MRI of Brain IMPRESSION: 1. No acute intracranial abnormality. 2. Small remote left cerebellar infarct. 3. Underlying mild chronic microvascular ischemic disease for age.    Dx of TIA -allowed permissive HTN hold home antihypertensives,  amlodipine 5 mg. Na 141, K+ 3.6, BUN 17 Cr 0.92  Tchol 206, HDL 68 LDL 124  Hgb 14.5 WBC 8 plts 297   Echo now EF 35%, hypokinesis of inf wall and apex.  Mild LVH, G1DD, RV is normal   She tells me she has occ episode of SOB began last year once with introducing someone at event and her PCP did EKG and was normal with her LBBB and gave meds for anxiety.  Now when exercise she will slow down due to feeling dyspneic.  Does not last long  no edema no racing HR.  No syncope.  No chest pain.     BP 133/60 P 87 R 15 afebrile.    Past Medical History:  Diagnosis Date    Colon polyps 2003   Detached retina    Hypertension    Hypertensive retinopathy    Kidney stone 2016   Left bundle branch block 2012   Menorrhagia    MGUS (monoclonal gammopathy of unknown significance)    Plantar fasciitis    Precancerous lesion 1987   mole excised from vulva   PVD (posterior vitreous detachment), right 09/07/2015    Past Surgical History:  Procedure Laterality Date   ABLATION  11/10/2002   Hysteroscopic Thermal   CATARACT EXTRACTION     cyst on scalp     sebaccous(multiple times as a teen)   EYE SURGERY     gastro surgery  07/31/2010   LAPAROSCOPIC CHOLECYSTECTOMY  11/28/2001   left leg muscle surgery  07/31/2010   OTHER SURGICAL HISTORY     Sebaceous cyst on head removed 7x's   RETINAL DETACHMENT SURGERY       Home Medications:  Prior to Admission medications   Medication Sig Start Date End Date Taking? Authorizing Provider  amLODipine (NORVASC) 5 MG tablet Take 5 mg by mouth daily. 09/15/21  Yes [provider]  carboxymethylcellulose (REFRESH PLUS) 0.5 % SOLN Place 1 drop into both eyes daily as needed (for dry eye).   Yes [provider]  cholecalciferol (VITAMIN D) 1000 UNITS tablet Take 1,000 Units by mouth daily.   Yes [provider]  cyanocobalamin (VITAMIN B12) 1000 MCG tablet Take 1,000 mcg by mouth every other day.   Yes [provider]  Estradiol 10 MCG TABS vaginal tablet Place 1 tablet (10 mcg total) vaginally 2 (two) times a week. 03/23/22  Yes Salvadore Dom, MD  famotidine (PEPCID) 40 MG tablet Take 40 mg by mouth daily. 09/27/22  Yes [provider]  metroNIDAZOLE (METROGEL) 0.75 % gel Apply 1 Application topically daily.   Yes [provider]  Multiple Vitamins-Minerals (WOMENS MULTIVITAMIN PLUS PO) Take 1 tablet by mouth daily.   Yes [provider]  Omega 3 1200 MG CAPS Take 2 capsules by mouth daily.   Yes [provider]  tretinoin (RETIN-A) 0.025 % cream  Apply 1 application  topically at bedtime. 06/30/21  Yes [provider]  UNABLE TO FIND Take 2 capsules by mouth at bedtime. Bone Up - calcium supplement   Yes [provider]  COVID-19 mRNA vaccine 2023-2024 (COMIRNATY) syringe Inject into the muscle. 05/23/22   Carlyle Basques, MD    Inpatient Medications: Scheduled Meds:  [START ON 10/03/2022]  stroke: early stages of recovery book   Does not apply Once   aspirin EC  81 mg Oral Daily   clopidogrel  75 mg Oral Daily   enoxaparin (LOVENOX) injection  40 mg Subcutaneous Q24H   famotidine  40 mg Oral Daily   rosuvastatin  20 mg Oral Daily   Continuous Infusions:  PRN Meds: acetaminophen  Allergies:    Allergies  Allergen Reactions   Other Other (See Comments)    Potassium dichromate, broke out in dry rashes   Penicillins Swelling    Throat swelling - hasn't had since was a teen   Gold Sodium Thiosulfate Rash   Nickel Rash    Social History:   Social History   Socioeconomic History   Marital status: Married    Spouse name: Not on file   Number of children: 2   Years of education: M.ED.   Highest education level: Not on file  Occupational History    Comment: [employer]  Tobacco Use   Smoking status: Never   Smokeless tobacco: Never  Vaping Use   Vaping Use: Never used  Substance and Sexual Activity   Alcohol use: Yes    Alcohol/week: 3.0 standard drinks of alcohol    Types: 3 Glasses of wine per week   Drug use: No   Sexual activity: Not Currently    Partners: Male    Birth control/protection: Post-menopausal  Other Topics Concern   Not on file  Social History Narrative   2 cups of coffee a day    Social Determinants of Health   Financial Resource Strain: Not on file  Food Insecurity: Not on file  Transportation Needs: Not on file  Physical Activity: Not on file  Stress: Not on file  Social Connections: Not on file  Intimate Partner Violence: Not on file    Family History:     Family History  Problem Relation Age of Onset   Colon cancer Father 56   Alzheimer's disease Father    Emphysema Father    COPD Father    Heart attack Mother    Cancer Brother        peritod gland cancer   Breast cancer Maternal Aunt        post menopause  ROS:  Please see the history of present illness.  General:no colds or fevers, no weight changes Skin:no rashes or ulcers HEENT:no blurred vision, no congestion CV:see HPI PUL:see HPI GI:no diarrhea constipation or melena, no indigestion GU:no hematuria, no dysuria MS:no joint pain, no claudication  numbness in both lower est Neuro:no syncope, no lightheadedness Endo:no diabetes, no thyroid disease  All other ROS reviewed and negative.     Physical Exam/Data:   Vitals:   10/02/22 0249 10/02/22 0334 10/02/22 0734 10/02/22 1143  BP:  (!) 145/70 127/64 (!) 137/56  Pulse:  73 68 81  Resp:  '18 16 16  '$ Temp: 97.9 F (36.6 C) 98.2 F (36.8 C) (!) 97.5 F (36.4 C) 98.1 F (36.7 C)  TempSrc: Oral Oral Oral Oral  SpO2:  96% 98% 98%  Weight:      Height:       No intake or output data in the 24 hours ending 10/02/22 1501    10/01/2022    4:42 PM 06/14/2022    2:37 PM 03/14/2022    2:20 PM  Last 3 Weights  Weight (lbs) 170 lb 10.2 oz 166 lb 169 lb  Weight (kg) 77.4 kg 75.297 kg 76.658 kg     Body mass index is 28.84 kg/m.  General:  Well nourished, well developed, in no acute distress HEENT: normal Neck: no JVD Vascular: No carotid bruits; Distal pulses 2+ bilaterally Cardiac:  normal S1, S2; RRR; no murmur gallup rub or click Lungs:  clear to auscultation bilaterally, no wheezing, rhonchi or rales  Abd: soft, nontender, no hepatomegaly  Ext: no edema Musculoskeletal:  No deformities, BUE and BLE strength normal and equal Skin: warm and dry  Neuro:  alert and oriented X 3 MAE follows commands no focal abnormalities noted Psych:  Normal affect    Relevant CV Studies: Cardiac cath 2012 ANGIOGRAPHIC  FINDINGS: 1. The left main is a short vessel that trifurcates into an LAD. 2. Ramus intermedius.  It is a large branching ramus intermedius and a     circumflex vessel, which is essentially a large obtuse marginal     branch. 3. The LAD is a moderate-to-large caliber vessel.  It gives rise to     mostly the large septal trunk and then another diagonal branch and     reaches down toward the apex.  There is no significant disease in     the LAD. 4. The ramus is a large caliber vessel, almost greater than the LAD     itself.  It bifurcates halfway down and branches into several small     branches distally and reaches almost down to the apex.  No disease     noted. 5. The circumflex is again mostly an obtuse marginal branch, which     trifurcates down to its bottom with a very small posterolateral     branch with not much in the way of an atrioventricular groove     vessel.  There is no significant disease in this vessel either. 6. The right coronary artery is a dominant vessel.  There is no     disease down to the crux and then bifurcates into the     posterolateral system of the right atrioventricular groove vessel     branching into 2 posterolateral branches.  No disease in the distal     portion of the vessel either.  The right posterior descending     artery also is a __________ large-sized vessel with  no significant     disease.   IMPRESSION: 1. No angiographic evidence of any coronary artery disease to explain     the abnormal EKG or stress test results. 2. Preserved left ventricular ejection fraction with normal end-     diastolic pressure.   PLAN:  Standard post cath care and we will discharge the patient today. After bedrest, she will follow up with me in clinic just for postcatheterization followup.  We will continue with current medications.    ECHO 10/02/22 IMPRESSIONS     1. Difficult acoustic windows. Hypokkinesis of the mid/distal septal  wall, distal lateral.  Hypokinesis of the inferior wall and apex. . Left  ventricular ejection fraction, by estimation, is 35%. The left ventricle  has severely decreased function. There  is mild left ventricular hypertrophy. Left ventricular diastolic  parameters are consistent with Grade I diastolic dysfunction (impaired  relaxation).   2. Right ventricular systolic function is normal. The right ventricular  size is normal.   3. Trivial mitral valve regurgitation.   4. The aortic valve is tricuspid. Aortic valve regurgitation is not  visualized.   5. The inferior vena cava is normal in size with greater than 50%  respiratory variability, suggesting right atrial pressure of 3 mmHg.   FINDINGS   Left Ventricle: Difficult acoustic windows. Hypokkinesis of the  mid/distal septal wall, distal lateral. Hypokinesis of the inferior wall  and apex. Left ventricular ejection fraction, by estimation, is 35%. The  left ventricle has severely decreased  function. The left ventricular internal cavity size was normal in size.  There is mild left ventricular hypertrophy. Left ventricular diastolic  parameters are consistent with Grade I diastolic dysfunction (impaired  relaxation).   Right Ventricle: The right ventricular size is normal. Right vetricular  wall thickness was not assessed. Right ventricular systolic function is  normal.   Left Atrium: Left atrial size was normal in size.   Right Atrium: Right atrial size was normal in size.   Pericardium: There is no evidence of pericardial effusion.   Mitral Valve: There is mild thickening of the mitral valve leaflet(s).  Mild mitral annular calcification. Trivial mitral valve regurgitation.   Tricuspid Valve: The tricuspid valve is grossly normal. Tricuspid valve  regurgitation is not demonstrated.   Aortic Valve: The aortic valve is tricuspid. Aortic valve regurgitation is  not visualized. Aortic valve mean gradient measures 3.0 mmHg. Aortic valve  peak  gradient measures 5.1 mmHg. Aortic valve area, by VTI measures 3.12  cm.   Pulmonic Valve: The pulmonic valve was not well visualized. Pulmonic valve  regurgitation is trivial. No evidence of pulmonic stenosis.   Aorta: The aortic root is normal in size and structure.   Venous: The inferior vena cava is normal in size with greater than 50%  respiratory variability, suggesting right atrial pressure of 3 mmHg.   IAS/Shunts: No atrial level shunt detected by color flow Doppler.    Laboratory Data:  High Sensitivity Troponin:  No results for input(s): "TROPONINIHS" in the last 720 hours.   Chemistry Recent Labs  Lab 10/01/22 1700  NA 141  K 3.6  CL 105  CO2 26  GLUCOSE 119*  BUN 17  CREATININE 0.92  CALCIUM 10.9*  GFRNONAA >60  ANIONGAP 10    Recent Labs  Lab 10/01/22 1700  PROT 7.9  ALBUMIN 4.8  AST 32  ALT 46*  ALKPHOS 89  BILITOT 0.4   Lipids  Recent Labs  Lab 10/02/22 0245  CHOL 206*  TRIG 72  HDL 68  LDLCALC 124*  CHOLHDL 3.0    Hematology Recent Labs  Lab 10/01/22 1700  WBC 8.0  RBC 4.83  HGB 14.5  HCT 42.2  MCV 87.4  MCH 30.0  MCHC 34.4  RDW 13.1  PLT 297   Thyroid No results for input(s): "TSH", "FREET4" in the last 168 hours.  BNPNo results for input(s): "BNP", "PROBNP" in the last 168 hours.  DDimer No results for input(s): "DDIMER" in the last 168 hours.   Radiology/Studies:  ECHOCARDIOGRAM COMPLETE  Result Date: 10/02/2022    ECHOCARDIOGRAM REPORT   Patient Name:   Erika Bolton Date of Exam: 10/02/2022 Medical Rec #:  [id number]      Height:       64.5 in Accession #:    [id number]     Weight:       170.6 lb Date of Birth:  12/25/1955      BSA:          1.838 m Patient Age:    30 years       BP:           127/64 mmHg Patient Gender: F              HR:           74 bpm. Exam Location:  Inpatient Procedure: 2D Echo Indications:    TIA  History:        Patient has no prior history of Echocardiogram examinations.                 TIA; Risk  Factors:Hypertension.  Sonographer:    Harvie Junior Referring Phys: Shela Leff  Sonographer Comments: Technically difficult study due to poor echo windows. IMPRESSIONS  1. Difficult acoustic windows. Hypokkinesis of the mid/distal septal wall, distal lateral. Hypokinesis of the inferior wall and apex. . Left ventricular ejection fraction, by estimation, is 35%. The left ventricle has severely decreased function. There is mild left ventricular hypertrophy. Left ventricular diastolic parameters are consistent with Grade I diastolic dysfunction (impaired relaxation).  2. Right ventricular systolic function is normal. The right ventricular size is normal.  3. Trivial mitral valve regurgitation.  4. The aortic valve is tricuspid. Aortic valve regurgitation is not visualized.  5. The inferior vena cava is normal in size with greater than 50% respiratory variability, suggesting right atrial pressure of 3 mmHg. FINDINGS  Left Ventricle: Difficult acoustic windows. Hypokkinesis of the mid/distal septal wall, distal lateral. Hypokinesis of the inferior wall and apex. Left ventricular ejection fraction, by estimation, is 35%. The left ventricle has severely decreased function. The left ventricular internal cavity size was normal in size. There is mild left ventricular hypertrophy. Left ventricular diastolic parameters are consistent with Grade I diastolic dysfunction (impaired relaxation). Right Ventricle: The right ventricular size is normal. Right vetricular wall thickness was not assessed. Right ventricular systolic function is normal. Left Atrium: Left atrial size was normal in size. Right Atrium: Right atrial size was normal in size. Pericardium: There is no evidence of pericardial effusion. Mitral Valve: There is mild thickening of the mitral valve leaflet(s). Mild mitral annular calcification. Trivial mitral valve regurgitation. Tricuspid Valve: The tricuspid valve is grossly normal. Tricuspid valve regurgitation  is not demonstrated. Aortic Valve: The aortic valve is tricuspid. Aortic valve regurgitation is not visualized. Aortic valve mean gradient measures 3.0 mmHg. Aortic valve peak gradient measures 5.1 mmHg. Aortic valve area, by VTI measures 3.12 cm. Pulmonic Valve: The pulmonic valve was  not well visualized. Pulmonic valve regurgitation is trivial. No evidence of pulmonic stenosis. Aorta: The aortic root is normal in size and structure. Venous: The inferior vena cava is normal in size with greater than 50% respiratory variability, suggesting right atrial pressure of 3 mmHg. IAS/Shunts: No atrial level shunt detected by color flow Doppler.  LEFT VENTRICLE PLAX 2D LVIDd:         5.20 cm      Diastology LVIDs:         3.70 cm      LV e' medial:    5.00 cm/s LV PW:         0.90 cm      LV E/e' medial:  12.2 LV IVS:        0.90 cm      LV e' lateral:   6.58 cm/s LVOT diam:     2.30 cm      LV E/e' lateral: 9.2 LV SV:         73 LV SV Index:   40 LVOT Area:     4.15 cm                              3D Volume EF: LV Volumes (MOD)            3D EF:        45 % LV vol d, MOD A2C: 142.0 ml LV EDV:       149 ml LV vol d, MOD A4C: 92.4 ml  LV ESV:       81 ml LV vol s, MOD A2C: 64.4 ml  LV SV:        68 ml LV vol s, MOD A4C: 51.3 ml LV SV MOD A2C:     77.6 ml LV SV MOD A4C:     92.4 ml LV SV MOD BP:      63.0 ml RIGHT VENTRICLE RV Basal diam:  2.60 cm RV Mid diam:    2.40 cm RV S prime:     11.90 cm/s TAPSE (M-mode): 2.0 cm LEFT ATRIUM           Index        RIGHT ATRIUM          Index LA diam:      3.40 cm 1.85 cm/m   RA Area:     8.87 cm LA Vol (A4C): 55.5 ml 30.19 ml/m  RA Volume:   15.70 ml 8.54 ml/m  AORTIC VALVE                    PULMONIC VALVE AV Area (Vmax):    3.18 cm     PV Vmax:       1.02 m/s AV Area (Vmean):   3.12 cm     PV Peak grad:  4.2 mmHg AV Area (VTI):     3.12 cm AV Vmax:           113.00 cm/s AV Vmean:          74.900 cm/s AV VTI:            0.233 m AV Peak Grad:      5.1 mmHg AV Mean Grad:       3.0 mmHg LVOT Vmax:         86.40 cm/s LVOT Vmean:        56.200 cm/s LVOT VTI:  0.175 m LVOT/AV VTI ratio: 0.75  AORTA Ao Root diam: 3.50 cm Ao Asc diam:  3.30 cm MITRAL VALVE MV Area (PHT): 3.91 cm    SHUNTS MV Decel Time: 194 msec    Systemic VTI:  0.18 m MV E velocity: 60.80 cm/s  Systemic Diam: 2.30 cm MV A velocity: 80.80 cm/s MV E/A ratio:  0.75 Dorris Carnes MD Electronically signed by Dorris Carnes MD Signature Date/Time: 10/02/2022/1:51:21 PM    Final    MR Cervical Spine Wo Contrast  Result Date: 10/02/2022 CLINICAL DATA:  Initial evaluation for acute myelopathy. EXAM: MRI CERVICAL SPINE WITHOUT CONTRAST TECHNIQUE: Multiplanar, multisequence MR imaging of the cervical spine was performed. No intravenous contrast was administered. COMPARISON:  Prior MRI from 09/07/2021. FINDINGS: Alignment: Straightening with slight reversal of the normal cervical lordosis. Trace degenerative retrolisthesis of C5 on C6. Vertebrae: Vertebral body height maintained without acute or chronic fracture. Bone marrow signal intensity within normal limits. Few subcentimeter benign hemangiomata noted. No worrisome osseous lesions. No abnormal marrow edema. Cord: Normal signal and morphology. Posterior Fossa, vertebral arteries, paraspinal tissues: Unremarkable. Disc levels: C2-C3: Small central disc protrusion with annular fissure indents the ventral thecal sac. No spinal stenosis or cord impingement. Foramina remain patent. C3-C4: Small central disc protrusion with annular fissure indents the ventral thecal sac. No significant spinal stenosis or cord impingement. Foramina remain patent. C4-C5: Disc desiccation with mild disc bulge and uncovertebral spurring. Superimposed small central disc protrusion with annular fissure indents the ventral thecal sac (series 7, image 22). Minimal cord flattening without cord signal changes. Mild spinal stenosis. Foramina remain patent. C5-C6: Trace retrolisthesis. Degenerative intervertebral  disc space narrowing with diffuse disc osteophyte complex. Broad posterior component flattens and effaces the ventral thecal sac. Mild cord flattening without cord signal changes. Moderate to severe spinal stenosis with the thecal sac measuring 7 mm in AP diameter. Foramina remain patent. C6-C7: Right paracentral disc extrusion with superior migration (series 7, image 30). For disc material contacts and minimally flattens the ventral cord. No cord signal changes. Moderate spinal stenosis. Foramina remain patent. C7-T1:  Unremarkable. IMPRESSION: 1. Normal MRI appearance of the cervical spinal cord. No cord signal changes to suggest myelopathy. No other acute abnormality. 2. Degenerative disc osteophyte at C5-6 with resultant moderate to severe spinal stenosis. 3. Right paracentral disc extrusion with superior migration at C6-7 with resultant moderate spinal stenosis. 4. Small central disc protrusion at C4-5 with resultant mild spinal stenosis. Electronically Signed   By: Jeannine Boga M.D.   On: 10/02/2022 01:44   MR BRAIN WO CONTRAST  Result Date: 10/02/2022 CLINICAL DATA:  Initial evaluation for neuro deficit, stroke suspected. EXAM: MRI HEAD WITHOUT CONTRAST TECHNIQUE: Multiplanar, multiecho pulse sequences of the brain and surrounding structures were obtained without intravenous contrast. COMPARISON:  Prior CTs from 10/01/2022. FINDINGS: Brain: Cerebral volume within normal limits. Minimal scattered chronic microvascular ischemic disease noted involving the supratentorial cerebral white matter. Small remote left cerebellar infarct. No evidence for acute or subacute ischemia. Gray-white matter differentiation maintained. Orders of chronic cortical infarction. No acute intracranial hemorrhage. Single punctate chronic microhemorrhage noted within the right thalamus. No other chronic intracranial blood products. No mass lesion, midline shift or mass effect. No hydrocephalus or extra-axial fluid collection.  Pituitary gland and suprasellar region within normal limits. Vascular: Major intracranial vascular flow voids are maintained. Skull and upper cervical spine: Craniocervical junction normal. Bone marrow signal intensity within normal limits. No scalp soft tissue abnormality. Sinuses/Orbits: Prior bilateral ocular lens replacement. Paranasal sinuses  are clear. No mastoid effusion. Other: None. IMPRESSION: 1. No acute intracranial abnormality. 2. Small remote left cerebellar infarct. 3. Underlying mild chronic microvascular ischemic disease for age. Electronically Signed   By: Jeannine Boga M.D.   On: 10/02/2022 01:38   CT Angio Head Neck W WO CM  Result Date: 10/01/2022 CLINICAL DATA:  Initial evaluation for right-sided hemiparesis., which EXAM: CT ANGIOGRAPHY HEAD AND NECK TECHNIQUE: Multidetector CT imaging of the head and neck was performed using the standard protocol during bolus administration of intravenous contrast. Multiplanar CT image reconstructions and MIPs were obtained to evaluate the vascular anatomy. Carotid stenosis measurements (when applicable) are obtained utilizing NASCET criteria, using the distal internal carotid diameter as the denominator. RADIATION DOSE REDUCTION: This exam was performed according to the departmental dose-optimization program which includes automated exposure control, adjustment of the mA and/or kV according to patient size and/or use of iterative reconstruction technique. CONTRAST:  46m OMNIPAQUE IOHEXOL 350 MG/ML SOLN COMPARISON:  Prior head CT from earlier the same day. FINDINGS: CTA NECK FINDINGS Aortic arch: Visualized aortic arch normal caliber with standard 3 vessel morphology. No stenosis about the origin of the great vessels. Right carotid system: Right common and internal carotid arteries widely patent without stenosis, dissection or occlusion. Left carotid system: Left common and internal carotid arteries widely patent without stenosis, dissection or  occlusion. Vertebral arteries: Both vertebral arteries arise from the subclavian arteries. No proximal subclavian artery stenosis. Both vertebral arteries widely patent without stenosis, dissection or occlusion. Skeleton: No discrete or worrisome osseous lesions. Moderate spondylosis noted at C5-6. Other neck: No other acute soft tissue abnormality within the neck. Upper chest: Visualized upper chest demonstrates no acute finding. Review of the MIP images confirms the above findings CTA HEAD FINDINGS Anterior circulation: Both internal carotid arteries widely patent to the termini without stenosis. A1 segments widely patent. Normal anterior communicating artery complex. Both anterior cerebral arteries widely patent to their distal aspects without stenosis. No M1 stenosis or occlusion. Normal MCA bifurcations. Distal MCA branches well perfused and symmetric. Posterior circulation: Both V4 segments patent to the vertebrobasilar junction without stenosis. Both PICA origins patent and normal. Basilar widely patent to its distal aspect without stenosis. Superior cerebellar arteries patent bilaterally. Both PCAs primarily supplied via the basilar and are well perfused to there distal aspects. Venous sinuses: Patent allowing for timing the contrast bolus. Anatomic variants: None significant.  No aneurysm. Review of the MIP images confirms the above findings IMPRESSION: Normal CTA of the head and neck. No large vessel occlusion or other emergent finding. No hemodynamically significant or correctable stenosis. Electronically Signed   By: BJeannine BogaM.D.   On: 10/01/2022 20:11   CT HEAD CODE STROKE WO CONTRAST  Result Date: 10/01/2022 CLINICAL DATA:  Code stroke.  Right facial numbness EXAM: CT HEAD WITHOUT CONTRAST TECHNIQUE: Contiguous axial images were obtained from the base of the skull through the vertex without intravenous contrast. RADIATION DOSE REDUCTION: This exam was performed according to the  departmental dose-optimization program which includes automated exposure control, adjustment of the mA and/or kV according to patient size and/or use of iterative reconstruction technique. COMPARISON:  Brain MRI 05/10/2007 FINDINGS: Brain: There is no acute intracranial hemorrhage, extra-axial fluid collection, or acute infarct. Parenchymal volume is normal. The ventricles are normal in size. Gray-white differentiation is preserved. 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 A partially empty sella is noted, nonspecific. There is no solid mass lesion. There is no mass effect  or midline shift. Vascular: No dense vessel is seen. Skull: Normal. Negative for fracture or focal lesion. Sinuses/Orbits: The imaged paranasal sinuses are clear. Bilateral lens implants are in place. The globes and orbits are otherwise unremarkable. ASPECTS [facility] Stroke Program Early CT Score) - Ganglionic level infarction (caudate, lentiform nuclei, internal capsule, insula, M1-M3 cortex): 7 - Supraganglionic infarction (M4-M6 cortex): 3 Total score (0-10 with 10 being normal): 10 IMPRESSION: No acute intracranial pathology. Electronically Signed   By: Valetta Mole M.D.   On: 10/01/2022 17:07     Assessment and Plan:   Cardiomyopathy new from 2012.  now with TIA add GDMT once BP meds allowed she has no angina but some dyspnea with exertion, she slows her exercise for this. she is euvolemic consider cardiac cath vs outpt cardiac CTA.  No awareness of rapid HR.  Here SR HTN permissive hypertension currently would add ARB or entresto first then BB   TIA on asa 81 mg and plavix  HLD on crestor goal LDL <70   Risk Assessment/Risk Scores:     For questions or updates, please contact [place] Please consult [email or web address] for contact info under    Signed, Cecilie Kicks, NP  10/02/2022 3:01 PM  Patient seen and examined. Agree with assessment and plan.  Ms. Rie Wendler is a 67 year old female  who has a history of chronic left bundle branch block, hypertension, GERD, cervical spine stenosis.  In 2012, she had undergone definitive cardiac catheterization after a nuclear study was interpreted as abnormal.  She was found to have normal coronary arteries.  An echo at that time showed septal wall motion abnormality consistent with her left bundle branch block.  Patient was currently admitted last evening after experiencing right-sided numbness on her face arm and legs.  Blood pressure was elevated at 169/71.  Head CT was negative for acute abnormality.  CTA of head and neck was negative.  She was felt to have a TIA.  MRI of brain showed findings consistent with a small remote left cerebellar infarct.  There was underlying mild chronic microvascular ischemic changes for age.  Presently, she feels well.  She denies any chest pain or significant shortness of breath.  However retrospectively she may have noticed some very minimal change in exercise tolerance over the past month.  On exam, blood pressure is 13/56 pulse is 100.  HEENT is unremarkable.  There is no JVD.  Lungs are clear.  Rhythm is regular.  Trace systolic murmur.  Abdomen soft nontender.   pulses are 2+.  There was no clubbing cyanosis or edema.  ECG shows sinus tachycardia at 108 bpm left bundle branch block.  I discussed my concern with her reduction of LV function.  This may be mediated by her chronic left bundle branch block.  However she had noted some perhaps mild recent change in exercise tolerance although even yesterday she went to the gym and walk 2 miles.  Will initiate low-dose metoprolol succinate this evening.  In light of her LV dysfunction recommend coronary CTA evaluation for reassessment of coronary anatomy.  In 2012 she had normal coronary arteries.  Will try to set up for tomorrow if schedule allows unless advised against by neurology or neurohospitalist.   Troy Sine, MD, [facility] 10/02/2022 5:20 PM

## 2022-10-02 NOTE — Progress Notes (Addendum)
STROKE TEAM PROGRESS NOTE   INTERVAL HISTORY Her husband is at the bedside.  She just received a call to schedule EMG for her lower extremities outpatient which she will do ASAP. She is also planning on following up with Dr Felecia Shelling in the next couple of weeks as well. Discussed her stroke risk factors including her cholesterol and LDL and she is willing to be on crestor. She is still having numbness/tingling in her bilateral lower extremities that has been occurring for the last year.   Vitals:   10/02/22 0115 10/02/22 0249 10/02/22 0334 10/02/22 0734  BP: 139/78  (!) 145/70 127/64  Pulse: 79  73 68  Resp:   18 16  Temp:  97.9 F (36.6 C) 98.2 F (36.8 C) (!) 97.5 F (36.4 C)  TempSrc:  Oral Oral Oral  SpO2: 95%  96% 98%  Weight:      Height:       CBC:  Recent Labs  Lab 10/01/22 1700  WBC 8.0  NEUTROABS 5.2  HGB 14.5  HCT 42.2  MCV 87.4  PLT 123XX123   Basic Metabolic Panel:  Recent Labs  Lab 10/01/22 1700  NA 141  K 3.6  CL 105  CO2 26  GLUCOSE 119*  BUN 17  CREATININE 0.92  CALCIUM 10.9*   Lipid Panel:  Recent Labs  Lab 10/02/22 0245  CHOL 206*  TRIG 72  HDL 68  CHOLHDL 3.0  VLDL 14  LDLCALC 124*   HgbA1c: No results for input(s): "HGBA1C" in the last 168 hours. Urine Drug Screen:  Recent Labs  Lab 10/01/22 1730  LABOPIA NONE DETECTED  COCAINSCRNUR NONE DETECTED  LABBENZ NONE DETECTED  AMPHETMU NONE DETECTED  THCU NONE DETECTED  LABBARB NONE DETECTED    Alcohol Level  Recent Labs  Lab 10/01/22 1700  ETH <10    IMAGING past 24 hours MR Cervical Spine Wo Contrast  Result Date: 10/02/2022 CLINICAL DATA:  Initial evaluation for acute myelopathy. EXAM: MRI CERVICAL SPINE WITHOUT CONTRAST TECHNIQUE: Multiplanar, multisequence MR imaging of the cervical spine was performed. No intravenous contrast was administered. COMPARISON:  Prior MRI from 09/07/2021. FINDINGS: Alignment: Straightening with slight reversal of the normal cervical lordosis. Trace  degenerative retrolisthesis of C5 on C6. Vertebrae: Vertebral body height maintained without acute or chronic fracture. Bone marrow signal intensity within normal limits. Few subcentimeter benign hemangiomata noted. No worrisome osseous lesions. No abnormal marrow edema. Cord: Normal signal and morphology. Posterior Fossa, vertebral arteries, paraspinal tissues: Unremarkable. Disc levels: C2-C3: Small central disc protrusion with annular fissure indents the ventral thecal sac. No spinal stenosis or cord impingement. Foramina remain patent. C3-C4: Small central disc protrusion with annular fissure indents the ventral thecal sac. No significant spinal stenosis or cord impingement. Foramina remain patent. C4-C5: Disc desiccation with mild disc bulge and uncovertebral spurring. Superimposed small central disc protrusion with annular fissure indents the ventral thecal sac (series 7, image 22). Minimal cord flattening without cord signal changes. Mild spinal stenosis. Foramina remain patent. C5-C6: Trace retrolisthesis. Degenerative intervertebral disc space narrowing with diffuse disc osteophyte complex. Broad posterior component flattens and effaces the ventral thecal sac. Mild cord flattening without cord signal changes. Moderate to severe spinal stenosis with the thecal sac measuring 7 mm in AP diameter. Foramina remain patent. C6-C7: Right paracentral disc extrusion with superior migration (series 7, image 30). For disc material contacts and minimally flattens the ventral cord. No cord signal changes. Moderate spinal stenosis. Foramina remain patent. C7-T1:  Unremarkable. IMPRESSION: 1. Normal  MRI appearance of the cervical spinal cord. No cord signal changes to suggest myelopathy. No other acute abnormality. 2. Degenerative disc osteophyte at C5-6 with resultant moderate to severe spinal stenosis. 3. Right paracentral disc extrusion with superior migration at C6-7 with resultant moderate spinal stenosis. 4. Small  central disc protrusion at C4-5 with resultant mild spinal stenosis. Electronically Signed   By: Jeannine Boga M.D.   On: 10/02/2022 01:44   MR BRAIN WO CONTRAST  Result Date: 10/02/2022 CLINICAL DATA:  Initial evaluation for neuro deficit, stroke suspected. EXAM: MRI HEAD WITHOUT CONTRAST TECHNIQUE: Multiplanar, multiecho pulse sequences of the brain and surrounding structures were obtained without intravenous contrast. COMPARISON:  Prior CTs from 10/01/2022. FINDINGS: Brain: Cerebral volume within normal limits. Minimal scattered chronic microvascular ischemic disease noted involving the supratentorial cerebral white matter. Small remote left cerebellar infarct. No evidence for acute or subacute ischemia. Gray-white matter differentiation maintained. Orders of chronic cortical infarction. No acute intracranial hemorrhage. Single punctate chronic microhemorrhage noted within the right thalamus. No other chronic intracranial blood products. No mass lesion, midline shift or mass effect. No hydrocephalus or extra-axial fluid collection. Pituitary gland and suprasellar region within normal limits. Vascular: Major intracranial vascular flow voids are maintained. Skull and upper cervical spine: Craniocervical junction normal. Bone marrow signal intensity within normal limits. No scalp soft tissue abnormality. Sinuses/Orbits: Prior bilateral ocular lens replacement. Paranasal sinuses are clear. No mastoid effusion. Other: None. IMPRESSION: 1. No acute intracranial abnormality. 2. Small remote left cerebellar infarct. 3. Underlying mild chronic microvascular ischemic disease for age. Electronically Signed   By: Jeannine Boga M.D.   On: 10/02/2022 01:38   CT Angio Head Neck W WO CM  Result Date: 10/01/2022 CLINICAL DATA:  Initial evaluation for right-sided hemiparesis., which EXAM: CT ANGIOGRAPHY HEAD AND NECK TECHNIQUE: Multidetector CT imaging of the head and neck was performed using the standard  protocol during bolus administration of intravenous contrast. Multiplanar CT image reconstructions and MIPs were obtained to evaluate the vascular anatomy. Carotid stenosis measurements (when applicable) are obtained utilizing NASCET criteria, using the distal internal carotid diameter as the denominator. RADIATION DOSE REDUCTION: This exam was performed according to the departmental dose-optimization program which includes automated exposure control, adjustment of the mA and/or kV according to patient size and/or use of iterative reconstruction technique. CONTRAST:  76m OMNIPAQUE IOHEXOL 350 MG/ML SOLN COMPARISON:  Prior head CT from earlier the same day. FINDINGS: CTA NECK FINDINGS Aortic arch: Visualized aortic arch normal caliber with standard 3 vessel morphology. No stenosis about the origin of the great vessels. Right carotid system: Right common and internal carotid arteries widely patent without stenosis, dissection or occlusion. Left carotid system: Left common and internal carotid arteries widely patent without stenosis, dissection or occlusion. Vertebral arteries: Both vertebral arteries arise from the subclavian arteries. No proximal subclavian artery stenosis. Both vertebral arteries widely patent without stenosis, dissection or occlusion. Skeleton: No discrete or worrisome osseous lesions. Moderate spondylosis noted at C5-6. Other neck: No other acute soft tissue abnormality within the neck. Upper chest: Visualized upper chest demonstrates no acute finding. Review of the MIP images confirms the above findings CTA HEAD FINDINGS Anterior circulation: Both internal carotid arteries widely patent to the termini without stenosis. A1 segments widely patent. Normal anterior communicating artery complex. Both anterior cerebral arteries widely patent to their distal aspects without stenosis. No M1 stenosis or occlusion. Normal MCA bifurcations. Distal MCA branches well perfused and symmetric. Posterior  circulation: Both V4 segments patent to the vertebrobasilar junction  without stenosis. Both PICA origins patent and normal. Basilar widely patent to its distal aspect without stenosis. Superior cerebellar arteries patent bilaterally. Both PCAs primarily supplied via the basilar and are well perfused to there distal aspects. Venous sinuses: Patent allowing for timing the contrast bolus. Anatomic variants: None significant.  No aneurysm. Review of the MIP images confirms the above findings IMPRESSION: Normal CTA of the head and neck. No large vessel occlusion or other emergent finding. No hemodynamically significant or correctable stenosis. Electronically Signed   By: Jeannine Boga M.D.   On: 10/01/2022 20:11   CT HEAD CODE STROKE WO CONTRAST  Result Date: 10/01/2022 CLINICAL DATA:  Code stroke.  Right facial numbness EXAM: CT HEAD WITHOUT CONTRAST TECHNIQUE: Contiguous axial images were obtained from the base of the skull through the vertex without intravenous contrast. RADIATION DOSE REDUCTION: This exam was performed according to the departmental dose-optimization program which includes automated exposure control, adjustment of the mA and/or kV according to patient size and/or use of iterative reconstruction technique. COMPARISON:  Brain MRI 05/10/2007 FINDINGS: Brain: There is no acute intracranial hemorrhage, extra-axial fluid collection, or acute infarct. Parenchymal volume is normal. The ventricles are normal in size. Gray-white differentiation is preserved. 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 A partially empty sella is noted, nonspecific. There is no solid mass lesion. There is no mass effect or midline shift. Vascular: No dense vessel is seen. Skull: Normal. Negative for fracture or focal lesion. Sinuses/Orbits: The imaged paranasal sinuses are clear. Bilateral lens implants are in place. The globes and orbits are otherwise unremarkable. ASPECTS [facility] Stroke Program  Early CT Score) - Ganglionic level infarction (caudate, lentiform nuclei, internal capsule, insula, M1-M3 cortex): 7 - Supraganglionic infarction (M4-M6 cortex): 3 Total score (0-10 with 10 being normal): 10 IMPRESSION: No acute intracranial pathology. Electronically Signed   By: Valetta Mole M.D.   On: 10/01/2022 17:07    PHYSICAL EXAM  Physical Exam  Constitutional: Appears well-developed and well-nourished.   Cardiovascular: Normal rate and regular rhythm.  Respiratory: Effort normal, non-labored breathing  Neuro: Mental Status: Patient is awake, alert, oriented to person, place, month, year, and situation. Patient is able to give a clear and coherent history. No signs of aphasia or neglect Cranial Nerves: II: Visual Fields are full. Pupils are equal, round, and reactive to light.   III,IV, VI: EOMI without ptosis or diploplia.  V: Facial sensation is symmetric to temperature VII: Facial movement is symmetric resting and smiling VIII: Hearing is intact to voice X: Palate elevates symmetrically XI: Shoulder shrug is symmetric. XII: Tongue protrudes midline without atrophy or fasciculations.  Motor: Tone is normal. Bulk is normal. 5/5 strength was present in all four extremities.  Sensory: Subjective diminished sensation in bilateral lower extremities. DDS intact  Cerebellar: FNF and HKS are intact bilaterally    ASSESSMENT/PLAN Erika Bolton is a 67 y.o. female with history of cervical spine stenosis, left bundle branch block, hypertension, GERD, MGUS, cervical spine stenosis, and fatty liver presenting with right sided sensory symptoms.  She was in her usual state of health today, with intermittent bilateral leg numbness aggravated by standing that has been ongoing for the past approximately 1 year (had nearly resolved but then recurrent again in the past 2 months or so).   She did note that this bilateral leg numbness did feel somewhat worse after she walked 2 miles at the  gym.  However on returning home at 3:30 PM she noted  right face arm and leg numbness that she has not ever had before   TIA vs. anxiety Pt still feel some slight right facial sensation decrease Code Stroke CT head No acute abnormality.  CTA head & neck Normal CTA of the head and neck.  MRI  No acute intracranial abnormality. Small remote left cerebellar infarct. 2D Echo EF 35% mild LVH LDL 124 HgbA1c pending VTE prophylaxis - Lovenox No antithrombotic prior to admission, now on aspirin 81 mg daily and clopidogrel 75 mg daily for 3 weeks and then ASA '81mg'$  alone Therapy recommendations:  no follow up need Disposition:  Home   Cardiomyopathy History of left BBB 2D echo EF 35% No recent 2D echo to compare Patient had cardiac cath 2012 showed normal Cardiology on board Plan for coronary CTA in AM.   Hypertension Home meds:  amlodipine Stable Orthostatic vitals negative for orthostatic hypotension Long-term BP goal normotensive  Hyperlipidemia LDL 124, goal < 70 Add Crestor 20 Continue statin at discharge  Other Stroke Risk Factors   Other Active Problems Bilateral lower extremity numbness MRI C-Spine 1. Normal MRI appearance of the cervical spinal cord. No cord signal changes to suggest myelopathy. No other acute abnormality. 2. Degenerative disc osteophyte at C5-6 with resultant moderate to severe spinal stenosis. 3. Right paracentral disc extrusion with superior migration at C6-7 with resultant moderate spinal stenosis. 4. Small central disc protrusion at C4-5 with resultant mild spinal stenosis." EMG outpatient scheduled per patient  Follow up with Dr. Felecia Shelling in one month  Hospital day # 0  Patient seen and examined by NP/APP with MD. MD to update note as needed.   Janine Ores, DNP, FNP-BC [facility] Pager: [phone]  ATTENDING NOTE: I reviewed above note and agree with the assessment and plan. Pt was seen and examined.   Husband and daughter  at bedside.  Patient stated that she started to have bilateral lower extremity numbness 1 year ago after of physical exercise training, which went away in 6 to 8 weeks.  It reoccurred later with more standing or activity, for which she has been scheduled for EMG soon.  She denies any back pain or neck pain.  Yesterday she walked 2 miles in the gym, had 1 episode of feeding while 20 but resolved.  In abdomen she had a feeling of right foot numbness and spreading to the entire right-sided body.  Symptoms improved overnight, however she still felt right face some numbness and again bilateral lower extremity numbness.  Exam show neuro intact except mild decreased light touch sensation on the right face and bilateral lower extremity.  Etiology of patient's symptoms not quite sure, could be TIA versus anxiety.  Will treat as TIA with DAPT for 3 weeks and then aspirin alone.  Add Crestor 20 for stroke prevention. EF 35% but no previous 2D echo for comparison.  Patient does have history of LBBB.  She had cardiac cath 2012 showed normal study.  Cardiology consulted and plan for coronary CTA tomorrow.  Patient will follow with Dr. Felecia Shelling at [facility] in 4 weeks.   For detailed assessment and plan, please refer to above/below as I have made changes wherever appropriate.   Neurology will sign off. Please call with questions. Pt will follow up with Dr. Felecia Shelling at [facility] in about 4 weeks. Thanks for the consult.   Rosalin Hawking, MD PhD Stroke Neurology 10/02/2022 6:32 PM    To contact Stroke Continuity provider, please refer to [email or web address] After hours, contact General Neurology

## 2022-10-02 NOTE — H&P (Signed)
History and Physical    Erika Bolton [id number] DOB: [birth year] DOA: 10/01/2022  PCP: Lavone Orn, MD  Patient coming from: Home  Chief Complaint: Right-sided numbness  HPI: Erika Bolton is a 67 y.o. female with medical history significant of cervical spine stenosis, left bundle branch block, hypertension, GERD, MGUS presented to [facility] ED with right face, arm, and leg numbness. LKW 3:30 PM today. Blood pressure 169/71 on arrival to the ED.  Labs showing calcium 10.9 (chronically elevated), albumin 4.8,  blood ethanol level undetectable, UDS negative.  CT head negative for acute finding.  CTA head and neck negative for LVO. No tPA given due to mild symptoms. Neurology recommended MRI of brain and C-spine for further evaluation.    MRI brain without contrast showing: "IMPRESSION: 1. No acute intracranial abnormality. 2. Small remote left cerebellar infarct. 3. Underlying mild chronic microvascular ischemic disease for age."  MRI C-spine without contrast showing: "IMPRESSION: 1. Normal MRI appearance of the cervical spinal cord. No cord signal changes to suggest myelopathy. No other acute abnormality. 2. Degenerative disc osteophyte at C5-6 with resultant moderate to severe spinal stenosis. 3. Right paracentral disc extrusion with superior migration at C6-7 with resultant moderate spinal stenosis. 4. Small central disc protrusion at C4-5 with resultant mild spinal stenosis."  Patient reports history of chronic bilateral lower extremity numbness for which she was evaluated by her PCP and nerve conduction study was recommended.  Today around 3:30 PM she experienced acute onset right-sided facial, arm, and leg numbness.  Denies any weakness.  She was not having any difficulty speaking and saw her face in the mirror and did not notice any facial droop.  Denies history of prior stroke/TIA.  Numbness has now significantly improved.  No other complaints.  Denies fevers, cough,  shortness of breath, chest pain, nausea, vomiting, abdominal pain, or diarrhea.  Review of Systems:  Review of Systems  All other systems reviewed and are negative.   Past Medical History:  Diagnosis Date   Colon polyps 2003   Detached retina    Hypertension    Hypertensive retinopathy    Kidney stone 2016   Left bundle branch block 2012   Menorrhagia    MGUS (monoclonal gammopathy of unknown significance)    Plantar fasciitis    Precancerous lesion 1987   mole excised from vulva   PVD (posterior vitreous detachment), right 09/07/2015    Past Surgical History:  Procedure Laterality Date   ABLATION  11/10/2002   Hysteroscopic Thermal   CATARACT EXTRACTION     cyst on scalp     sebaccous(multiple times as a teen)   EYE SURGERY     gastro surgery  07/31/2010   LAPAROSCOPIC CHOLECYSTECTOMY  11/28/2001   left leg muscle surgery  07/31/2010   OTHER SURGICAL HISTORY     Sebaceous cyst on head removed 7x's   RETINAL DETACHMENT SURGERY       reports that she has never smoked. She has never used smokeless tobacco. She reports current alcohol use of about 3.0 standard drinks of alcohol per week. She reports that she does not use drugs.  Allergies  Allergen Reactions   Other Other (See Comments)    Potassium dichromate, broke out in dry rashes   Penicillins Swelling    Throat swelling - hasn't had since was a teen   Gold Sodium Thiosulfate Rash   Nickel Rash    Family History  Problem Relation Age of Onset   Colon cancer Father 28  Alzheimer's disease Father    Emphysema Father    COPD Father    Heart attack Mother    Cancer Brother        peritod gland cancer   Breast cancer Maternal Aunt        post menopause    Prior to Admission medications   Medication Sig Start Date End Date Taking? Authorizing Provider  amLODipine (NORVASC) 5 MG tablet Take 5 mg by mouth daily. 09/15/21  Yes [provider]  carboxymethylcellulose (REFRESH PLUS) 0.5 % SOLN Place  1 drop into both eyes daily as needed (for dry eye).   Yes [provider]  cholecalciferol (VITAMIN D) 1000 UNITS tablet Take 1,000 Units by mouth daily.   Yes [provider]  UNABLE TO FIND Take 2 capsules by mouth at bedtime. Bone Up - calcium supplement   Yes [provider]  COVID-19 mRNA vaccine 2023-2024 (COMIRNATY) syringe Inject into the muscle. 05/23/22   Carlyle Basques, MD  Estradiol 10 MCG TABS vaginal tablet Place 1 tablet (10 mcg total) vaginally 2 (two) times a week. Patient not taking: Reported on 10/02/2022 03/23/22   Salvadore Dom, MD  metroNIDAZOLE (METROGEL) 0.75 % gel metronidazole 0.75 % topical gel  APPLY TO AFFECTED AREA EVERY DAY    [provider]  Multiple Vitamins-Minerals (WOMENS MULTIVITAMIN PLUS PO) Take 1 tablet by mouth daily.    [provider]  Omega 3 1200 MG CAPS Take 2 capsules by mouth daily.    [provider]  omeprazole (PRILOSEC) 20 MG capsule Take by mouth daily. 10/23/17   [provider]  tretinoin (RETIN-A) 0.025 % cream SMARTSIG:sparingly Topical Every Evening 06/30/21   [provider]    Physical Exam: Vitals:   10/01/22 2001 10/01/22 2245 10/01/22 2301 10/01/22 2302  BP: (!) 144/71 (!) 148/74  (!) 148/74  Pulse: 83 85  85  Resp: 17   16  Temp: 98.1 F (36.7 C)  98 F (36.7 C) 98 F (36.7 C)  TempSrc: Oral   Oral  SpO2: 98% 99%  99%  Weight:      Height:        Physical Exam Vitals reviewed.  Constitutional:      General: She is not in acute distress. HENT:     Head: Normocephalic and atraumatic.  Eyes:     Extraocular Movements: Extraocular movements intact.  Cardiovascular:     Rate and Rhythm: Normal rate and regular rhythm.     Pulses: Normal pulses.  Pulmonary:     Effort: Pulmonary effort is normal. No respiratory distress.     Breath sounds: Normal breath sounds. No wheezing or rales.  Abdominal:     General: Bowel sounds are normal. There  is no distension.     Palpations: Abdomen is soft.     Tenderness: There is no abdominal tenderness.  Musculoskeletal:     Cervical back: Normal range of motion.     Right lower leg: No edema.     Left lower leg: No edema.  Skin:    General: Skin is warm and dry.  Neurological:     General: No focal deficit present.     Mental Status: She is alert and oriented to person, place, and time.     Cranial Nerves: No cranial nerve deficit.     Sensory: No sensory deficit.     Motor: No weakness.     Labs on Admission: I have personally reviewed following labs and  imaging studies  CBC: Recent Labs  Lab 10/01/22 1700  WBC 8.0  NEUTROABS 5.2  HGB 14.5  HCT 42.2  MCV 87.4  PLT 123XX123   Basic Metabolic Panel: Recent Labs  Lab 10/01/22 1700  NA 141  K 3.6  CL 105  CO2 26  GLUCOSE 119*  BUN 17  CREATININE 0.92  CALCIUM 10.9*   GFR: Estimated Creatinine Clearance: 62.9 mL/min (by C-G formula based on SCr of 0.92 mg/dL). Liver Function Tests: Recent Labs  Lab 10/01/22 1700  AST 32  ALT 46*  ALKPHOS 89  BILITOT 0.4  PROT 7.9  ALBUMIN 4.8   No results for input(s): "LIPASE", "AMYLASE" in the last 168 hours. No results for input(s): "AMMONIA" in the last 168 hours. Coagulation Profile: Recent Labs  Lab 10/01/22 1700  INR 1.0   Cardiac Enzymes: No results for input(s): "CKTOTAL", "CKMB", "CKMBINDEX", "TROPONINI" in the last 168 hours. BNP (last 3 results) No results for input(s): "PROBNP" in the last 8760 hours. HbA1C: No results for input(s): "HGBA1C" in the last 72 hours. CBG: Recent Labs  Lab 10/01/22 1700  GLUCAP 107*   Lipid Profile: No results for input(s): "CHOL", "HDL", "LDLCALC", "TRIG", "CHOLHDL", "LDLDIRECT" in the last 72 hours. Thyroid Function Tests: No results for input(s): "TSH", "T4TOTAL", "FREET4", "T3FREE", "THYROIDAB" in the last 72 hours. Anemia Panel: No results for input(s): "VITAMINB12", "FOLATE", "FERRITIN", "TIBC", "IRON",  "RETICCTPCT" in the last 72 hours. Urine analysis:    Component Value Date/Time   COLORURINE YELLOW 03/14/2022 [place] 03/14/2022 1441   LABSPEC 1.010 03/14/2022 1441   PHURINE 6.5 03/14/2022 1441   GLUCOSEU NEGATIVE 03/14/2022 1441   HGBUR 2+ (A) 03/14/2022 1441   BILIRUBINUR NEGATIVE 09/23/2014 0943   BILIRUBINUR N 07/27/2014 0908   KETONESUR NEGATIVE 03/14/2022 1441   PROTEINUR NEGATIVE 03/14/2022 1441   UROBILINOGEN 1.0 09/23/2014 0943   NITRITE NEGATIVE 03/14/2022 1441   LEUKOCYTESUR NEGATIVE 03/14/2022 1441    Radiological Exams on Admission: CT Angio Head Neck W WO CM  Result Date: 10/01/2022 CLINICAL DATA:  Initial evaluation for right-sided hemiparesis., which EXAM: CT ANGIOGRAPHY HEAD AND NECK TECHNIQUE: Multidetector CT imaging of the head and neck was performed using the standard protocol during bolus administration of intravenous contrast. Multiplanar CT image reconstructions and MIPs were obtained to evaluate the vascular anatomy. Carotid stenosis measurements (when applicable) are obtained utilizing NASCET criteria, using the distal internal carotid diameter as the denominator. RADIATION DOSE REDUCTION: This exam was performed according to the departmental dose-optimization program which includes automated exposure control, adjustment of the mA and/or kV according to patient size and/or use of iterative reconstruction technique. CONTRAST:  53m OMNIPAQUE IOHEXOL 350 MG/ML SOLN COMPARISON:  Prior head CT from earlier the same day. FINDINGS: CTA NECK FINDINGS Aortic arch: Visualized aortic arch normal caliber with standard 3 vessel morphology. No stenosis about the origin of the great vessels. Right carotid system: Right common and internal carotid arteries widely patent without stenosis, dissection or occlusion. Left carotid system: Left common and internal carotid arteries widely patent without stenosis, dissection or occlusion. Vertebral arteries: Both vertebral  arteries arise from the subclavian arteries. No proximal subclavian artery stenosis. Both vertebral arteries widely patent without stenosis, dissection or occlusion. Skeleton: No discrete or worrisome osseous lesions. Moderate spondylosis noted at C5-6. Other neck: No other acute soft tissue abnormality within the neck. Upper chest: Visualized upper chest demonstrates no acute finding. Review of the MIP images confirms the above findings CTA HEAD FINDINGS Anterior  circulation: Both internal carotid arteries widely patent to the termini without stenosis. A1 segments widely patent. Normal anterior communicating artery complex. Both anterior cerebral arteries widely patent to their distal aspects without stenosis. No M1 stenosis or occlusion. Normal MCA bifurcations. Distal MCA branches well perfused and symmetric. Posterior circulation: Both V4 segments patent to the vertebrobasilar junction without stenosis. Both PICA origins patent and normal. Basilar widely patent to its distal aspect without stenosis. Superior cerebellar arteries patent bilaterally. Both PCAs primarily supplied via the basilar and are well perfused to there distal aspects. Venous sinuses: Patent allowing for timing the contrast bolus. Anatomic variants: None significant.  No aneurysm. Review of the MIP images confirms the above findings IMPRESSION: Normal CTA of the head and neck. No large vessel occlusion or other emergent finding. No hemodynamically significant or correctable stenosis. Electronically Signed   By: Jeannine Boga M.D.   On: 10/01/2022 20:11   CT HEAD CODE STROKE WO CONTRAST  Result Date: 10/01/2022 CLINICAL DATA:  Code stroke.  Right facial numbness EXAM: CT HEAD WITHOUT CONTRAST TECHNIQUE: Contiguous axial images were obtained from the base of the skull through the vertex without intravenous contrast. RADIATION DOSE REDUCTION: This exam was performed according to the departmental dose-optimization program which includes  automated exposure control, adjustment of the mA and/or kV according to patient size and/or use of iterative reconstruction technique. COMPARISON:  Brain MRI 05/10/2007 FINDINGS: Brain: There is no acute intracranial hemorrhage, extra-axial fluid collection, or acute infarct. Parenchymal volume is normal. The ventricles are normal in size. Gray-white differentiation is preserved. 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 A partially empty sella is noted, nonspecific. There is no solid mass lesion. There is no mass effect or midline shift. Vascular: No dense vessel is seen. Skull: Normal. Negative for fracture or focal lesion. Sinuses/Orbits: The imaged paranasal sinuses are clear. Bilateral lens implants are in place. The globes and orbits are otherwise unremarkable. ASPECTS [facility] Stroke Program Early CT Score) - Ganglionic level infarction (caudate, lentiform nuclei, internal capsule, insula, M1-M3 cortex): 7 - Supraganglionic infarction (M4-M6 cortex): 3 Total score (0-10 with 10 being normal): 10 IMPRESSION: No acute intracranial pathology. Electronically Signed   By: Valetta Mole M.D.   On: 10/01/2022 17:07    EKG: Independently reviewed. Sinus tachycardia, LBBB, QTc 508.  No significant change since prior tracing.  Assessment and Plan  TIA Patient with acute onset right-sided facial, arm, and leg numbness.  Seen by teleneurology and tPA was not given due to mild symptoms.  CT head negative for acute finding.  CTA head and neck negative for LVO.  Symptoms have now significantly improved.  MRI brain showing no acute intracranial abnormality. Small remote left cerebellar infarct. Underlying mild chronic microvascular ischemic disease for age. MRI C-spine showing normal appearance of the cervical spinal cord. No cord signal changes to suggest myelopathy. No other acute abnormality. Degenerative disc osteophyte at C5-6 with resultant moderate to severe spinal stenosis. Right  paracentral disc extrusion with superior migration at C6-7 with resultant moderate spinal stenosis. Small central disc protrusion at C4-5 with resultant mild spinal stenosis. -Neurology following, recommended admission for TIA workup -Telemetry monitoring -Echocardiogram -Hemoglobin A1c, fasting lipid panel -Discussed with on-call neurologist Dr. Lorrin Goodell, he recommends aspirin 81 mg daily and Plavix 75 mg daily x 21 days, followed by aspirin 81 mg daily alone. -Frequent neurochecks -PT, OT, speech therapy. -N.p.o. until cleared by bedside swallow evaluation or formal speech evaluation  Hypertension Allow permissive hypertension up  to 220/110 at this time.  Hold home antihypertensives.  GERD -Continue Pepcid  DVT prophylaxis: Lovenox Code Status: Full Code (discussed with the patient) Level of care: Telemetry bed Admission status: It is my clinical opinion that referral for OBSERVATION is reasonable and necessary in this patient based on the above information provided. The aforementioned taken together are felt to place the patient at high risk for further clinical deterioration. However, it is anticipated that the patient may be medically stable for discharge from the hospital within 24 to 48 hours.   Shela Leff MD [facility]  If 7PM-7AM, please contact night-coverage [email or web address]  10/02/2022, 1:23 AM

## 2022-10-03 ENCOUNTER — Other Ambulatory Visit (HOSPITAL_COMMUNITY): Payer: Self-pay

## 2022-10-03 ENCOUNTER — Telehealth (HOSPITAL_COMMUNITY): Payer: Self-pay | Admitting: Pharmacy Technician

## 2022-10-03 ENCOUNTER — Observation Stay (HOSPITAL_BASED_OUTPATIENT_CLINIC_OR_DEPARTMENT_OTHER): Payer: 59

## 2022-10-03 DIAGNOSIS — G459 Transient cerebral ischemic attack, unspecified: Secondary | ICD-10-CM

## 2022-10-03 DIAGNOSIS — I447 Left bundle-branch block, unspecified: Secondary | ICD-10-CM | POA: Diagnosis not present

## 2022-10-03 DIAGNOSIS — I429 Cardiomyopathy, unspecified: Secondary | ICD-10-CM

## 2022-10-03 LAB — HEMOGLOBIN A1C
Hgb A1c MFr Bld: 5.6 % (ref 4.8–5.6)
Mean Plasma Glucose: 114 mg/dL

## 2022-10-03 MED ORDER — NITROGLYCERIN 0.4 MG SL SUBL
SUBLINGUAL_TABLET | SUBLINGUAL | Status: AC
  Filled 2022-10-03: qty 2

## 2022-10-03 MED ORDER — IOHEXOL 350 MG/ML SOLN
95.0000 mL | Freq: Once | INTRAVENOUS | Status: AC | PRN
  Administered 2022-10-03: 95 mL via INTRAVENOUS

## 2022-10-03 MED ORDER — CLOPIDOGREL BISULFATE 75 MG PO TABS: 75.0000 mg | ORAL_TABLET | Freq: Every day | ORAL | 0 refills | Status: AC

## 2022-10-03 MED ORDER — METOPROLOL TARTRATE 5 MG/5ML IV SOLN
INTRAVENOUS | Status: AC
  Filled 2022-10-03: qty 5

## 2022-10-03 MED ORDER — METOPROLOL SUCCINATE ER 25 MG PO TB24: 25.0000 mg | ORAL_TABLET | Freq: Every day | ORAL | 2 refills | Status: DC

## 2022-10-03 MED ORDER — ROSUVASTATIN CALCIUM 20 MG PO TABS: 40.0000 mg | ORAL_TABLET | Freq: Every day | ORAL | Status: DC

## 2022-10-03 MED ORDER — ASPIRIN 81 MG PO TBEC: 81.0000 mg | DELAYED_RELEASE_TABLET | Freq: Every day | ORAL | 3 refills | Status: AC

## 2022-10-03 MED ORDER — LOSARTAN POTASSIUM 25 MG PO TABS: 12.5000 mg | ORAL_TABLET | Freq: Every day | ORAL | 0 refills | Status: DC

## 2022-10-03 MED ORDER — ROSUVASTATIN CALCIUM 40 MG PO TABS: 40.0000 mg | ORAL_TABLET | Freq: Every day | ORAL | 2 refills | Status: DC

## 2022-10-03 MED ORDER — LOSARTAN POTASSIUM 25 MG PO TABS: 12.5000 mg | ORAL_TABLET | Freq: Every day | ORAL | Status: DC

## 2022-10-03 NOTE — Progress Notes (Signed)
IV removed and telemetry discontinued.  Gwendolyn Grant, RN

## 2022-10-03 NOTE — Discharge Instructions (Signed)
Erika Bolton,  You were in the hospital because of concern for possible stroke but it seems you may have had a TIA. During your workup, you were also found to have a moderately weakened heart. The cardiologist was consulted. You have been started on many medications; please take your medications as prescribed. Please follow-up with your PCP, the neurologist and the cardiologist.

## 2022-10-03 NOTE — TOC Benefit Eligibility Note (Signed)
Patient Teacher, English as a foreign language completed.    The patient is currently admitted and upon discharge could be taking Jardiance 10 mg.  The current 30 day co-pay is $70.00.   The patient is currently admitted and upon discharge could be taking Farxiga 10 mg.  Not Covered  The patient is currently admitted and upon discharge could be taking Entresto 24-26 mg.  Requires Prior Authorization  The patient is insured through [place], [place] Patient Advocate Specialist [place] Patient Advocate Team Direct Number: [phone]  Fax: [phone]

## 2022-10-03 NOTE — Telephone Encounter (Signed)
Patient Advocate Encounter  Prior Authorization for Entresto 24-'26MG'$  tablets has been approved.    PA# [id number] Insurance [employer] Electronic Prior Authorization Form Effective dates: 10/03/2022 through 10/03/2023  Patients co-pay is $70.00.     Lyndel Safe, [place] Patient Advocate Specialist [place] Patient Advocate Team Direct Number: [phone]  Fax: [phone]

## 2022-10-03 NOTE — Progress Notes (Addendum)
Rounding Note    Patient Name: Erika Bolton Date of Encounter: 10/03/2022  [place] Cardiologist: None   Subjective   Feeling well. Family at the bedside.   Inpatient Medications    Scheduled Meds:   stroke: early stages of recovery book   Does not apply Once   aspirin EC  81 mg Oral Daily   clopidogrel  75 mg Oral Daily   enoxaparin (LOVENOX) injection  40 mg Subcutaneous Q24H   famotidine  40 mg Oral Daily   losartan  12.5 mg Oral Daily   metoprolol succinate  25 mg Oral Daily   metoprolol tartrate       metoprolol tartrate       nitroGLYCERIN       [START ON 10/04/2022] rosuvastatin  40 mg Oral Daily   Continuous Infusions:  PRN Meds: acetaminophen, metoprolol tartrate, metoprolol tartrate, nitroGLYCERIN   Vital Signs    Vitals:   10/03/22 0340 10/03/22 0715 10/03/22 1123 10/03/22 1518  BP: (!) 113/57 129/68 121/68 116/89  Pulse: 73 74 93 73  Resp: '16 16 16 15  '$ Temp: 97.7 F (36.5 C) 98 F (36.7 C) 98.8 F (37.1 C) 97.9 F (36.6 C)  TempSrc: Oral Oral Oral Oral  SpO2: 96% 97% 98% 97%  Weight:      Height:       No intake or output data in the 24 hours ending 10/03/22 1529    10/01/2022    4:42 PM 06/14/2022    2:37 PM 03/14/2022    2:20 PM  Last 3 Weights  Weight (lbs) 170 lb 10.2 oz 166 lb 169 lb  Weight (kg) 77.4 kg 75.297 kg 76.658 kg      Telemetry    Sinus Rhythm - Personally Reviewed  ECG    No new tracing   Physical Exam   GEN: No acute distress.   Neck: No JVD Cardiac: RRR, no murmurs, rubs, or gallops.  Respiratory: Clear to auscultation bilaterally. GI: Soft, nontender, non-distended  MS: No edema; No deformity. Neuro:  Nonfocal  Psych: Normal affect   Labs    High Sensitivity Troponin:  No results for input(s): "TROPONINIHS" in the last 720 hours.   Chemistry Recent Labs  Lab 10/01/22 1700  NA 141  K 3.6  CL 105  CO2 26  GLUCOSE 119*  BUN 17  CREATININE 0.92  CALCIUM 10.9*  PROT 7.9  ALBUMIN 4.8   AST 32  ALT 46*  ALKPHOS 89  BILITOT 0.4  GFRNONAA >60  ANIONGAP 10    Lipids  Recent Labs  Lab 10/02/22 0245  CHOL 206*  TRIG 72  HDL 68  LDLCALC 124*  CHOLHDL 3.0    Hematology Recent Labs  Lab 10/01/22 1700  WBC 8.0  RBC 4.83  HGB 14.5  HCT 42.2  MCV 87.4  MCH 30.0  MCHC 34.4  RDW 13.1  PLT 297   Thyroid No results for input(s): "TSH", "FREET4" in the last 168 hours.  BNPNo results for input(s): "BNP", "PROBNP" in the last 168 hours.  DDimer No results for input(s): "DDIMER" in the last 168 hours.   Radiology    CT CORONARY MORPH W/CTA COR W/SCORE W/CA W/CM &/OR WO/CM  Result Date: 10/03/2022 CLINICAL DATA:  67 y.o. female with a hx of normal coronaries in 2012 on cath, chronic LBBB, HTN, GERD, MGUS, cervical spine stenosis who is being seen 10/02/2022 for the evaluation of abnormal Echo at the request of Dr. Lonny Prude after  admit for TIA. Echo now EF 35%, hypokinesis of inf wall and apex. Mild LVH, G1DD, RV is normal EXAM: Cardiac/Coronary  CTA TECHNIQUE: The patient was scanned on a [employer]. FINDINGS: A 70 kV prospective scan was triggered in the descending thoracic aorta at 111 HU's. Axial non-contrast 3 mm slices were carried out through the heart. The data set was analyzed on a dedicated work station and scored using the [place]. Gantry rotation speed was 250 msecs and collimation was .6 mm. 0.8 mg of sl NTG was given. The 3D data set was reconstructed in 5% intervals of the 67-82 % of the R-R cycle. Diastolic phases were analyzed on a dedicated work station using MPR, MIP and VRT modes. The patient received 80 cc of contrast. Image quality: good Aorta: Normal size (33 mm ascending) . No calcifications. No dissection. Aortic Valve: No calcifications. Coronary Arteries:  Normal coronary origin.  Right dominance. RCA is a large dominant artery that gives rise to PDA and PLA. There is no plaque. Left main is a large artery that gives rise to LAD and LCX  arteries. LAD is a large vessel that has no plaque. D1 is a large, high take off diagonal, no plaque D2-3 small caliber, no plaque LCX is a non-dominant artery.  There is no plaque. OM1 and 2 are small and under filled. Other findings: Normal pulmonary vein drainage into the left atrium. Normal left atrial appendage without a thrombus. Normal size of the pulmonary artery. Please see radiology report for non cardiac findings. IMPRESSION: 1. Coronary calcium score of 0. 2. Normal coronary origin with right dominance. 3. No evidence of CAD.  Non ischemic cardiomyopathy. CAD-RADS 0.  No evidence of CAD (0%). Electronically Signed   By: Candee Furbish M.D.   On: 10/03/2022 13:51   ECHOCARDIOGRAM COMPLETE  Result Date: 10/02/2022    ECHOCARDIOGRAM REPORT   Patient Name:   Erika Bolton Date of Exam: 10/02/2022 Medical Rec #:  [id number]      Height:       64.5 in Accession #:    [id number]     Weight:       170.6 lb Date of Birth:  11-20-1955      BSA:          1.838 m Patient Age:    67 years       BP:           127/64 mmHg Patient Gender: F              HR:           74 bpm. Exam Location:  Inpatient Procedure: 2D Echo Indications:    TIA  History:        Patient has no prior history of Echocardiogram examinations.                 TIA; Risk Factors:Hypertension.  Sonographer:    Harvie Junior Referring Phys: Shela Leff  Sonographer Comments: Technically difficult study due to poor echo windows. IMPRESSIONS  1. Difficult acoustic windows. Hypokkinesis of the mid/distal septal wall, distal lateral. Hypokinesis of the inferior wall and apex. . Left ventricular ejection fraction, by estimation, is 35%. The left ventricle has severely decreased function. There is mild left ventricular hypertrophy. Left ventricular diastolic parameters are consistent with Grade I diastolic dysfunction (impaired relaxation).  2. Right ventricular systolic function is normal. The right ventricular size is normal.  3. Trivial mitral valve  regurgitation.  4.  The aortic valve is tricuspid. Aortic valve regurgitation is not visualized.  5. The inferior vena cava is normal in size with greater than 50% respiratory variability, suggesting right atrial pressure of 3 mmHg. FINDINGS  Left Ventricle: Difficult acoustic windows. Hypokkinesis of the mid/distal septal wall, distal lateral. Hypokinesis of the inferior wall and apex. Left ventricular ejection fraction, by estimation, is 35%. The left ventricle has severely decreased function. The left ventricular internal cavity size was normal in size. There is mild left ventricular hypertrophy. Left ventricular diastolic parameters are consistent with Grade I diastolic dysfunction (impaired relaxation). Right Ventricle: The right ventricular size is normal. Right vetricular wall thickness was not assessed. Right ventricular systolic function is normal. Left Atrium: Left atrial size was normal in size. Right Atrium: Right atrial size was normal in size. Pericardium: There is no evidence of pericardial effusion. Mitral Valve: There is mild thickening of the mitral valve leaflet(s). Mild mitral annular calcification. Trivial mitral valve regurgitation. Tricuspid Valve: The tricuspid valve is grossly normal. Tricuspid valve regurgitation is not demonstrated. Aortic Valve: The aortic valve is tricuspid. Aortic valve regurgitation is not visualized. Aortic valve mean gradient measures 3.0 mmHg. Aortic valve peak gradient measures 5.1 mmHg. Aortic valve area, by VTI measures 3.12 cm. Pulmonic Valve: The pulmonic valve was not well visualized. Pulmonic valve regurgitation is trivial. No evidence of pulmonic stenosis. Aorta: The aortic root is normal in size and structure. Venous: The inferior vena cava is normal in size with greater than 50% respiratory variability, suggesting right atrial pressure of 3 mmHg. IAS/Shunts: No atrial level shunt detected by color flow Doppler.  LEFT VENTRICLE PLAX 2D LVIDd:         5.20  cm      Diastology LVIDs:         3.70 cm      LV e' medial:    5.00 cm/s LV PW:         0.90 cm      LV E/e' medial:  12.2 LV IVS:        0.90 cm      LV e' lateral:   6.58 cm/s LVOT diam:     2.30 cm      LV E/e' lateral: 9.2 LV SV:         73 LV SV Index:   40 LVOT Area:     4.15 cm                              3D Volume EF: LV Volumes (MOD)            3D EF:        45 % LV vol d, MOD A2C: 142.0 ml LV EDV:       149 ml LV vol d, MOD A4C: 92.4 ml  LV ESV:       81 ml LV vol s, MOD A2C: 64.4 ml  LV SV:        68 ml LV vol s, MOD A4C: 51.3 ml LV SV MOD A2C:     77.6 ml LV SV MOD A4C:     92.4 ml LV SV MOD BP:      63.0 ml RIGHT VENTRICLE RV Basal diam:  2.60 cm RV Mid diam:    2.40 cm RV S prime:     11.90 cm/s TAPSE (M-mode): 2.0 cm LEFT ATRIUM  Index        RIGHT ATRIUM          Index LA diam:      3.40 cm 1.85 cm/m   RA Area:     8.87 cm LA Vol (A4C): 55.5 ml 30.19 ml/m  RA Volume:   15.70 ml 8.54 ml/m  AORTIC VALVE                    PULMONIC VALVE AV Area (Vmax):    3.18 cm     PV Vmax:       1.02 m/s AV Area (Vmean):   3.12 cm     PV Peak grad:  4.2 mmHg AV Area (VTI):     3.12 cm AV Vmax:           113.00 cm/s AV Vmean:          74.900 cm/s AV VTI:            0.233 m AV Peak Grad:      5.1 mmHg AV Mean Grad:      3.0 mmHg LVOT Vmax:         86.40 cm/s LVOT Vmean:        56.200 cm/s LVOT VTI:          0.175 m LVOT/AV VTI ratio: 0.75  AORTA Ao Root diam: 3.50 cm Ao Asc diam:  3.30 cm MITRAL VALVE MV Area (PHT): 3.91 cm    SHUNTS MV Decel Time: 194 msec    Systemic VTI:  0.18 m MV E velocity: 60.80 cm/s  Systemic Diam: 2.30 cm MV A velocity: 80.80 cm/s MV E/A ratio:  0.75 Dorris Carnes MD Electronically signed by Dorris Carnes MD Signature Date/Time: 10/02/2022/1:51:21 PM    Final    MR Cervical Spine Wo Contrast  Result Date: 10/02/2022 CLINICAL DATA:  Initial evaluation for acute myelopathy. EXAM: MRI CERVICAL SPINE WITHOUT CONTRAST TECHNIQUE: Multiplanar, multisequence MR imaging of the cervical  spine was performed. No intravenous contrast was administered. COMPARISON:  Prior MRI from 09/07/2021. FINDINGS: Alignment: Straightening with slight reversal of the normal cervical lordosis. Trace degenerative retrolisthesis of C5 on C6. Vertebrae: Vertebral body height maintained without acute or chronic fracture. Bone marrow signal intensity within normal limits. Few subcentimeter benign hemangiomata noted. No worrisome osseous lesions. No abnormal marrow edema. Cord: Normal signal and morphology. Posterior Fossa, vertebral arteries, paraspinal tissues: Unremarkable. Disc levels: C2-C3: Small central disc protrusion with annular fissure indents the ventral thecal sac. No spinal stenosis or cord impingement. Foramina remain patent. C3-C4: Small central disc protrusion with annular fissure indents the ventral thecal sac. No significant spinal stenosis or cord impingement. Foramina remain patent. C4-C5: Disc desiccation with mild disc bulge and uncovertebral spurring. Superimposed small central disc protrusion with annular fissure indents the ventral thecal sac (series 7, image 22). Minimal cord flattening without cord signal changes. Mild spinal stenosis. Foramina remain patent. C5-C6: Trace retrolisthesis. Degenerative intervertebral disc space narrowing with diffuse disc osteophyte complex. Broad posterior component flattens and effaces the ventral thecal sac. Mild cord flattening without cord signal changes. Moderate to severe spinal stenosis with the thecal sac measuring 7 mm in AP diameter. Foramina remain patent. C6-C7: Right paracentral disc extrusion with superior migration (series 7, image 30). For disc material contacts and minimally flattens the ventral cord. No cord signal changes. Moderate spinal stenosis. Foramina remain patent. C7-T1:  Unremarkable. IMPRESSION: 1. Normal MRI appearance of the cervical spinal cord. No cord signal changes to suggest  myelopathy. No other acute abnormality. 2.  Degenerative disc osteophyte at C5-6 with resultant moderate to severe spinal stenosis. 3. Right paracentral disc extrusion with superior migration at C6-7 with resultant moderate spinal stenosis. 4. Small central disc protrusion at C4-5 with resultant mild spinal stenosis. Electronically Signed   By: Jeannine Boga M.D.   On: 10/02/2022 01:44   MR BRAIN WO CONTRAST  Result Date: 10/02/2022 CLINICAL DATA:  Initial evaluation for neuro deficit, stroke suspected. EXAM: MRI HEAD WITHOUT CONTRAST TECHNIQUE: Multiplanar, multiecho pulse sequences of the brain and surrounding structures were obtained without intravenous contrast. COMPARISON:  Prior CTs from 10/01/2022. FINDINGS: Brain: Cerebral volume within normal limits. Minimal scattered chronic microvascular ischemic disease noted involving the supratentorial cerebral white matter. Small remote left cerebellar infarct. No evidence for acute or subacute ischemia. Gray-white matter differentiation maintained. Orders of chronic cortical infarction. No acute intracranial hemorrhage. Single punctate chronic microhemorrhage noted within the right thalamus. No other chronic intracranial blood products. No mass lesion, midline shift or mass effect. No hydrocephalus or extra-axial fluid collection. Pituitary gland and suprasellar region within normal limits. Vascular: Major intracranial vascular flow voids are maintained. Skull and upper cervical spine: Craniocervical junction normal. Bone marrow signal intensity within normal limits. No scalp soft tissue abnormality. Sinuses/Orbits: Prior bilateral ocular lens replacement. Paranasal sinuses are clear. No mastoid effusion. Other: None. IMPRESSION: 1. No acute intracranial abnormality. 2. Small remote left cerebellar infarct. 3. Underlying mild chronic microvascular ischemic disease for age. Electronically Signed   By: Jeannine Boga M.D.   On: 10/02/2022 01:38   CT Angio Head Neck W WO CM  Result Date:  10/01/2022 CLINICAL DATA:  Initial evaluation for right-sided hemiparesis., which EXAM: CT ANGIOGRAPHY HEAD AND NECK TECHNIQUE: Multidetector CT imaging of the head and neck was performed using the standard protocol during bolus administration of intravenous contrast. Multiplanar CT image reconstructions and MIPs were obtained to evaluate the vascular anatomy. Carotid stenosis measurements (when applicable) are obtained utilizing NASCET criteria, using the distal internal carotid diameter as the denominator. RADIATION DOSE REDUCTION: This exam was performed according to the departmental dose-optimization program which includes automated exposure control, adjustment of the mA and/or kV according to patient size and/or use of iterative reconstruction technique. CONTRAST:  15m OMNIPAQUE IOHEXOL 350 MG/ML SOLN COMPARISON:  Prior head CT from earlier the same day. FINDINGS: CTA NECK FINDINGS Aortic arch: Visualized aortic arch normal caliber with standard 3 vessel morphology. No stenosis about the origin of the great vessels. Right carotid system: Right common and internal carotid arteries widely patent without stenosis, dissection or occlusion. Left carotid system: Left common and internal carotid arteries widely patent without stenosis, dissection or occlusion. Vertebral arteries: Both vertebral arteries arise from the subclavian arteries. No proximal subclavian artery stenosis. Both vertebral arteries widely patent without stenosis, dissection or occlusion. Skeleton: No discrete or worrisome osseous lesions. Moderate spondylosis noted at C5-6. Other neck: No other acute soft tissue abnormality within the neck. Upper chest: Visualized upper chest demonstrates no acute finding. Review of the MIP images confirms the above findings CTA HEAD FINDINGS Anterior circulation: Both internal carotid arteries widely patent to the termini without stenosis. A1 segments widely patent. Normal anterior communicating artery complex.  Both anterior cerebral arteries widely patent to their distal aspects without stenosis. No M1 stenosis or occlusion. Normal MCA bifurcations. Distal MCA branches well perfused and symmetric. Posterior circulation: Both V4 segments patent to the vertebrobasilar junction without stenosis. Both PICA origins patent and normal. Basilar widely patent to its  distal aspect without stenosis. Superior cerebellar arteries patent bilaterally. Both PCAs primarily supplied via the basilar and are well perfused to there distal aspects. Venous sinuses: Patent allowing for timing the contrast bolus. Anatomic variants: None significant.  No aneurysm. Review of the MIP images confirms the above findings IMPRESSION: Normal CTA of the head and neck. No large vessel occlusion or other emergent finding. No hemodynamically significant or correctable stenosis. Electronically Signed   By: Jeannine Boga M.D.   On: 10/01/2022 20:11   CT HEAD CODE STROKE WO CONTRAST  Result Date: 10/01/2022 CLINICAL DATA:  Code stroke.  Right facial numbness EXAM: CT HEAD WITHOUT CONTRAST TECHNIQUE: Contiguous axial images were obtained from the base of the skull through the vertex without intravenous contrast. RADIATION DOSE REDUCTION: This exam was performed according to the departmental dose-optimization program which includes automated exposure control, adjustment of the mA and/or kV according to patient size and/or use of iterative reconstruction technique. COMPARISON:  Brain MRI 05/10/2007 FINDINGS: Brain: There is no acute intracranial hemorrhage, extra-axial fluid collection, or acute infarct. Parenchymal volume is normal. The ventricles are normal in size. Gray-white differentiation is preserved. 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 A partially empty sella is noted, nonspecific. There is no solid mass lesion. There is no mass effect or midline shift. Vascular: No dense vessel is seen. Skull: Normal. Negative for  fracture or focal lesion. Sinuses/Orbits: The imaged paranasal sinuses are clear. Bilateral lens implants are in place. The globes and orbits are otherwise unremarkable. ASPECTS [facility] Stroke Program Early CT Score) - Ganglionic level infarction (caudate, lentiform nuclei, internal capsule, insula, M1-M3 cortex): 7 - Supraganglionic infarction (M4-M6 cortex): 3 Total score (0-10 with 10 being normal): 10 IMPRESSION: No acute intracranial pathology. Electronically Signed   By: Valetta Mole M.D.   On: 10/01/2022 17:07    Cardiac Studies   Echo: 10/02/2022  IMPRESSIONS     1. Difficult acoustic windows. Hypokkinesis of the mid/distal septal  wall, distal lateral. Hypokinesis of the inferior wall and apex. . Left  ventricular ejection fraction, by estimation, is 35%. The left ventricle  has severely decreased function. There  is mild left ventricular hypertrophy. Left ventricular diastolic  parameters are consistent with Grade I diastolic dysfunction (impaired  relaxation).   2. Right ventricular systolic function is normal. The right ventricular  size is normal.   3. Trivial mitral valve regurgitation.   4. The aortic valve is tricuspid. Aortic valve regurgitation is not  visualized.   5. The inferior vena cava is normal in size with greater than 50%  respiratory variability, suggesting right atrial pressure of 3 mmHg.   FINDINGS   Left Ventricle: Difficult acoustic windows. Hypokkinesis of the  mid/distal septal wall, distal lateral. Hypokinesis of the inferior wall  and apex. Left ventricular ejection fraction, by estimation, is 35%. The  left ventricle has severely decreased  function. The left ventricular internal cavity size was normal in size.  There is mild left ventricular hypertrophy. Left ventricular diastolic  parameters are consistent with Grade I diastolic dysfunction (impaired  relaxation).   Right Ventricle: The right ventricular size is normal. Right vetricular  wall  thickness was not assessed. Right ventricular systolic function is  normal.   Left Atrium: Left atrial size was normal in size.   Right Atrium: Right atrial size was normal in size.   Pericardium: There is no evidence of pericardial effusion.   Mitral Valve: There is mild thickening of the mitral valve  leaflet(s).  Mild mitral annular calcification. Trivial mitral valve regurgitation.   Tricuspid Valve: The tricuspid valve is grossly normal. Tricuspid valve  regurgitation is not demonstrated.   Aortic Valve: The aortic valve is tricuspid. Aortic valve regurgitation is  not visualized. Aortic valve mean gradient measures 3.0 mmHg. Aortic valve  peak gradient measures 5.1 mmHg. Aortic valve area, by VTI measures 3.12  cm.   Pulmonic Valve: The pulmonic valve was not well visualized. Pulmonic valve  regurgitation is trivial. No evidence of pulmonic stenosis.   Aorta: The aortic root is normal in size and structure.   Venous: The inferior vena cava is normal in size with greater than 50%  respiratory variability, suggesting right atrial pressure of 3 mmHg.   IAS/Shunts: No atrial level shunt detected by color flow Doppler.    Patient Profile     67 y.o. female with a hx of normal coronaries in 2012 on cath, chronic LBBB, HTN, GERD, MGUS, cervical spine stenosis who is being seen 10/02/2022 for the evaluation of abnormal Echo at the request of Dr. Lonny Prude after admit for TIA.   Assessment & Plan    HFrEF NICM -- Echocardiogram showed LVEF of 35%, hypokinesis of the mid/distal septal wall, distal lateral wall and apex, diastolic dysfunction, normal RV size and function, trivial MR.  -- Underwent coronary CTA with which was negative for CAD, calcium score of 0 -- GDMT: Limited in the setting of lower blood pressures and recent TIA.  Tolerating the addition of metoprolol 12.5 mg twice daily, add losartan 12.5 mg daily.  Transition to [facility] with the addition of spironolactone as an  outpatient if blood pressures tolerate.  HTN -- Stable, on the lower side of normal -- As above continue metoprolol 12.5 mg twice daily, add losartan 12.5 mg daily  TIA -- Seen by neurology -- On aspirin, Plavix and statin  HLD -- Goal LDL less than 70, increase Crestor to '40mg'$  daily  Will arrange for outpatient follow up  For questions or updates, please contact [place] Please consult [email or web address] for contact info under        Signed, Shelva Majestic, MD  10/03/2022, 3:29 PM      Patient seen and examined. Agree with assessment and plan.  Patient feels well.  Coronary CTA reveals calcium score 0 without CAD.  Patient now on metoprolol succinate 25 mg daily and will initiate losartan 12.5 mg daily.  As outpatient can transition from losartan to [facility] with potential future spironolactone if LV function remains depressed.  Okay from cardiac standpoint for discharge.   Troy Sine, MD, [facility] 10/03/2022 3:29 PM

## 2022-10-03 NOTE — Progress Notes (Signed)
   Heart Failure Stewardship Pharmacist Progress Note   PCP: Lavone Orn, MD PCP-Cardiologist: None    HPI:  67 yo F with PMH of cervical spine stenosis, LBBB, HTN, GERD, and MGUS.  Presented to the ED On 3/3 with R sided numbness. Code stroke activated, symptoms too mild for TNK. UDS negative. CT head without acute finding. Normal CTA. MRI showed small remote left cerebellar infarct and underlying mild chronic microvascular ischemic disease for age. Diagnosed with TIA, allowed permissive HTN. ECHO on 3/4 showed LVEF reduced to 35%, mild LVH, G1DD, RV normal. Taken for CCTA today and found normal coronary arteries with a calcium score of 0.  Current HF Medications: Beta Blocker: metoprolol XL 25 mg daily  Prior to admission HF Medications: None  Pertinent Lab Values: Serum creatinine 0.92, BUN 17, Potassium 3.6, Sodium 141, A1c 5.6  Vital Signs: Weight: 170 lbs Blood pressure: 120/60s  Heart rate: 70s   Medication Assistance / Insurance Benefits Check: Does the patient have prescription insurance?  Yes Type of insurance plan: [facility] commercial insurance  Outpatient Pharmacy:  Prior to admission [facility] Is the patient willing to use [facility] at discharge? Yes Is the patient willing to transition their outpatient pharmacy to utilize a [facility] outpatient pharmacy?   Pending    Assessment: 1. 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 NYHA class II symptoms. - Not volume overloaded on exam, no indication for diuretics - Continue metoprolol XL 25 mg daily - Consider starting Entresto 24/26 mg BID for GDMT optimization - Consider starting MRA prior to discharge vs at follow up - Consider starting Jardiance 10 mg daily before discharge   Plan: 1) Medication changes recommended at this time: - Add Entresto 24/26 mg BID  2) Patient assistance: - Prior authorization for [employer] approved - copay card lowers to $10 per fill - Jardiance copay $170 -  copay card lowers to $10 per month - Farxiga not on patient's insurance formulary  3)  Education  - To be completed prior to discharge  Kerby Nora, PharmD, BCPS Heart Failure Cytogeneticist Phone [phone]

## 2022-10-04 ENCOUNTER — Other Ambulatory Visit: Payer: Self-pay

## 2022-10-04 ENCOUNTER — Emergency Department (HOSPITAL_COMMUNITY)
Admission: EM | Admit: 2022-10-04 | Discharge: 2022-10-04 | Disposition: A | Discharge status: Home / Self Care | Payer: 59 | Attending: Emergency Medicine | Admitting: Emergency Medicine

## 2022-10-04 ENCOUNTER — Encounter (HOSPITAL_COMMUNITY): Payer: Self-pay

## 2022-10-04 DIAGNOSIS — R202 Paresthesia of skin: Secondary | ICD-10-CM | POA: Diagnosis present

## 2022-10-04 DIAGNOSIS — Z79899 Other long term (current) drug therapy: Secondary | ICD-10-CM | POA: Insufficient documentation

## 2022-10-04 DIAGNOSIS — I69951 Hemiplegia and hemiparesis following unspecified cerebrovascular disease affecting right dominant side: Secondary | ICD-10-CM | POA: Diagnosis not present

## 2022-10-04 DIAGNOSIS — Z7902 Long term (current) use of antithrombotics/antiplatelets: Secondary | ICD-10-CM | POA: Diagnosis not present

## 2022-10-04 DIAGNOSIS — I10 Essential (primary) hypertension: Secondary | ICD-10-CM | POA: Insufficient documentation

## 2022-10-04 DIAGNOSIS — Z7982 Long term (current) use of aspirin: Secondary | ICD-10-CM | POA: Diagnosis not present

## 2022-10-04 DIAGNOSIS — G8191 Hemiplegia, unspecified affecting right dominant side: Secondary | ICD-10-CM

## 2022-10-04 LAB — CBG MONITORING, ED: Glucose-Capillary: 177 mg/dL — ABNORMAL HIGH (ref 70–99)

## 2022-10-04 NOTE — ED Provider Notes (Signed)
[facility] Provider Note   CSN: [id number] Arrival date & time: 10/04/22  1050     History  Chief Complaint  Patient presents with   Stroke-Like s/s    Erika Bolton is a 67 y.o. female.  The history is provided by the patient and medical records. No language interpreter was used.     67 year old female with significant history of cervical spinal stenosis, left bundle branch block, hypertension, GERD, recently hospitalized on 3/3 and was discharged 3/5 for TIA symptoms presenting today with similar presentation.  Patient was previously admitted to the hospital with strokelike symptoms including tingling sensation to the right side of her face arm and leg.  Her workup was remarkable for newly diagnosed cardiomyopathy with an EF of 35%.  Cardiac CT was negative for CAD and cardiology recommend outpatient follow-up.  Neurology also evaluated patient and recommend dual platelet therapy including Plavix for 21 days and aspirin indefinitely and patient is scheduled to follow-up for an EMG next week as well as outpatient follow-up with neurologist Dr. Leonie Man.  Approximately an hour ago, patient report she developed some tingling sensation to the right side of the face, right arm and right leg felt very similar to symptoms that she was admitted recently.  She mention his symptoms seems to be improving but not fully resolved.  She did take Plavix and aspirin as previously prescribed.  She denies any severe headache, vision changes, slurring of speech, confusion, focal weakness.  Home Medications Prior to Admission medications   Medication Sig Start Date End Date Taking? Authorizing Provider  aspirin EC 81 MG tablet Take 1 tablet (81 mg total) by mouth daily. Swallow whole. 10/04/22 10/04/23  Erika Aloe, MD  carboxymethylcellulose (REFRESH PLUS) 0.5 % SOLN Place 1 drop into both eyes daily as needed (for dry eye).    [provider]   cholecalciferol (VITAMIN D) 1000 UNITS tablet Take 1,000 Units by mouth daily.    [provider]  clopidogrel (PLAVIX) 75 MG tablet Take 1 tablet (75 mg total) by mouth daily for 19 days. 10/04/22 10/23/22  Erika Aloe, MD  COVID-19 mRNA vaccine [phone] (COMIRNATY) syringe Inject into the muscle. 05/23/22   Carlyle Basques, MD  cyanocobalamin (VITAMIN B12) 1000 MCG tablet Take 1,000 mcg by mouth every other day.    [provider]  Estradiol 10 MCG TABS vaginal tablet Place 1 tablet (10 mcg total) vaginally 2 (two) times a week. 03/23/22   Salvadore Dom, MD  famotidine (PEPCID) 40 MG tablet Take 40 mg by mouth daily. 09/27/22   [provider]  losartan (COZAAR) 25 MG tablet Take 0.5 tablets (12.5 mg total) by mouth daily. 10/03/22 11/02/22  Erika Aloe, MD  metoprolol succinate (TOPROL-XL) 25 MG 24 hr tablet Take 1 tablet (25 mg total) by mouth daily. 10/04/22 01/02/23  Erika Aloe, MD  metroNIDAZOLE (METROGEL) 0.75 % gel Apply 1 Application topically daily.    [provider]  Multiple Vitamins-Minerals (WOMENS MULTIVITAMIN PLUS PO) Take 1 tablet by mouth daily.    [provider]  Omega 3 1200 MG CAPS Take 2 capsules by mouth daily.    [provider]  rosuvastatin (CRESTOR) 40 MG tablet Take 1 tablet (40 mg total) by mouth daily. 10/04/22 01/02/23  Erika Aloe, MD  tretinoin (RETIN-A) 0.025 % cream Apply 1 application  topically at bedtime. 06/30/21   [provider]  UNABLE TO FIND Take 2  capsules by mouth at bedtime. Bone Up - calcium supplement    [provider]      Allergies    Other, Penicillins, Gold sodium thiosulfate, and Nickel    Review of Systems   Review of Systems  All other systems reviewed and are negative.   Physical Exam Updated Vital Signs BP (!) 147/73 (BP Location: Right Arm)   Pulse 98   Temp 98.2 F (36.8 C)   Resp 17   LMP 08/01/2007 (Approximate)   SpO2 96%  Physical  Exam Vitals and nursing note reviewed.  Constitutional:      General: She is not in acute distress.    Appearance: She is well-developed.  HENT:     Head: Atraumatic.  Eyes:     Extraocular Movements: Extraocular movements intact.     Conjunctiva/sclera: Conjunctivae normal.     Pupils: Pupils are equal, round, and reactive to light.  Cardiovascular:     Rate and Rhythm: Normal rate and regular rhythm.  Pulmonary:     Effort: Pulmonary effort is normal.     Breath sounds: No wheezing, rhonchi or rales.  Abdominal:     Palpations: Abdomen is soft.     Tenderness: There is no abdominal tenderness.  Musculoskeletal:        General: Normal range of motion.     Cervical back: Normal range of motion and neck supple.     Comments: 5/5 strength to all 4 extremities  Skin:    Findings: No rash.  Neurological:     Mental Status: She is alert and oriented to person, place, and time.     Comments: Neurologic exam:  Speech clear, pupils equal round reactive to light, extraocular movements intact  Normal peripheral visual fields Cranial nerves III through XII normal including no facial droop. Mild subjective decreased sensation to R side of face Follows commands, moves all extremities x4, normal strength to bilateral upper and lower extremities at all major muscle groups including grip Sensation normal to light touch and pinprick Coordination intact, no limb ataxia, finger-nose-finger normal Rapid alternating movements normal No pronator drift Gait normal   Psychiatric:        Mood and Affect: Mood normal.     ED Results / Procedures / Treatments   Labs (all labs ordered are listed, but only abnormal results are displayed) Labs Reviewed  CBG MONITORING, ED - Abnormal; Notable for the following components:      Result Value   Glucose-Capillary 177 (*)    All other components within normal limits    EKG None  Radiology CT CORONARY MORPH W/CTA COR W/SCORE W/CA W/CM &/OR  WO/CM  Result Date: 10/03/2022 CLINICAL DATA:  67 y.o. female with a hx of normal coronaries in 2012 on cath, chronic LBBB, HTN, GERD, MGUS, cervical spine stenosis who is being seen 10/02/2022 for the evaluation of abnormal Echo at the request of Dr. Lonny Prude after admit for TIA. Echo now EF 35%, hypokinesis of inf wall and apex. Mild LVH, G1DD, RV is normal EXAM: Cardiac/Coronary  CTA TECHNIQUE: The patient was scanned on a [employer]. FINDINGS: A 70 kV prospective scan was triggered in the descending thoracic aorta at 111 HU's. Axial non-contrast 3 mm slices were carried out through the heart. The data set was analyzed on a dedicated work station and scored using the [place]. Gantry rotation speed was 250 msecs and collimation was .6 mm. 0.8 mg of sl NTG was given. The 3D data set  was reconstructed in 5% intervals of the 67-82 % of the R-R cycle. Diastolic phases were analyzed on a dedicated work station using MPR, MIP and VRT modes. The patient received 80 cc of contrast. Image quality: good Aorta: Normal size (33 mm ascending) . No calcifications. No dissection. Aortic Valve: No calcifications. Coronary Arteries:  Normal coronary origin.  Right dominance. RCA is a large dominant artery that gives rise to PDA and PLA. There is no plaque. Left main is a large artery that gives rise to LAD and LCX arteries. LAD is a large vessel that has no plaque. D1 is a large, high take off diagonal, no plaque D2-3 small caliber, no plaque LCX is a non-dominant artery.  There is no plaque. OM1 and 2 are small and under filled. Other findings: Normal pulmonary vein drainage into the left atrium. Normal left atrial appendage without a thrombus. Normal size of the pulmonary artery. Please see radiology report for non cardiac findings. IMPRESSION: 1. Coronary calcium score of 0. 2. Normal coronary origin with right dominance. 3. No evidence of CAD.  Non ischemic cardiomyopathy. CAD-RADS 0.  No evidence of CAD (0%).  Electronically Signed   By: Erika Bolton M.D.   On: 10/03/2022 13:51    Procedures Procedures    Medications Ordered in ED Medications - No data to display  ED Course/ Medical Decision Making/ A&P                             Medical Decision Making  BP (!) 147/73 (BP Location: Right Arm)   Pulse 98   Temp 98.2 F (36.8 C)   Resp 17   LMP 08/01/2007 (Approximate)   SpO2 96%   69:17 PM 67 year old female with significant history of cervical spinal stenosis, left bundle branch block, hypertension, GERD, recently hospitalized on 3/3 and was discharged 3/5 for TIA symptoms presenting today with similar presentation.  Patient was previously admitted to the hospital with strokelike symptoms including tingling sensation to the right side of her face arm and leg.  Her workup was remarkable for newly diagnosed cardiomyopathy with an EF of 35%.  Cardiac CT was negative for CAD and cardiology recommend outpatient follow-up.  Neurology also evaluated patient and recommend dual platelet therapy including Plavix for 21 days and aspirin indefinitely and patient is scheduled to follow-up for an EMG next week as well as outpatient follow-up with neurologist Dr. Leonie Man.  Approximately an hour ago, patient report she developed some tingling sensation to the right side of the face, right arm and right leg felt very similar to symptoms that she was admitted recently.  She mention his symptoms seems to be improving but not fully resolved.  She did take Plavix and aspirin as previously prescribed.  She denies any severe headache, vision changes, slurring of speech, confusion, focal weakness.  On exam, patient is well-appearing appears slightly anxious.  Heart with normal rate and rhythm, lungs are clear to auscultation bilaterally abdomen soft nontender 5 out of 5 strength all 4 extremities.  She is mentating appropriately.  She does not have any focal neurodeficit except may be some slight decreased subjective  sensation to the right side of face without any facial droop no tongue deviation and her uvula rises equally.  Neck with full range of motion.  She has equal strength to both upper and lower extremities and she has a normal gait.  She is Romberg negative.  I promptly consulted  on-call neurologist and spoke with Dr. Cheral Marker who did review patient's previous hospital visit including neurologist notes and felt at this time no additional workup necessary as her symptom is similar to previous symptoms that she was admitted for.  She does have an outpatient EMG scheduled for next week and an appointment with her neurologist Dr. Leonie Man which she certainly can follow-up with.  I discussed this with patient and she is in agreement and felt comfortable going home.  She understand she should return promptly to the ER if she develop new or worsening symptoms.  Care discussed with Dr. Philip Aspen.   I have reviewed patient's prior EMR including imaging and labs and considered my plan of care.  DDx include TIA, stroke, cervical spinal stenosis, demyelinating syndrome, anxiety.  Hospitalization considered but after discussion with neurologist and with patient I felt patient stable to be discharged home to continue with current management which includes dual platelet therapy.  Labs reviewed including a CBG of 177.        Final Clinical Impression(s) / ED Diagnoses Final diagnoses:  Right hemiparesis [facility])    Rx / DC Orders ED Discharge Orders     None         Domenic Moras, PA-C 10/04/22 1236    Fransico Meadow, MD 10/06/22 1806

## 2022-10-04 NOTE — ED Provider Triage Note (Signed)
Emergency Medicine Provider Triage Evaluation Note  Erika Bolton , a 67 y.o. female  was evaluated in triage.  Pt complains of stroke like sxs. Report an hr she felt slight headache with tingling and numbness sensation to R side of face and body, felt similar to prior TIA she experienced recently when she was admitted and d/c yesterday for.  Took her ASA and Plavix that was prescribed.  LKW 1 hr ago.   Review of Systems  Positive: As above Negative: As above  Physical Exam  BP (!) 147/73 (BP Location: Right Arm)   Pulse 98   Temp 98.2 F (36.8 C)   Resp 17   LMP 08/01/2007 (Approximate)   SpO2 96%  Gen:   Awake, no distress   Resp:  Normal effort  MSK:   Moves extremities without difficulty  Other:  No focal neuro deficit  Medical Decision Making  Medically screening exam initiated at 11:06 AM.  Appropriate orders placed.  Erika Bolton was informed that the remainder of the evaluation will be completed by another provider, this initial triage assessment does not replace that evaluation, and the importance of remaining in the ED until their evaluation is complete.     Domenic Moras, PA-C 10/05/22 [phone]

## 2022-10-04 NOTE — Discharge Instructions (Addendum)
You have been evaluated for your symptoms.  At this time we felt you should follow-up closely with your neurologist for outpatient evaluation of your condition.  You will benefit from an EMG test next week.  Please continue to take aspirin and Plavix as previously prescribed.  If you develop new or worsening symptoms do not hesitate to return to the ER for reassessment.

## 2022-10-04 NOTE — ED Triage Notes (Signed)
Pt came in via POV d/t feeling stroke-like s/s starting at 1015. She was d/c from hospital yesterday from having a TIA, states the s/s she just felt this morning were the same/similar. States at 1015 she began to feel Rt sided facial numbness, Rt arm-leg weakness & HA. A/Ox4 & denies pain in triage.

## 2022-10-16 ENCOUNTER — Encounter: Payer: Self-pay | Admitting: Nurse Practitioner

## 2022-10-16 ENCOUNTER — Ambulatory Visit: Payer: 59 | Attending: Nurse Practitioner | Admitting: Nurse Practitioner

## 2022-10-16 VITALS — BP 122/80 | HR 75 | Ht 64.5 in | Wt 166.4 lb

## 2022-10-16 DIAGNOSIS — I428 Other cardiomyopathies: Secondary | ICD-10-CM | POA: Diagnosis not present

## 2022-10-16 DIAGNOSIS — I10 Essential (primary) hypertension: Secondary | ICD-10-CM

## 2022-10-16 DIAGNOSIS — Z8673 Personal history of transient ischemic attack (TIA), and cerebral infarction without residual deficits: Secondary | ICD-10-CM

## 2022-10-16 DIAGNOSIS — I447 Left bundle-branch block, unspecified: Secondary | ICD-10-CM

## 2022-10-16 DIAGNOSIS — E785 Hyperlipidemia, unspecified: Secondary | ICD-10-CM | POA: Diagnosis not present

## 2022-10-16 NOTE — Patient Instructions (Signed)
Medication Instructions:  Your physician recommends that you continue on your current medications as directed. Please refer to the Current Medication list given to you today.  *If you need a refill on your cardiac medications before your next appointment, please call your pharmacy*   Lab Work: Your physician recommends that you return for lab work in 6-8 weeks. CBC, CMET, Fasting Lipid panel   If you have labs (blood work) drawn today and your tests are completely normal, you will receive your results only by: MyChart Message (if you have MyChart) OR A paper copy in the mail If you have any lab test that is abnormal or we need to change your treatment, we will call you to review the results.   Testing/Procedures: Your physician has requested that you have an echocardiogram in 3 months. Echocardiography is a painless test that uses sound waves to create images of your heart. It provides your doctor with information about the size and shape of your heart and how well your heart's chambers and valves are working. This procedure takes approximately one hour. There are no restrictions for this procedure. Please do NOT wear cologne, perfume, aftershave, or lotions (deodorant is allowed). Please arrive 15 minutes prior to your appointment time.    Follow-Up: At [facility], you and your health needs are our priority.  As part of our continuing mission to provide you with exceptional heart care, we have created designated Provider Care Teams.  These Care Teams include your primary Cardiologist (physician) and Advanced Practice Providers (APPs -  Physician Assistants and Nurse Practitioners) who all work together to provide you with the care you need, when you need it.  We recommend signing up for the patient portal called "MyChart".  Sign up information is provided on this After Visit Summary.  MyChart is used to connect with patients for Virtual Visits (Telemedicine).  Patients are able  to view lab/test results, encounter notes, upcoming appointments, etc.  Non-urgent messages can be sent to your provider as well.   To learn more about what you can do with MyChart, go to [email or web address].    Your next appointment:   3-4 month(s)  Provider:   Shelva Majestic, MD  or Diona Browner, NP        Other Instructions

## 2022-10-16 NOTE — Progress Notes (Signed)
Office Visit    Patient Name: Erika Bolton Date of Encounter: 10/16/2022  Primary Care Provider:  Charlane Ferretti, MD Primary Cardiologist:  Shelva Majestic, MD  Chief Complaint    67 year old female with a history of chronic LBBB (normal coronaries by cath in 2012), cardiomyopathy, hypertension, hyperlipidemia, TIA, and MGUS who presents for hospital follow-up related to cardiomyopathy.  Past Medical History    Past Medical History:  Diagnosis Date   Colon polyps 2003   Detached retina    Hypertension    Hypertensive retinopathy    Kidney stone 2016   Left bundle branch block 2012   Menorrhagia    MGUS (monoclonal gammopathy of unknown significance)    Plantar fasciitis    Precancerous lesion 1987   mole excised from vulva   PVD (posterior vitreous detachment), right 09/07/2015   Past Surgical History:  Procedure Laterality Date   ABLATION  11/10/2002   Hysteroscopic Thermal   CATARACT EXTRACTION     cyst on scalp     sebaccous(multiple times as a teen)   EYE SURGERY     gastro surgery  07/31/2010   LAPAROSCOPIC CHOLECYSTECTOMY  11/28/2001   left leg muscle surgery  07/31/2010   OTHER SURGICAL HISTORY     Sebaceous cyst on head removed 7x's   RETINAL DETACHMENT SURGERY      Allergies  Allergies  Allergen Reactions   Other Other (See Comments)    Potassium dichromate, broke out in dry rashes   Penicillins Swelling    Throat swelling - hasn't had since was a teen   Gold Sodium Thiosulfate Rash   Nickel Rash     Labs/Other Studies Reviewed    The following studies were reviewed today: Cardiac cath 2012:  ANGIOGRAPHIC FINDINGS: 1. The left main is a short vessel that trifurcates into an LAD. 2. Ramus intermedius.  It is a large branching ramus intermedius and a     circumflex vessel, which is essentially a large obtuse marginal     branch. 3. The LAD is a moderate-to-large caliber vessel.  It gives rise to     mostly the large septal trunk and then  another diagonal branch and     reaches down toward the apex.  There is no significant disease in     the LAD. 4. The ramus is a large caliber vessel, almost greater than the LAD     itself.  It bifurcates halfway down and branches into several small     branches distally and reaches almost down to the apex.  No disease     noted. 5. The circumflex is again mostly an obtuse marginal branch, which     trifurcates down to its bottom with a very small posterolateral     branch with not much in the way of an atrioventricular groove     vessel.  There is no significant disease in this vessel either. 6. The right coronary artery is a dominant vessel.  There is no     disease down to the crux and then bifurcates into the     posterolateral system of the right atrioventricular groove vessel     branching into 2 posterolateral branches.  No disease in the distal     portion of the vessel either.  The right posterior descending     artery also is a __________ large-sized vessel with no significant     disease.   IMPRESSION: 1. No angiographic evidence of any coronary  artery disease to explain     the abnormal EKG or stress test results. 2. Preserved left ventricular ejection fraction with normal end-     diastolic pressure.   PLAN:  Standard post cath care and we will discharge the patient today. After bedrest, she will follow up with me in clinic just for postcatheterization followup.  We will continue with current medications.     ECHO 10/02/22 IMPRESSIONS    1. Difficult acoustic windows. Hypokkinesis of the mid/distal septal  wall, distal lateral. Hypokinesis of the inferior wall and apex. . Left  ventricular ejection fraction, by estimation, is 35%. The left ventricle  has severely decreased function. There  is mild left ventricular hypertrophy. Left ventricular diastolic  parameters are consistent with Grade I diastolic dysfunction (impaired  relaxation).   2. Right ventricular  systolic function is normal. The right ventricular  size is normal.   3. Trivial mitral valve regurgitation.   4. The aortic valve is tricuspid. Aortic valve regurgitation is not  visualized.   5. The inferior vena cava is normal in size with greater than 50%  respiratory variability, suggesting right atrial pressure of 3 mmHg.    CCTA 09/2022:  IMPRESSION: 1. Coronary calcium score of 0.   2. Normal coronary origin with right dominance.   3. No evidence of CAD.  Non ischemic cardiomyopathy.   CAD-RADS 0.  No evidence of CAD (0%).   Recent Labs: 10/01/2022: ALT 46; BUN 17; Creatinine, Ser 0.92; Hemoglobin 14.5; Platelets 297; Potassium 3.6; Sodium 141  Recent Lipid Panel    Component Value Date/Time   CHOL 206 (H) 10/02/2022 0245   TRIG 72 10/02/2022 0245   HDL 68 10/02/2022 0245   CHOLHDL 3.0 10/02/2022 0245   VLDL 14 10/02/2022 0245   LDLCALC 124 (H) 10/02/2022 0245    History of Present Illness    67 year old female with the above past medical history including chronic LBBB (normal coronaries by showed normal Estella Husk arteries.), cardiomyopathy, hypertension, hyperlipidemia, TIA, and MGUS.  She was seen in 2012 during hospitalization. Cardiac catheterization in the setting of LBBB, abnormal nuclear study, showed normal coronary arteries.  Echocardiogram at the time showed septal wall motion abnormality, consistent with LBBB.  She presented to the ED on 10/01/2022 with right-sided numbness, right-sided facial numbness, elevated BP in the setting of TIA.  CT of the head was negative for acute findings, no evidence of large vessel occlusion on CTA of the head and neck.  Echocardiogram showed EF 35%, hypokinesis of inferior wall and apex, mild LVH, G1 DD, normal RV.  Cardiology was consulted in the setting of abnormal echocardiogram.  EKG showed sinus tachycardia, LBBB, no acute changes.  She denied chest pain, however, she did note some mild dyspnea on exertion. Coronary CTA showed  coronary calcium score of 0, no evidence of CAD.  She was discharged home in stable condition on 10/03/2022.  She returned to the ED on 10/04/2022 with tingling to the right side of her face, right arm and right leg, similar to prior symptoms.  Neurology was consulted, no additional workup was recommended.  She was discharged home in stable condition.  She presents today for follow-up. Since her recent hospitalization she has noted some stable mild dyspnea on exertion, stable overall.  She denies chest pain, palpitations, edema, PND, orthopnea, weight gain.  She continues to note intermittent numbness in her legs.  She has an appointment scheduled for nerve testing in the upcoming weeks.  Other than her  mild dyspnea on exertion, she reports feeling well.  Home Medications    Current Outpatient Medications  Medication Sig Dispense Refill   aspirin EC 81 MG tablet Take 1 tablet (81 mg total) by mouth daily. Swallow whole. 90 tablet 3   carboxymethylcellulose (REFRESH PLUS) 0.5 % SOLN Place 1 drop into both eyes daily as needed (for dry eye).     cholecalciferol (VITAMIN D) 1000 UNITS tablet Take 1,000 Units by mouth daily.     clopidogrel (PLAVIX) 75 MG tablet Take 1 tablet (75 mg total) by mouth daily for 19 days. 19 tablet 0   COVID-19 mRNA vaccine 2023-2024 (COMIRNATY) syringe Inject into the muscle. 0.3 mL 0   cyanocobalamin (VITAMIN B12) 1000 MCG tablet Take 1,000 mcg by mouth every other day.     Estradiol 10 MCG TABS vaginal tablet Place 1 tablet (10 mcg total) vaginally 2 (two) times a week. 24 tablet 1   famotidine (PEPCID) 40 MG tablet Take 40 mg by mouth daily.     losartan (COZAAR) 25 MG tablet Take 0.5 tablets (12.5 mg total) by mouth daily. 15 tablet 0   metoprolol succinate (TOPROL-XL) 25 MG 24 hr tablet Take 1 tablet (25 mg total) by mouth daily. 30 tablet 2   metroNIDAZOLE (METROGEL) 0.75 % gel Apply 1 Application topically daily.     Multiple Vitamins-Minerals (WOMENS MULTIVITAMIN PLUS  PO) Take 1 tablet by mouth daily.     Omega 3 1200 MG CAPS Take 2 capsules by mouth daily.     rosuvastatin (CRESTOR) 40 MG tablet Take 1 tablet (40 mg total) by mouth daily. 30 tablet 2   tretinoin (RETIN-A) 0.025 % cream Apply 1 application  topically at bedtime.     UNABLE TO FIND Take 2 capsules by mouth at bedtime. Bone Up - calcium supplement     No current facility-administered medications for this visit.     Review of Systems    She denies chest pain, palpitations, pnd, orthopnea, n, v, dizziness, syncope, edema, weight gain, or early satiety. All other systems reviewed and are otherwise negative except as noted above.  Physical Exam    VS:  BP 122/80   Pulse 75   Ht 5' 4.5" (1.638 m)   Wt 166 lb 6.4 oz (75.5 kg)   LMP 08/01/2007 (Approximate)   SpO2 98%   BMI 28.12 kg/m   GEN: Well nourished, well developed, in no acute distress. HEENT: normal. Neck: Supple, no JVD, carotid bruits, or masses. Cardiac: RRR, no murmurs, rubs, or gallops. No clubbing, cyanosis, edema.  Radials/DP/PT 2+ and equal bilaterally.  Respiratory:  Respirations regular and unlabored, clear to auscultation bilaterally. GI: Soft, nontender, nondistended, BS + x 4. MS: no deformity or atrophy. Skin: warm and dry, no rash. Neuro:  Strength and sensation are intact. Psych: Normal affect.  Accessory Clinical Findings    ECG personally reviewed by me today -no EKG in office today.    Lab Results  Component Value Date   WBC 8.0 10/01/2022   HGB 14.5 10/01/2022   HCT 42.2 10/01/2022   MCV 87.4 10/01/2022   PLT 297 10/01/2022   Lab Results  Component Value Date   CREATININE 0.92 10/01/2022   BUN 17 10/01/2022   NA 141 10/01/2022   K 3.6 10/01/2022   CL 105 10/01/2022   CO2 26 10/01/2022   Lab Results  Component Value Date   ALT 46 (H) 10/01/2022   AST 32 10/01/2022   ALKPHOS 89 10/01/2022  BILITOT 0.4 10/01/2022   Lab Results  Component Value Date   CHOL 206 (H) 10/02/2022   HDL  68 10/02/2022   LDLCALC 124 (H) 10/02/2022   TRIG 72 10/02/2022   CHOLHDL 3.0 10/02/2022    Lab Results  Component Value Date   HGBA1C 5.6 10/02/2022    Assessment & Plan    1. NICM:  Echo during recent hospitalization showed EF 35%, hypokinesis of inferior wall and apex, mild LVH, G1 DD, normal RV.  Prior cath in 2012 showed normal coronary arteries.  Coronary CTA in 09/2022 showed coronary calcium score of 0, no evidence of CAD.  She notes stable mild dyspnea on exertion, denies edema PND, orthopnea, weight gain.  Euvolemic and well compensated on exam.  Question stress cardiomyopathy.  Further escalation of GDMT limited in the setting of need for permissive hypertension given recent TIA (BP has been running in the 120s/80s). Continue metoprolol, losartan. Will plan for repeat echocardiogram in 3 months.   2. LBBB: Chronic, stable.   3. Hypertension: BP well controlled. Continue current antihypertensive regimen.   4. Hyperlipidemia: LDL was 124 in 09/2022.  Recenlty started on Crestor.  Will plan for repeat fasting lipids, LFTs in 6 to 8 weeks.  Continue Crestor.  5. History of TIA: Following with neurology.  She is to continue Plavix for 3 more weeks per neurology, followed by aspirin alone.  6. Disposition: Follow-up in 3 to 4 months.     Lenna Sciara, NP 10/16/2022, 5:57 PM

## 2022-10-17 ENCOUNTER — Ambulatory Visit: Payer: 59 | Admitting: Neurology

## 2022-10-17 ENCOUNTER — Encounter: Payer: Self-pay | Admitting: Neurology

## 2022-10-17 VITALS — BP 127/71 | HR 79 | Ht 64.0 in | Wt 166.5 lb

## 2022-10-17 DIAGNOSIS — R200 Anesthesia of skin: Secondary | ICD-10-CM

## 2022-10-17 DIAGNOSIS — M4802 Spinal stenosis, cervical region: Secondary | ICD-10-CM | POA: Diagnosis not present

## 2022-10-17 DIAGNOSIS — R9082 White matter disease, unspecified: Secondary | ICD-10-CM

## 2022-10-17 DIAGNOSIS — R269 Unspecified abnormalities of gait and mobility: Secondary | ICD-10-CM

## 2022-10-17 DIAGNOSIS — I429 Cardiomyopathy, unspecified: Secondary | ICD-10-CM

## 2022-10-17 DIAGNOSIS — G459 Transient cerebral ischemic attack, unspecified: Secondary | ICD-10-CM | POA: Diagnosis not present

## 2022-10-17 MED ORDER — ROSUVASTATIN CALCIUM 20 MG PO TABS: 20.0000 mg | ORAL_TABLET | Freq: Every day | ORAL | 3 refills | Status: DC

## 2022-10-17 NOTE — Progress Notes (Signed)
[facility]  PATIENT: Erika Bolton DOB: Oct 18, 1955  REFERRING DOCTOR OR PCP: Lavone Orn, MD; Thayer Ohm, PA-C SOURCE: patient, notes from primary care, imaging and lab reports, MRI images personally reviewed.  _________________________________   HISTORICAL  CHIEF COMPLAINT:  Chief Complaint  Patient presents with   Room 11    Pt is here Alone. Pt had a TIA OR mini stroke 2 weeks ago. Pt states that the right side of her body from top to bottom was numb. Pt states she didn't have any speech issues. Pt states that the numbness on her right has went away. Pt states no headaches or migraines since TIA. Pt states that she was talking to her husband when she felt her right side getting numb.    HISTORY OF PRESENT ILLNESS:  Erika Bolton is a 67 y.o. woman with numbness.  Update 10/17/2022 She had the onset of right face, arm and leg numbness 10/01/2022 and she reported to the ED.   Symptoms improved over 12 hours and were resolved the next day.    She was discharged and symptoms recurred (shorter duration though)the following day so she re-presented to the ED.     MRI 10/01/2022 showed no acute findings and CTA showed normal large vessels.      She denies any stress or anxiety at the time.     She saw Dr. Erlinda Hong who recommended that she be on both ASA and Plavix x 3 weeks and ten just ASA 81 mg.    She had an ECHO showing EF% only 35%.   CT chest showed normal heart size and other structures.   She saw cardiology and they will repeat the Echo in 3 months.    I had seen her in 2023 for leg numbness.   She was found to have moderate cervical spine spinal stenosis but no myelopathic signal.     She is scheduled to have a NCV/EMG with Dr. Brien Few next week.  I have asked her to send the results.    At the time she also notes the right face had intermittent numbness several times   Her gait is fine and she notes no balance issues.   She denies neck pain but has stiffness in her  neck and across her lower back.   She denies bladder changes.       In 2008, she saw Dr. Erling Cruz when she was having some numbness (different than current numbness) at that time an MRI of the brain showed some T2/FLAIR hyperintense foci but they were more consistent with mild chronic microvascular ischemic change than an other etiology.  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  Minimal DDD at L4-L5 mild facet hypertrophy at L4-L5 and L5-S1.  There is no spinal stenosis or nerve root compression.  MRI brain 05/12/2007 showed some small T2/FLAIR hyperintense foci in hemispheres most c/w chronic microvascular ischemic change.    REVIEW OF SYSTEMS: Constitutional: No fevers, chills, sweats, or change in appetite Eyes: No visual changes, double vision, eye pain Ear, nose and throat: No hearing loss, ear pain, nasal congestion, sore throat Cardiovascular: No chest pain, palpitations Respiratory:  No shortness of  breath at rest or with exertion.   No wheezes GastrointestinaI: No nausea, vomiting, diarrhea, abdominal pain, fecal incontinence Genitourinary:  No dysuria, urinary retention or frequency.  No nocturia. Musculoskeletal:  No neck pain, back pain Integumentary: No rash, pruritus, skin lesions Neurological: as above Psychiatric: No depression at this time.  No anxiety Endocrine: No palpitations, diaphoresis, change in appetite, change in weigh or increased thirst Hematologic/Lymphatic:  No anemia, purpura, petechiae. Allergic/Immunologic: No itchy/runny eyes, nasal congestion, recent allergic reactions, rashes  ALLERGIES: Allergies  Allergen Reactions    Other Other (See Comments)    Potassium dichromate, broke out in dry rashes   Penicillins Swelling    Throat swelling - hasn't had since was a teen   Gold Sodium Thiosulfate Rash   Nickel Rash    HOME MEDICATIONS:  Current Outpatient Medications:    aspirin EC 81 MG tablet, Take 1 tablet (81 mg total) by mouth daily. Swallow whole., Disp: 90 tablet, Rfl: 3   carboxymethylcellulose (REFRESH PLUS) 0.5 % SOLN, Place 1 drop into both eyes daily as needed (for dry eye)., Disp: , Rfl:    cholecalciferol (VITAMIN D) 1000 UNITS tablet, Take 1,000 Units by mouth daily., Disp: , Rfl:    clopidogrel (PLAVIX) 75 MG tablet, Take 1 tablet (75 mg total) by mouth daily for 19 days., Disp: 19 tablet, Rfl: 0   COVID-19 mRNA vaccine 2023-2024 (COMIRNATY) syringe, Inject into the muscle., Disp: 0.3 mL, Rfl: 0   cyanocobalamin (VITAMIN B12) 1000 MCG tablet, Take 1,000 mcg by mouth every other day., Disp: , Rfl:    Estradiol 10 MCG TABS vaginal tablet, Place 1 tablet (10 mcg total) vaginally 2 (two) times a week., Disp: 24 tablet, Rfl: 1   famotidine (PEPCID) 40 MG tablet, Take 40 mg by mouth daily., Disp: , Rfl:    losartan (COZAAR) 25 MG tablet, Take 0.5 tablets (12.5 mg total) by mouth daily., Disp: 15 tablet, Rfl: 0   metoprolol succinate (TOPROL-XL) 25 MG 24 hr tablet, Take 1 tablet (25 mg total) by mouth daily., Disp: 30 tablet, Rfl: 2   metroNIDAZOLE (METROGEL) 0.75 % gel, Apply 1 Application topically daily., Disp: , Rfl:    Multiple Vitamins-Minerals (WOMENS MULTIVITAMIN PLUS PO), Take 1 tablet by mouth daily., Disp: , Rfl:    Omega 3 1200 MG CAPS, Take 2 capsules by mouth daily., Disp: , Rfl:    tretinoin (RETIN-A) 0.025 % cream, Apply 1 application  topically at bedtime., Disp: , Rfl:    UNABLE TO FIND, Take 2 capsules by mouth at bedtime. Bone Up - calcium supplement, Disp: , Rfl:    rosuvastatin (CRESTOR) 20 MG tablet, Take 1 tablet (20 mg total) by mouth daily., Disp: 90 tablet, Rfl: 3  PAST  MEDICAL HISTORY: Past Medical History:  Diagnosis Date   Colon polyps 2003   Detached retina    Hypertension    Hypertensive retinopathy    Kidney stone 2016   Left bundle branch block 2012   Menorrhagia    MGUS (monoclonal gammopathy of unknown significance)    Plantar fasciitis    Precancerous lesion 1987   mole excised from vulva   PVD (posterior vitreous detachment), right 09/07/2015   TIA (transient ischemic attack) 2024    PAST SURGICAL HISTORY: Past Surgical History:  Procedure Laterality Date   ABLATION  11/10/2002   Hysteroscopic Thermal   CATARACT EXTRACTION     cyst on scalp     sebaccous(multiple times as a teen)   EYE SURGERY  gastro surgery  07/31/2010   LAPAROSCOPIC CHOLECYSTECTOMY  11/28/2001   left leg muscle surgery  07/31/2010   OTHER SURGICAL HISTORY     Sebaceous cyst on head removed 7x's   RETINAL DETACHMENT SURGERY      FAMILY HISTORY: Family History  Problem Relation Age of Onset   Colon cancer Father 48   Alzheimer's disease Father    Emphysema Father    COPD Father    Heart attack Mother    Cancer Brother        peritod gland cancer   Breast cancer Maternal Aunt        post menopause    SOCIAL HISTORY:  Social History   Socioeconomic History   Marital status: Married    Spouse name: Not on file   Number of children: 2   Years of education: M.ED.   Highest education level: Not on file  Occupational History    Comment: [employer]  Tobacco Use   Smoking status: Never   Smokeless tobacco: Never  Vaping Use   Vaping Use: Never used  Substance and Sexual Activity   Alcohol use: Yes    Alcohol/week: 3.0 standard drinks of alcohol    Types: 3 Glasses of wine per week   Drug use: No   Sexual activity: Not Currently    Partners: Male    Birth control/protection: Post-menopausal  Other Topics Concern   Not on file  Social History Narrative   2 cups of coffee a day    Social Determinants of Health   Financial  Resource Strain: Not on file  Food Insecurity: Not on file  Transportation Needs: Not on file  Physical Activity: Not on file  Stress: Not on file  Social Connections: Not on file  Intimate Partner Violence: Not on file     PHYSICAL EXAM  Vitals:   10/17/22 1532  BP: 127/71  Pulse: 79  Weight: 166 lb 8 oz (75.5 kg)  Height: 5\' 4"  (1.626 m)    Body mass index is 28.58 kg/m.   General: The patient is well-developed and well-nourished and in no acute distress  HEENT:  Head is [place]/AT.  Sclera are anicteric.    Neck: No carotid bruits are noted.  The neck is nontender.  Skin: Extremities are without rash or  edema.  Musculoskeletal:  Back is nontender  Neurologic Exam  Mental status: The patient is alert and oriented x 3 at the time of the examination. The patient has apparent normal recent and remote memory, with an apparently normal attention span and concentration ability.   Speech is normal.  Cranial nerves: Extraocular movements are full. Pupils are equal, round, and reactive to light and accomodation.  Color vision is normal..  Facial symmetry is present. There is good facial sensation to soft touch bilaterally.Facial strength is normal.  Trapezius and sternocleidomastoid strength is normal. No dysarthria is noted.   Normal hearing  Motor:  Muscle bulk is normal.   Tone is normal. Strength is  5 / 5 in all 4 extremities.   Sensory: Sensory testing is intact to pinprick, soft touch and vibration sensation in all 4 extremities.  Coordination: Cerebellar testing reveals good finger-nose-finger and heel-to-shin bilaterally.  Gait and station: Station is normal.   Gait is normal.  Tandem gait is slightly wide.  Romberg is negative.   Reflexes: Deep tendon reflexes are symmetric and normal bilaterally in arms.   She has increased DTRs in legs and clonus at right ankle.  Reflexes were 3 but more symmetric at the knees.      DIAGNOSTIC DATA (LABS, IMAGING, TESTING) - I  reviewed patient records, labs, notes, testing and imaging myself where available.  Lab Results  Component Value Date   WBC 8.0 10/01/2022   HGB 14.5 10/01/2022   HCT 42.2 10/01/2022   MCV 87.4 10/01/2022   PLT 297 10/01/2022      Component Value Date/Time   NA 141 10/01/2022 1700   NA 140 01/09/2017 1523   K 3.6 10/01/2022 1700   K 4.1 01/09/2017 1523   CL 105 10/01/2022 1700   CL 109 (H) 01/10/2013 1446   CO2 26 10/01/2022 1700   CO2 23 01/09/2017 1523   GLUCOSE 119 (H) 10/01/2022 1700   GLUCOSE 94 01/09/2017 1523   GLUCOSE 116 (H) 01/10/2013 1446   BUN 17 10/01/2022 1700   BUN 14.1 01/09/2017 1523   CREATININE 0.92 10/01/2022 1700   CREATININE 0.59 01/14/2021 1512   CREATININE 0.7 01/09/2017 1523   CALCIUM 10.9 (H) 10/01/2022 1700   CALCIUM 10.5 (H) 01/09/2017 1523   PROT 7.9 10/01/2022 1700   PROT 7.1 01/09/2017 1523   PROT 7.5 01/09/2017 1523   ALBUMIN 4.8 10/01/2022 1700   ALBUMIN 4.0 01/09/2017 1523   AST 32 10/01/2022 1700   AST 39 01/14/2021 1512   AST 27 01/09/2017 1523   ALT 46 (H) 10/01/2022 1700   ALT 47 (H) 01/14/2021 1512   ALT 31 01/09/2017 1523   ALKPHOS 89 10/01/2022 1700   ALKPHOS 94 01/09/2017 1523   BILITOT 0.4 10/01/2022 1700   BILITOT 0.4 01/14/2021 1512   BILITOT 0.45 01/09/2017 1523   GFRNONAA >60 10/01/2022 1700   GFRNONAA >60 01/14/2021 1512   GFRAA >60 01/09/2020 1115        ASSESSMENT AND PLAN  TIA (transient ischemic attack)  White matter abnormality on MRI of brain  Gait disturbance  Cervical spinal stenosis  Numbness  Cardiomyopathy, unspecified type (HCC)   Continue ASA/clopidogrel x total 3 weeks.   Then ASA 81 mg daily afterwards. Can do rosuvastatin 20 mg for elevated LDL and vascular risks. Stay active Use oral appliance for OSA She will ask Dr. Brien Few to forward the NCV/EMG  Return in 6 months or sooner if there are new or worsening neurologic symptoms.  40-minute office visit with the majority of the  time spent face-to-face for history and physical, discussion/counseling and decision-making.  Additional time with record review, imaging review and documentation.  Gaven Eugene A. Felecia Shelling, MD, [facility] [id number], [id number] PM Certified in Neurology, Clinical Neurophysiology, Sleep Medicine and Neuroimaging  [facility] [address], [place], [place] [address] [phone]

## 2022-10-19 NOTE — Progress Notes (Shared)
[facility] Note  10/25/2022     CHIEF COMPLAINT Patient presents for Retina Follow Up    HISTORY OF PRESENT ILLNESS: Erika Bolton is a 67 y.o. female who presents to the clinic today for:   HPI     Retina Follow Up   Patient presents with  Other.  In both eyes.  Severity is moderate.  Duration of 5 months.  Since onset it is stable.  I, the attending physician,  performed the HPI with the patient and updated documentation appropriately.        Comments   Pt here for 5 mo ret f/u ERM OU. Pt states VA the same, no changes she's noticed. Pt did suffer a TIA at beginning of March. Had some numbness head to toe on Right side of her body but other than that no lingering issues. Reports there were no issues w/ her brain scan she had done.       Last edited by Bernarda Caffey, MD on 10/26/2022 10:19 PM.    Pt had a TIA at the beginning of March, she states she had head to toe numbness, but no facial drooping, she states her vision feels "fine", she hasn't been aware of any problems, she has an appt with Dr. Valetta Close next month  Referring physician: [name], MD [place], [place] [id number]  HISTORICAL INFORMATION:   Selected notes from the [facility] Referred by Dr. Valetta Close for ERM OU LEE: 02/15/2021 BCVA OD: 20/30 OS: 20/50 Ocular Hx- history of cryo for retinal tear OD, ERM, cataracts PMH- HTN, anxiety    CURRENT MEDICATIONS: Current Outpatient Medications (Ophthalmic Drugs)  Medication Sig   carboxymethylcellulose (REFRESH PLUS) 0.5 % SOLN Place 1 drop into both eyes daily as needed (for dry eye).   No current facility-administered medications for this visit. (Ophthalmic Drugs)   Current Outpatient Medications (Other)  Medication Sig   aspirin EC 81 MG tablet Take 1 tablet (81 mg total) by mouth daily. Swallow whole.   cholecalciferol (VITAMIN D) 1000 UNITS tablet Take 1,000 Units by mouth daily.   COVID-19 mRNA  vaccine 2023-2024 (COMIRNATY) syringe Inject into the muscle.   cyanocobalamin (VITAMIN B12) 1000 MCG tablet Take 1,000 mcg by mouth every other day.   Estradiol 10 MCG TABS vaginal tablet Place 1 tablet (10 mcg total) vaginally 2 (two) times a week.   famotidine (PEPCID) 40 MG tablet Take 40 mg by mouth daily.   losartan (COZAAR) 25 MG tablet Take 0.5 tablets (12.5 mg total) by mouth daily.   metoprolol succinate (TOPROL-XL) 25 MG 24 hr tablet Take 1 tablet (25 mg total) by mouth daily.   metroNIDAZOLE (METROGEL) 0.75 % gel Apply 1 Application topically daily.   Multiple Vitamins-Minerals (WOMENS MULTIVITAMIN PLUS PO) Take 1 tablet by mouth daily.   Omega 3 1200 MG CAPS Take 2 capsules by mouth daily.   rosuvastatin (CRESTOR) 20 MG tablet Take 1 tablet (20 mg total) by mouth daily.   tretinoin (RETIN-A) 0.025 % cream Apply 1 application  topically at bedtime.   UNABLE TO FIND Take 2 capsules by mouth at bedtime. Bone Up - calcium supplement   No current facility-administered medications for this visit. (Other)   REVIEW OF SYSTEMS: ROS   Positive for: Gastrointestinal, Eyes Negative for: Constitutional, Neurological, Skin, Genitourinary, Musculoskeletal, HENT, Endocrine, Cardiovascular, Respiratory, Psychiatric, Allergic/Imm, Heme/Lymph Last edited by Kingsley Spittle, COT on 10/25/2022  1:01 PM.  ALLERGIES Allergies  Allergen Reactions   Other Other (See Comments)    Potassium dichromate, broke out in dry rashes   Penicillins Swelling    Throat swelling - hasn't had since was a teen   Gold Sodium Thiosulfate Rash   Nickel Rash   PAST MEDICAL HISTORY Past Medical History:  Diagnosis Date   Colon polyps 2003   Detached retina    Hypertension    Hypertensive retinopathy    Kidney stone 2016   Left bundle branch block 2012   Menorrhagia    MGUS (monoclonal gammopathy of unknown significance)    Plantar fasciitis    Precancerous lesion 1987   mole excised from vulva    PVD (posterior vitreous detachment), right 09/07/2015   TIA (transient ischemic attack) 2024   Past Surgical History:  Procedure Laterality Date   ABLATION  11/10/2002   Hysteroscopic Thermal   CATARACT EXTRACTION     cyst on scalp     sebaccous(multiple times as a teen)   EYE SURGERY     gastro surgery  07/31/2010   LAPAROSCOPIC CHOLECYSTECTOMY  11/28/2001   left leg muscle surgery  07/31/2010   OTHER SURGICAL HISTORY     Sebaceous cyst on head removed 7x's   RETINAL DETACHMENT SURGERY      FAMILY HISTORY Family History  Problem Relation Age of Onset   Colon cancer Father 73   Alzheimer's disease Father    Emphysema Father    COPD Father    Heart attack Mother    Cancer Brother        peritod gland cancer   Breast cancer Maternal Aunt        post menopause   SOCIAL HISTORY Social History   Tobacco Use   Smoking status: Never   Smokeless tobacco: Never  Vaping Use   Vaping Use: Never used  Substance Use Topics   Alcohol use: Yes    Alcohol/week: 3.0 standard drinks of alcohol    Types: 3 Glasses of wine per week   Drug use: No       OPHTHALMIC EXAM: Base Eye Exam     Visual Acuity (Snellen - Linear)       Right Left   Dist [place]  20/20 -1   Dist cc 20/20     Correction: Contacts  Patient mono vision OD Near with contact in, used Jcard for acuity. OS is DVA. MS        Tonometry (Tonopen, 1:12 PM)       Right Left   Pressure 15 13         Pupils       Dark Light Shape React APD   Right 4 3 Round Brisk None   Left 4 3 Round Brisk None         Visual Fields (Counting fingers)       Left Right    Full Full         Extraocular Movement       Right Left    Full, Ortho Full, Ortho         Neuro/Psych     Oriented x3: Yes   Mood/Affect: Normal         Dilation     Both eyes: 1.0% Mydriacyl, 2.5% Phenylephrine @ 1:13 PM           Slit Lamp and Fundus Exam     Slit Lamp Exam       Right Left   Lids/Lashes  Dermatochalasis - upper lid Dermatochalasis - upper lid, mild MGD   Conjunctiva/Sclera White and quiet White and quiet   Cornea Arcus, well healed temporal cataract wound, 1-2+ focal Punctate epithelial erosions inferiorly Arcus, well healed cataract wound, trace PEE, trace tear film debris   Anterior Chamber deep and clear deep and clear   Iris Round and Dilated Round and Dilated   Lens PC IOL in good position, 1+ Posterior capsular opacification; no pseudophakodonesis PC IOL in good position, 1+ Posterior capsular opacification   Anterior Vitreous Vitreous syneresis, Posterior vitreous detachment, Vitreous condensations Vitreous syneresis, Posterior vitreous detachment, Weiss ring, vitreous condensations         Fundus Exam       Right Left   Disc mild pallor, sharp rim, tilted, temp PPA/PPP Pink and sharp, tilted, temp PPA   C/D Ratio 0.3 0.5   Macula Flat, Blunted foveal reflex, ERM with striae greatest temporal to fovea, focal point of fibrosis temporal to fovea Flat, Good foveal reflex, RPE mottling and clumping, mild ERM, no heme or edema   Vessels mild attenuation, mild tortuosity attenuated, Tortuous   Periphery Attached, Good cryo changes 1100, Inferior pavingstone, no new RT/RD Attached, pigmented pavingstone degeneration inferiorly, No RT/RD           IMAGING AND PROCEDURES  Imaging and Procedures for 10/25/2022  OCT, Retina - OU - Both Eyes       Right Eye Quality was good. Central Foveal Thickness: 303. Progression has been stable. Findings include no IRF, no SRF, abnormal foveal contour, myopic contour, epiretinal membrane, macular pucker (ERM with peri-foveal thickening greatest temporal fovea, myopic contour, mild interval improvement in central foveal contour).   Left Eye Quality was good. Central Foveal Thickness: 273. Progression has been stable. Findings include normal foveal contour, no IRF, no SRF, myopic contour, epiretinal membrane, macular pucker (Mild  ERM/pucker superior macula).   Notes *Images captured and stored on drive  Diagnosis / Impression:  ERM OU (OD>OS) Myopic contour OU OD: ERM with peri-foveal thickening greatest temporal fovea, myopic contour, stable improvement in central foveal contour OS: Mild ERM/pucker superior macula  Clinical management:  See below  Abbreviations: NFP - Normal foveal profile. CME - cystoid macular edema. PED - pigment epithelial detachment. IRF - intraretinal fluid. SRF - subretinal fluid. EZ - ellipsoid zone. ERM - epiretinal membrane. ORA - outer retinal atrophy. ORT - outer retinal tubulation. SRHM - subretinal hyper-reflective material. IRHM - intraretinal hyper-reflective material            ASSESSMENT/PLAN:    ICD-10-CM   1. Epiretinal membrane (ERM) of both eyes  H35.373 OCT, Retina - OU - Both Eyes    2. History of retinal detachment  Z86.69     3. Essential hypertension  I10     4. Hypertensive retinopathy of both eyes  H35.033     5. Pseudophakia of both eyes  Z96.1      1. Epiretinal membrane, OU (OD>OS) - BCVA 20/20 OU -- stable - mild metamorphopsia improved OD - OCT shows stable ERM and stable improvement in foveal contour OD - no indication for surgery at this time - monitor - f/u 6-9 mos, sooner prn-- DFE/OCT   2. History of retinal detachment OD  - s/p pneumatic retinopexy OD 02.24.18 at [facility] w/ Dr. Maryjean Ka  - HST at 1100 -- good cryo  - retina stably reattached, no RT/RD  3,4. Hypertensive retinopathy OU - discussed importance of tight BP control - monitor  5.  Pseudophakia OU  - s/p CE/IOL OU (Dr. Valetta Close - mid-September '22)  - OD with CTR -- no pseudophakodonesis  - IOLs in good position, doing well  - monitor  Ophthalmic Meds Ordered this visit:  No orders of the defined types were placed in this encounter.    Return for f/u 6-9 ERM OU, DFE, OCT.  There are no Patient Instructions on file for this visit.  Explained the diagnoses, plan, and  follow up with the patient and they expressed understanding.  Patient expressed understanding of the importance of proper follow up care.   This document serves as a record of services personally performed by Gardiner Sleeper, MD, PhD. It was created on their behalf by Orvan Falconer, an ophthalmic technician. The creation of this record is the provider's dictation and/or activities during the visit.    Electronically signed by: Orvan Falconer, OA, 10/26/22  10:26 PM  This document serves as a record of services personally performed by Gardiner Sleeper, MD, PhD. It was created on their behalf by San Jetty. Owens Shark, OA an ophthalmic technician. The creation of this record is the provider's dictation and/or activities during the visit.    Electronically signed by: San Jetty. Owens Shark, [place] 03.27.2024 10:26 PM  Gardiner Sleeper, M.D., Ph.D. Diseases & Surgery of the Retina and Vitreous [facility]  I have reviewed the above documentation for accuracy and completeness, and I agree with the above. Gardiner Sleeper, M.D., Ph.D. 10/26/22 10:26 PM  Abbreviations: M myopia (nearsighted); A astigmatism; H hyperopia (farsighted); P presbyopia; Mrx spectacle prescription;  CTL contact lenses; OD right eye; OS left eye; OU both eyes  XT exotropia; ET esotropia; PEK punctate epithelial keratitis; PEE punctate epithelial erosions; DES dry eye syndrome; MGD meibomian gland dysfunction; ATs artificial tears; PFAT's preservative free artificial tears; [place] nuclear sclerotic cataract; PSC posterior subcapsular cataract; ERM epi-retinal membrane; PVD posterior vitreous detachment; RD retinal detachment; DM diabetes mellitus; DR diabetic retinopathy; NPDR non-proliferative diabetic retinopathy; PDR proliferative diabetic retinopathy; CSME clinically significant macular edema; DME diabetic macular edema; dbh dot blot hemorrhages; CWS cotton wool spot; POAG primary open angle glaucoma; C/D cup-to-disc ratio;  HVF humphrey visual field; GVF goldmann visual field; OCT optical coherence tomography; IOP intraocular pressure; BRVO Branch retinal vein occlusion; CRVO central retinal vein occlusion; CRAO central retinal artery occlusion; BRAO branch retinal artery occlusion; RT retinal tear; SB scleral buckle; PPV pars plana vitrectomy; VH Vitreous hemorrhage; PRP panretinal laser photocoagulation; IVK intravitreal kenalog; VMT vitreomacular traction; MH Macular hole;  NVD neovascularization of the disc; NVE neovascularization elsewhere; AREDS age related eye disease study; ARMD age related macular degeneration; POAG primary open angle glaucoma; EBMD epithelial/anterior basement membrane dystrophy; ACIOL anterior chamber intraocular lens; IOL intraocular lens; PCIOL posterior chamber intraocular lens; Phaco/IOL phacoemulsification with intraocular lens placement; [place] photorefractive keratectomy; LASIK laser assisted in situ keratomileusis; HTN hypertension; DM diabetes mellitus; COPD chronic obstructive pulmonary disease

## 2022-10-25 ENCOUNTER — Encounter (INDEPENDENT_AMBULATORY_CARE_PROVIDER_SITE_OTHER): Payer: Self-pay | Admitting: Ophthalmology

## 2022-10-25 ENCOUNTER — Ambulatory Visit (INDEPENDENT_AMBULATORY_CARE_PROVIDER_SITE_OTHER): Payer: 59 | Admitting: Ophthalmology

## 2022-10-25 DIAGNOSIS — Z8669 Personal history of other diseases of the nervous system and sense organs: Secondary | ICD-10-CM

## 2022-10-25 DIAGNOSIS — Z961 Presence of intraocular lens: Secondary | ICD-10-CM

## 2022-10-25 DIAGNOSIS — I10 Essential (primary) hypertension: Secondary | ICD-10-CM | POA: Diagnosis not present

## 2022-10-25 DIAGNOSIS — H35373 Puckering of macula, bilateral: Secondary | ICD-10-CM

## 2022-10-25 DIAGNOSIS — H25813 Combined forms of age-related cataract, bilateral: Secondary | ICD-10-CM

## 2022-10-25 DIAGNOSIS — H35033 Hypertensive retinopathy, bilateral: Secondary | ICD-10-CM

## 2022-10-26 ENCOUNTER — Encounter (INDEPENDENT_AMBULATORY_CARE_PROVIDER_SITE_OTHER): Payer: Self-pay | Admitting: Ophthalmology

## 2022-10-27 ENCOUNTER — Telehealth: Payer: Self-pay | Admitting: Neurology

## 2022-10-27 NOTE — Telephone Encounter (Signed)
I reviewed the NCV/EMG study from 10/23/2022 (Dr. Brien Few)  Nerve conduction studies and EMG of the legs were normal.

## 2022-11-01 ENCOUNTER — Inpatient Hospital Stay: Payer: 59 | Admitting: Neurology

## 2022-11-08 ENCOUNTER — Encounter (INDEPENDENT_AMBULATORY_CARE_PROVIDER_SITE_OTHER): Payer: 59 | Admitting: Ophthalmology

## 2022-12-04 LAB — LIPID PANEL
Chol/HDL Ratio: 2 ratio (ref 0.0–4.4)
Cholesterol, Total: 140 mg/dL (ref 100–199)
HDL: 71 mg/dL (ref >39)
LDL Chol Calc (NIH): 51 mg/dL (ref 0–99)
Triglycerides: 100 mg/dL (ref 0–149)
VLDL Cholesterol Cal: 18 mg/dL (ref 5–40)

## 2022-12-04 LAB — HEPATIC FUNCTION PANEL
ALT: 21 IU/L (ref 0–32)
AST: 24 IU/L (ref 0–40)
Albumin: 4.6 g/dL (ref 3.9–4.9)
Alkaline Phosphatase: 81 IU/L (ref 44–121)
Bilirubin Total: 0.5 mg/dL (ref 0.0–1.2)
Bilirubin, Direct: 0.16 mg/dL (ref 0.00–0.40)
Total Protein: 7.1 g/dL (ref 6.0–8.5)

## 2022-12-05 ENCOUNTER — Other Ambulatory Visit (HOSPITAL_BASED_OUTPATIENT_CLINIC_OR_DEPARTMENT_OTHER): Payer: Self-pay

## 2022-12-05 MED ORDER — FLUOROMETHOLONE 0.1 % OP SUSP
OPHTHALMIC | 0 refills | Status: DC
  Filled 2022-12-05: qty 5, 25d supply, fill #0

## 2022-12-07 ENCOUNTER — Telehealth: Payer: Self-pay

## 2022-12-07 NOTE — Telephone Encounter (Signed)
Spoke with pt. Pt was notified of lab results. Pt will continue her current mediatation and f/u as planned.

## 2022-12-11 ENCOUNTER — Ambulatory Visit (HOSPITAL_COMMUNITY): Payer: Medicare Other | Attending: Cardiology

## 2022-12-11 DIAGNOSIS — I428 Other cardiomyopathies: Secondary | ICD-10-CM | POA: Diagnosis present

## 2022-12-11 LAB — ECHOCARDIOGRAM COMPLETE
Area-P 1/2: 3.13 cm2
S' Lateral: 2.8 cm

## 2022-12-11 MED ORDER — PERFLUTREN LIPID MICROSPHERE
1.0000 mL | INTRAVENOUS | Status: AC | PRN
  Administered 2022-12-11: 2 mL via INTRAVENOUS

## 2022-12-18 ENCOUNTER — Ambulatory Visit (INDEPENDENT_AMBULATORY_CARE_PROVIDER_SITE_OTHER): Payer: Medicare Other | Admitting: Podiatry

## 2022-12-18 ENCOUNTER — Ambulatory Visit (INDEPENDENT_AMBULATORY_CARE_PROVIDER_SITE_OTHER): Payer: Medicare Other

## 2022-12-18 DIAGNOSIS — M79672 Pain in left foot: Secondary | ICD-10-CM

## 2022-12-18 DIAGNOSIS — M722 Plantar fascial fibromatosis: Secondary | ICD-10-CM

## 2022-12-19 ENCOUNTER — Ambulatory Visit: Payer: Medicare Other | Attending: Cardiovascular Disease | Admitting: Cardiovascular Disease

## 2022-12-19 ENCOUNTER — Encounter: Payer: Self-pay | Admitting: Podiatry

## 2022-12-19 ENCOUNTER — Encounter: Payer: Self-pay | Admitting: Cardiovascular Disease

## 2022-12-19 DIAGNOSIS — I10 Essential (primary) hypertension: Secondary | ICD-10-CM | POA: Diagnosis not present

## 2022-12-19 DIAGNOSIS — Z79899 Other long term (current) drug therapy: Secondary | ICD-10-CM

## 2022-12-19 DIAGNOSIS — I428 Other cardiomyopathies: Secondary | ICD-10-CM | POA: Diagnosis not present

## 2022-12-19 DIAGNOSIS — Z8673 Personal history of transient ischemic attack (TIA), and cerebral infarction without residual deficits: Secondary | ICD-10-CM

## 2022-12-19 DIAGNOSIS — E785 Hyperlipidemia, unspecified: Secondary | ICD-10-CM

## 2022-12-19 DIAGNOSIS — I447 Left bundle-branch block, unspecified: Secondary | ICD-10-CM

## 2022-12-19 MED ORDER — METOPROLOL SUCCINATE ER 25 MG PO TB24: 25.0000 mg | ORAL_TABLET | Freq: Every day | ORAL | 3 refills | Status: DC

## 2022-12-19 MED ORDER — LOSARTAN POTASSIUM 25 MG PO TABS: 12.5000 mg | ORAL_TABLET | Freq: Every day | ORAL | 3 refills | Status: DC

## 2022-12-19 MED ORDER — LOSARTAN POTASSIUM 25 MG PO TABS: 12.5000 mg | ORAL_TABLET | Freq: Two times a day (BID) | ORAL | 3 refills | Status: DC

## 2022-12-19 NOTE — Patient Instructions (Addendum)
Medication Instructions:  Your physician has recommended you make the following change in your medication:  INCREASE: Losartan 12.5 mg (half a tablet) twice daily  *If you need a refill on your cardiac medications before your next appointment, please call your pharmacy*   Lab Work: Your physician recommends that you return for lab work in 6 months: CMET, Lipids If you have labs (blood work) drawn today and your tests are completely normal, you will receive your results only by: MyChart Message (if you have MyChart) OR A paper copy in the mail If you have any lab test that is abnormal or we need to change your treatment, we will call you to review the results.   Testing/Procedures: None   Follow-Up: At [facility], you and your health needs are our priority.  As part of our continuing mission to provide you with exceptional heart care, we have created designated Provider Care Teams.  These Care Teams include your primary Cardiologist (physician) and Advanced Practice Providers (APPs -  Physician Assistants and Nurse Practitioners) who all work together to provide you with the care you need, when you need it.  Your next appointment:   6 month(s)  Provider:   Nicki Gu[place], MD

## 2022-12-19 NOTE — Progress Notes (Signed)
Cardiology Office Note    Date:  12/26/2022   ID:  Erika Bolton, Erika Bolton [birth year], MRN [id number]  PCP:  Thana Ates, MD  Cardiologist:  Nicki Guadalajara, MD   Initial office visit with me following her recent hospitalization from March 3 to October 03, 2022.   History of Present Illness:  Erika Bolton is a 67 y.o. female who had undergone cardiac catheterization in 2012 in the setting of left bundle branch block and an abnormal nuclear study.  Catheterization revealed normal coronary arteries.  An echo Doppler study was consistent with septal wall abnormality consistent with left bundle branch block.  Patient had done well until October 01, 2022 when she presented to the hospital with right-sided numbness, right-sided facial numbness, elevated blood pressure in the setting of TIA.  Head CT was negative for acute findings.  There was no evidence of large vessel occlusion on CTA of the head and neck.  An echo Doppler study revealed EF at 35% with hypokinesis of the inferior wall and apex, mild LVH, grade 1 diastolic dysfunction, normal RV pressure.  She also underwent coronary CTA which was negative for CAD.  Calcium score was 0.  She was seen by me during her hospitalization.  Guideline directed medical therapy was limited in the setting of lower blood pressure and recent TIA.  She was on metoprolol 12.5 twice a day and losartan 12.5 mg.  She was seen by Dr. Roda Shutters of neurology and it was recommended that she be on aspirin and Plavix for 3 weeks and then aspirin alone at 81 mg.  She was subsequently seen by Bernadene Person, NP on October 16, 2022 and was euvolemic and well compensated on exam.  Subsequently, she saw Dr.Sater of neurology as an outpatient on October 17, 2022.  It was recommended that she initiate rosuvastatin 20 mg, use oral appliance for OSA, and have nerve conduction velocity/EMG evaluation.  Presently, Erika Bolton feels well.  She underwent a follow-up echo Doppler study on Dec 11, 2022.  This now  shows improvement in LV function with EF now at 50 to 55% (by 3D volume at 50%).  There were no wall motion abnormalities.  There was grade 1 diastolic dysfunction.  RV systolic function was normal.  Valves were normal.  She states her blood pressure last week was running 1 20-1 40 systolically.  Presently she is on losartan at bedtime 12.5 mg daily, metoprolol succinate 25 mg and rosuvastatin 20 mg in addition to baby aspirin.  She takes Pepcid as needed for dyspepsia.  She presents for evaluation.    Past Medical History:  Diagnosis Date   Colon polyps 2003   Detached retina    Hypertension    Hypertensive retinopathy    Kidney stone 2016   Left bundle branch block 2012   Menorrhagia    MGUS (monoclonal gammopathy of unknown significance)    Plantar fasciitis    Precancerous lesion 1987   mole excised from vulva   PVD (posterior vitreous detachment), right 09/07/2015   TIA (transient ischemic attack) 2024    Past Surgical History:  Procedure Laterality Date   ABLATION  11/10/2002   Hysteroscopic Thermal   CATARACT EXTRACTION     cyst on scalp     sebaccous(multiple times as a teen)   EYE SURGERY     gastro surgery  07/31/2010   LAPAROSCOPIC CHOLECYSTECTOMY  11/28/2001   left leg muscle surgery  07/31/2010   OTHER SURGICAL HISTORY  Sebaceous cyst on head removed 7x's   RETINAL DETACHMENT SURGERY      Current Medications: Outpatient Medications Prior to Visit  Medication Sig Dispense Refill   aspirin EC 81 MG tablet Take 1 tablet (81 mg total) by mouth daily. Swallow whole. 90 tablet 3   carboxymethylcellulose (REFRESH PLUS) 0.5 % SOLN Place 1 drop into both eyes daily as needed (for dry eye).     cholecalciferol (VITAMIN D) 1000 UNITS tablet Take 1,000 Units by mouth daily.     cyanocobalamin (VITAMIN B12) 1000 MCG tablet Take 1,000 mcg by mouth every other day.     Estradiol 10 MCG TABS vaginal tablet Place 1 tablet (10 mcg total) vaginally 2 (two) times a week. 24  tablet 1   famotidine (PEPCID) 40 MG tablet Take 40 mg by mouth daily.     fluorometholone (FML) 0.1 % ophthalmic suspension Instill 1 drop into left eye four times daily for 7 days. Start after surgery. 5 mL 0   metroNIDAZOLE (METROGEL) 0.75 % gel Apply 1 Application topically daily.     Multiple Vitamins-Minerals (WOMENS MULTIVITAMIN PLUS PO) Take 1 tablet by mouth daily.     Omega 3 1200 MG CAPS Take 2 capsules by mouth daily.     rosuvastatin (CRESTOR) 20 MG tablet Take 1 tablet (20 mg total) by mouth daily. 90 tablet 3   tretinoin (RETIN-A) 0.025 % cream Apply 1 application  topically at bedtime.     UNABLE TO FIND Take 2 capsules by mouth at bedtime. Bone Up - calcium supplement     metoprolol succinate (TOPROL-XL) 25 MG 24 hr tablet Take 1 tablet (25 mg total) by mouth daily. 30 tablet 2   COVID-19 mRNA vaccine 2023-2024 (COMIRNATY) syringe Inject into the muscle. (Patient not taking: Reported on 12/19/2022) 0.3 mL 0   losartan (COZAAR) 25 MG tablet Take 0.5 tablets (12.5 mg total) by mouth daily. 15 tablet 0   No facility-administered medications prior to visit.     Allergies:   Other, Penicillins, Gold sodium thiosulfate, and Nickel   Social History   Socioeconomic History   Marital status: Married    Spouse name: Not on file   Number of children: 2   Years of education: M.ED.   Highest education level: Not on file  Occupational History    Comment: [employer]  Tobacco Use   Smoking status: Never   Smokeless tobacco: Never  Vaping Use   Vaping Use: Never used  Substance and Sexual Activity   Alcohol use: Yes    Alcohol/week: 3.0 standard drinks of alcohol    Types: 3 Glasses of wine per week   Drug use: No   Sexual activity: Not Currently    Partners: Male    Birth control/protection: Post-menopausal  Other Topics Concern   Not on file  Social History Narrative   2 cups of coffee a day    Social Determinants of Health   Financial Resource Strain: Not on  file  Food Insecurity: Not on file  Transportation Needs: Not on file  Physical Activity: Not on file  Stress: Not on file  Social Connections: Not on file     Family History:  The patient's family history includes Alzheimer's disease in her father; Breast cancer in her maternal aunt; COPD in her father; Cancer in her brother; Colon cancer (age of onset: 37) in her father; Emphysema in her father; Heart attack in her mother.   ROS General: Negative; No fevers, chills, or  night sweats;  HEENT: Negative; No changes in vision or hearing, sinus congestion, difficulty swallowing Pulmonary: Negative; No cough, wheezing, shortness of breath, hemoptysis Cardiovascular: Negative; No chest pain, presyncope, syncope, palpitations GI: Negative; No nausea, vomiting, diarrhea, or abdominal pain GU: Negative; No dysuria, hematuria, or difficulty voiding Musculoskeletal: Negative; no myalgias, joint pain, or weakness Hematologic/Oncology: Negative; no easy bruising, bleeding Endocrine: Negative; no heat/cold intolerance; no diabetes Neuro: Recent TIA Skin: Negative; No rashes or skin lesions Psychiatric: Negative; No behavioral problems, depression Sleep: Negative; No snoring, daytime sleepiness, hypersomnolence, bruxism, restless legs, hypnogognic hallucinations, no cataplexy Other comprehensive 14 point system review is negative.   PHYSICAL EXAM:   VS:  BP 118/78   Pulse 81   Ht 5' 4.5" (1.638 m)   Wt 173 lb (78.5 kg)   LMP 08/01/2007 (Approximate)   SpO2 94%   BMI 29.24 kg/m     Repeat blood pressure by me 126/80  Wt Readings from Last 3 Encounters:  12/19/22 173 lb (78.5 kg)  10/17/22 166 lb 8 oz (75.5 kg)  10/16/22 166 lb 6.4 oz (75.5 kg)    General: Alert, oriented, no distress.  Skin: normal turgor, no rashes, warm and dry HEENT: Normocephalic, atraumatic. Pupils equal round and reactive to light; sclera anicteric; extraocular muscles intact;  Nose without nasal septal  hypertrophy Mouth/Parynx benign; Mallinpatti scale 3 Neck: No JVD, no carotid bruits; normal carotid upstroke Lungs: clear to ausculatation and percussion; no wheezing or rales Chest wall: without tenderness to palpitation Heart: PMI not displaced, RRR, s1 s2 normal, 1/6 systolic murmur, no diastolic murmur, no rubs, gallops, thrills, or heaves Abdomen: soft, nontender; no hepatosplenomehaly, BS+; abdominal aorta nontender and not dilated by palpation. Back: no CVA tenderness Pulses 2+ Musculoskeletal: full range of motion, normal strength, no joint deformities Extremities: no clubbing cyanosis or edema, Homan's sign negative  Neurologic: grossly nonfocal; Cranial nerves grossly wnl Psychologic: Normal mood and affect   Studies/Labs Reviewed:   Dec 19, 2022 ECG (independently read by me): NSR at 81, LBBB, QTc 476 msec  Recent Labs:    Latest Ref Rng & Units 10/01/2022    5:00 PM 01/10/2022   11:31 AM 01/14/2021    3:12 PM  BMP  Glucose 70 - 99 mg/dL 578  469  629   BUN 8 - 23 mg/dL 17  11  16    Creatinine 0.44 - 1.00 mg/dL 5.28  4.13  2.44   Sodium 135 - 145 mmol/L 141  138  138   Potassium 3.5 - 5.1 mmol/L 3.6  4.0  4.0   Chloride 98 - 111 mmol/L 105  105  109   CO2 22 - 32 mmol/L 26  27  23    Calcium 8.9 - 10.3 mg/dL 01.0  27.2  53.6         Latest Ref Rng & Units 12/04/2022    9:18 AM 10/01/2022    5:00 PM 01/10/2022   11:31 AM  Hepatic Function  Total Protein 6.0 - 8.5 g/dL 7.1  7.9  7.8   Albumin 3.9 - 4.9 g/dL 4.6  4.8  4.6   AST 0 - 40 IU/L 24  32  23   ALT 0 - 32 IU/L 21  46  32   Alk Phosphatase 44 - 121 IU/L 81  89  97   Total Bilirubin 0.0 - 1.2 mg/dL 0.5  0.4  0.6   Bilirubin, Direct 0.00 - 0.40 mg/dL 6.44  Latest Ref Rng & Units 10/01/2022    5:00 PM 01/10/2022   11:31 AM 01/14/2021    3:12 PM  CBC  WBC 4.0 - 10.5 K/uL 8.0  5.6  6.6   Hemoglobin 12.0 - 15.0 g/dL 16.1  09.6  04.5   Hematocrit 36.0 - 46.0 % 42.2  43.7  40.7   Platelets 150 - 400 K/uL  297  300  240    Lab Results  Component Value Date   MCV 87.4 10/01/2022   MCV 86.5 01/10/2022   MCV 87.3 01/14/2021   No results found for: "TSH" Lab Results  Component Value Date   HGBA1C 5.6 10/02/2022     BNP No results found for: "BNP"  ProBNP No results found for: "PROBNP"   Lipid Panel     Component Value Date/Time   CHOL 140 12/04/2022 0918   TRIG 100 12/04/2022 0918   HDL 71 12/04/2022 0918   CHOLHDL 2.0 12/04/2022 0918   CHOLHDL 3.0 10/02/2022 0245   VLDL 14 10/02/2022 0245   LDLCALC 51 12/04/2022 0918   LABVLDL 18 12/04/2022 0918     RADIOLOGY: ECHOCARDIOGRAM COMPLETE  Result Date: 12/11/2022    ECHOCARDIOGRAM REPORT   Patient Name:   Erika Bolton Date of Exam: 12/11/2022 Medical Rec #:  [id number]      Height:       64.0 in Accession #:    [id number]     Weight:       166.5 lb Date of Birth:  [birth year]      BSA:          1.810 m Patient Age:    67 years       BP:           127/71 mmHg Patient Gender: F              HR:           78 bpm. Exam Location:  [address] Procedure: 2D Echo, 3D Echo, Cardiac Doppler, Color Doppler and Intracardiac            Opacification Agent Indications:    I42.80 Non-ischemic cardiomyopathy  History:        Patient has prior history of Echocardiogram examinations, most                 recent 10/02/2022. Cardiomyopathy, TIA, Arrythmias:LBBB; Risk                 Factors:Hypertension and Dyslipidemia.  Sonographer:    Cathie Beams RCS Referring Phys: [phone] EMILY C MONGE IMPRESSIONS  1. Left ventricular ejection fraction, by estimation, is 50 to 55%. Left ventricular ejection fraction by 3D volume is 50 %. The left ventricle has low normal function. The left ventricle has no regional wall motion abnormalities. Left ventricular diastolic parameters are consistent with Grade I diastolic dysfunction (impaired relaxation).  2. Right ventricular systolic function is normal. The right ventricular size is normal. Tricuspid regurgitation signal  is inadequate for assessing PA pressure.  3. The mitral valve is normal in structure. No evidence of mitral valve regurgitation. No evidence of mitral stenosis.  4. The aortic valve is normal in structure. Aortic valve regurgitation is not visualized. No aortic stenosis is present.  5. The inferior vena cava is normal in size with greater than 50% respiratory variability, suggesting right atrial pressure of 3 mmHg. FINDINGS  Left Ventricle: Left ventricular ejection fraction, by estimation, is 50 to 55%. Left ventricular ejection fraction by  3D volume is 50 %. The left ventricle has low normal function. The left ventricle has no regional wall motion abnormalities. The left ventricular internal cavity size was normal in size. There is no left ventricular hypertrophy. Abnormal (paradoxical) septal motion, consistent with left bundle branch block. Left ventricular diastolic parameters are consistent with Grade I diastolic dysfunction (impaired relaxation). Normal left ventricular filling pressure. Right Ventricle: The right ventricular size is normal. No increase in right ventricular wall thickness. Right ventricular systolic function is normal. Tricuspid regurgitation signal is inadequate for assessing PA pressure. Left Atrium: Left atrial size was normal in size. Right Atrium: Right atrial size was normal in size. Pericardium: There is no evidence of pericardial effusion. Mitral Valve: The mitral valve is normal in structure. No evidence of mitral valve regurgitation. No evidence of mitral valve stenosis. Tricuspid Valve: The tricuspid valve is normal in structure. Tricuspid valve regurgitation is not demonstrated. No evidence of tricuspid stenosis. Aortic Valve: The aortic valve is normal in structure. Aortic valve regurgitation is not visualized. No aortic stenosis is present. Pulmonic Valve: The pulmonic valve was normal in structure. Pulmonic valve regurgitation is trivial. No evidence of pulmonic stenosis.  Aorta: The aortic root is normal in size and structure. Venous: The inferior vena cava is normal in size with greater than 50% respiratory variability, suggesting right atrial pressure of 3 mmHg. IAS/Shunts: No atrial level shunt detected by color flow Doppler.  LEFT VENTRICLE PLAX 2D LVIDd:         4.30 cm         Diastology LVIDs:         2.80 cm         LV e' medial:    5.55 cm/s LV PW:         1.10 cm         LV E/e' medial:  10.9 LV IVS:        1.20 cm         LV e' lateral:   6.42 cm/s LVOT diam:     2.20 cm         LV E/e' lateral: 9.4 LV SV:         66 LV SV Index:   37 LVOT Area:     3.80 cm        3D Volume EF                                LV 3D EF:    Left                                             ventricul                                             ar                                             ejection  fraction                                             by 3D                                             volume is                                             50 %.                                 3D Volume EF:                                3D EF:        50 %                                LV EDV:       151 ml                                LV ESV:       75 ml                                LV SV:        76 ml RIGHT VENTRICLE RV Basal diam:  2.50 cm RV Mid diam:    1.80 cm RV S prime:     12.20 cm/s TAPSE (M-mode): 2.3 cm LEFT ATRIUM           Index        RIGHT ATRIUM           Index LA diam:      3.40 cm 1.88 cm/m   RA Area:     10.10 cm LA Vol (A2C): 45.6 ml 25.20 ml/m  RA Volume:   18.70 ml  10.33 ml/m LA Vol (A4C): 17.0 ml 9.39 ml/m  AORTIC VALVE LVOT Vmax:   99.80 cm/s LVOT Vmean:  66.000 cm/s LVOT VTI:    0.174 m  AORTA Ao Root diam: 3.50 cm Ao Asc diam:  3.50 cm MITRAL VALVE MV Area (PHT): 3.13 cm    SHUNTS MV Decel Time: 242 msec    Systemic VTI:  0.17 m MV E velocity: 60.30 cm/s  Systemic Diam: 2.20 cm MV A velocity: 81.80 cm/s MV E/A ratio:   0.74 Armanda Magic MD Electronically signed by Armanda Magic MD Signature Date/Time: 12/11/2022/8:08:08 PM    Final      Additional studies/ records that were reviewed today include:   ECHO 10/02/22  1. Difficult acoustic windows. Hypokkinesis of the mid/distal septal  wall, distal lateral. Hypokinesis of the inferior wall and apex. . Left  ventricular ejection fraction, by estimation, is 35%. The left ventricle  has severely decreased function. There  is mild left ventricular hypertrophy. Left ventricular diastolic  parameters are consistent with Grade I diastolic dysfunction (impaired  relaxation).   2. Right ventricular systolic function is normal. The right ventricular  size is normal.   3. Trivial mitral valve regurgitation.   4. The aortic valve is tricuspid. Aortic valve regurgitation is not  visualized.   5. The inferior vena cava is normal in size with greater than 50%  respiratory variability, suggesting right atrial pressure of 3 mmHg.     CCTA 09/2022:  1. Coronary calcium score of 0.   2. Normal coronary origin with right dominance.   3. No evidence of CAD.  Non ischemic cardiomyopathy.   CAD-RADS 0.  No evidence of CAD (0%).   ECHO: 12/11/2022  1. Left ventricular ejection fraction, by estimation, is 50 to 55%. Left  ventricular ejection fraction by 3D volume is 50 %. The left ventricle has  low normal function. The left ventricle has no regional wall motion  abnormalities. Left ventricular  diastolic parameters are consistent with Grade I diastolic dysfunction  (impaired relaxation).   2. Right ventricular systolic function is normal. The right ventricular  size is normal. Tricuspid regurgitation signal is inadequate for assessing  PA pressure.   3. The mitral valve is normal in structure. No evidence of mitral valve  regurgitation. No evidence of mitral stenosis.   4. The aortic valve is normal in structure. Aortic valve regurgitation is  not visualized. No aortic  stenosis is present.   5. The inferior vena cava is normal in size with greater than 50%  respiratory variability, suggesting right atrial pressure of 3 mmHg.     ASSESSMENT:    1. History of TIA (transient ischemic attack)   2. Hyperlipidemia LDL goal <70   3. Essential hypertension   4. NICM (nonischemic cardiomyopathy) (HCC)   5. LBBB (left bundle branch block)   6. Medication management     PLAN:  Erika Bolton "Pattricia Boss" is a 67 year old female who had previously undergone cardiac catheterization in 2012 which showed normal coronary arteries.  She has a history of left bundle branch block, hypertension, hyperlipidemia, and suffered a TIA leading to hospitalization on October 01, 2022.  She presented with right sided numbness, elevated BP in the setting of her TIA.  I reviewed the findings of her hospitalization.  An echo Doppler study initially showed EF estimated at 35% with mild LVH and grade 1 diastolic dysfunction.  Coronary CTA had calcium score of 0.  She was felt to have a nonischemic cardiomyopathy.  And was treated with aspirin/Plavix for 3 weeks and then changed to just aspirin alone.  She also was treated with low-dose losartan 12.5 mg in addition to metoprolol succinate 25 mg daily.  A 38-month follow-up echo Doppler study was done on Dec 11, 2022 chart reviewed with her today in detail.  This now shows improvement in LV function with EF estimated at 50 to 55%.  She did not have any wall motion abnormalities and had grade 1 diastolic dysfunction.  Valves were essentially normal.  Presently, her blood pressure is stable but she states last week her blood pressure is typically running in the 120s to 140.  As result, I will slightly titrate her losartan dose to 12.5 mg twice a day.  She is now on rosuvastatin 20 mg per lipidemia target LDL less than 70 and is on aspirin 81 mg.  Laboratory on Dec 04, 2022 shows total cholesterol 140, HDL 71,  LDL 51, and triglycerides 332.  Hemoglobin A1c  on October 01, 2020 was normal at 5.6.  She will continue current therapy.  She will follow-up with Dr. Bertis Ruddy as well as Dr. Epimenio Foot.  I will see her in 6 months for cardiology reevaluation.   Medication Adjustments/Labs and Tests Ordered: Current medicines are reviewed at length with the patient today.  Concerns regarding medicines are outlined above.  Medication changes, Labs and Tests ordered today are listed in the Patient Instructions below. Patient Instructions  Medication Instructions:  Your physician has recommended you make the following change in your medication:  INCREASE: Losartan 12.5 mg (half a tablet) twice daily  *If you need a refill on your cardiac medications before your next appointment, please call your pharmacy*   Lab Work: Your physician recommends that you return for lab work in 6 months: CMET, Lipids If you have labs (blood work) drawn today and your tests are completely normal, you will receive your results only by: MyChart Message (if you have MyChart) OR A paper copy in the mail If you have any lab test that is abnormal or we need to change your treatment, we will call you to review the results.   Testing/Procedures: None   Follow-Up: At [facility], you and your health needs are our priority.  As part of our continuing mission to provide you with exceptional heart care, we have created designated Provider Care Teams.  These Care Teams include your primary Cardiologist (physician) and Advanced Practice Providers (APPs -  Physician Assistants and Nurse Practitioners) who all work together to provide you with the care you need, when you need it.  Your next appointment:   6 month(s)  Provider:   Nicki Guadalajara, MD    Signed, Nicki Guadalajara, MD  12/26/2022 2:40 PM    [facility] [address], [address], [place], [place]  [address] Phone: [phone]

## 2022-12-20 ENCOUNTER — Encounter: Payer: Self-pay | Admitting: Podiatry

## 2022-12-21 ENCOUNTER — Telehealth: Payer: Self-pay

## 2022-12-21 NOTE — Telephone Encounter (Signed)
Spoke with pt. Pt was notified of echo results. Pt will continue current medication and f/u as planned.  

## 2022-12-22 ENCOUNTER — Other Ambulatory Visit: Payer: Self-pay | Admitting: Podiatry

## 2022-12-22 DIAGNOSIS — M722 Plantar fascial fibromatosis: Secondary | ICD-10-CM

## 2022-12-22 MED ORDER — METHYLPREDNISOLONE 4 MG PO TBPK: ORAL_TABLET | ORAL | 0 refills | Status: DC

## 2022-12-22 MED ORDER — MELOXICAM 7.5 MG PO TABS: 7.5000 mg | ORAL_TABLET | Freq: Every day | ORAL | 0 refills | Status: DC

## 2022-12-22 NOTE — Progress Notes (Unsigned)
Chief Complaint  Patient presents with   Foot Pain    Patient came in today for left foot heel pain, started 2 moths ago, rate of pain 4 out of 10, sharp pain, patient does has a history of P.F. and had surgery over 10 years ago, X-Rays done today     Subjective: 67 y.o. female presenting today as a new patient for evaluation of left heel pain has been ongoing for few months now.  Idiopathic onset.  Patient does have a history of plantar fasciitis 10 years ago.  Currently she has not anything for treatment.   Past Medical History:  Diagnosis Date   Colon polyps 2003   Detached retina    Hypertension    Hypertensive retinopathy    Kidney stone 2016   Left bundle branch block 2012   Menorrhagia    MGUS (monoclonal gammopathy of unknown significance)    Plantar fasciitis    Precancerous lesion 1987   mole excised from vulva   PVD (posterior vitreous detachment), right 09/07/2015   TIA (transient ischemic attack) 2024   Past Surgical History:  Procedure Laterality Date   ABLATION  11/10/2002   Hysteroscopic Thermal   CATARACT EXTRACTION     cyst on scalp     sebaccous(multiple times as a teen)   EYE SURGERY     gastro surgery  07/31/2010   LAPAROSCOPIC CHOLECYSTECTOMY  11/28/2001   left leg muscle surgery  07/31/2010   OTHER SURGICAL HISTORY     Sebaceous cyst on head removed 7x's   RETINAL DETACHMENT SURGERY     Allergies  Allergen Reactions   Other Other (See Comments)    Potassium dichromate, broke out in dry rashes   Penicillins Swelling    Throat swelling - hasn't had since was a teen   Gold Sodium Thiosulfate Rash   Nickel Rash     Objective: Physical Exam General: The patient is alert and oriented x3 in no acute distress.  Dermatology: Skin is warm, dry and supple bilateral lower extremities. Negative for open lesions or macerations bilateral.   Vascular: Dorsalis Pedis and Posterior Tibial pulses palpable bilateral.  Capillary fill time is immediate  to all digits.  Neurological: Grossly intact via light touch  Musculoskeletal: Tenderness to palpation to the plantar aspect of the left heel along the plantar fascia. All other joints range of motion within normal limits bilateral. Strength 5/5 in all groups bilateral.   Radiographic exam LT foot 12/18/2022: Normal osseous mineralization. Joint spaces preserved. No fracture/dislocation/boney destruction. No other soft tissue abnormalities or radiopaque foreign bodies.  Os peroneum noted  Assessment: 1. Plantar fasciitis left foot 2.  History of endoscopic plantar fasciotomy left x 10 years ago  Plan of Care:  1. Patient evaluated. Xrays reviewed.   2. Injection of 0.5cc Celestone soluspan injected into the left plantar fascia.  3. Rx for Medrol Dose Pak  4. Rx for Meloxicam 15 mg daily after completion of the Dosepak 5.  Recommend good supportive shoes and sneakers.  Advised against going barefoot 6. Instructed patient regarding therapies and modalities at home to alleviate symptoms.  7. Return to clinic in 4 weeks.     Felecia Shelling, DPM [facility]  Dr. Felecia Shelling, DPM    [address]  [place], [place] [address]                Office [phone]  Fax [phone]

## 2022-12-22 NOTE — Progress Notes (Signed)
Patient called noting that she saw Dr. Logan Bores on Monday, and had a good visit with him.  Two prescriptions were supposed to have been sent in, but her pharmacy states they were not.  She knew one was for prednisone pack and the other was for NSAID.    Apologized to patient for the delay in getting her meds to her.  I personally sent in a Medrol Dose pack and meloxicam 7.5mg  Rx for her to [facility] [address] after our phone conversation.  She also wants to go to physical therapy at [facility] @[address]  [facility] in [place] [phone], [phone]# [phone]).

## 2022-12-26 ENCOUNTER — Encounter: Payer: Self-pay | Admitting: Cardiovascular Disease

## 2022-12-26 DIAGNOSIS — M722 Plantar fascial fibromatosis: Secondary | ICD-10-CM | POA: Diagnosis not present

## 2022-12-26 MED ORDER — BETAMETHASONE SOD PHOS & ACET 6 (3-3) MG/ML IJ SUSP
3.0000 mg | Freq: Once | INTRAMUSCULAR | Status: AC
  Administered 2022-12-26: 3 mg via INTRA_ARTICULAR

## 2022-12-29 ENCOUNTER — Other Ambulatory Visit: Payer: Self-pay | Admitting: Obstetrics and Gynecology

## 2022-12-29 ENCOUNTER — Other Ambulatory Visit: Payer: Self-pay | Admitting: Podiatry

## 2022-12-29 DIAGNOSIS — M79672 Pain in left foot: Secondary | ICD-10-CM

## 2022-12-29 DIAGNOSIS — M722 Plantar fascial fibromatosis: Secondary | ICD-10-CM

## 2022-12-29 NOTE — Telephone Encounter (Signed)
Med refill request:estradiol 10 mcg vag tab twice wkly Last AEX: 02/22/22 -JJ Next AEX: 03/14/23 -BS Last MMG (if hormonal med) 08/28/22 BiRads 1 neg  Rx pended #24/0RF  Refill authorized: Please Advise?

## 2023-01-03 ENCOUNTER — Telehealth: Payer: Self-pay | Admitting: Podiatry

## 2023-01-03 NOTE — Telephone Encounter (Signed)
Pt called upset because it took a week for pt to get her medication from her 1st appt. She said she thought it was me that helped her last time. She was also to have a referral to physical therapy and [facility] is stating they did not get the referral.   I apologized and checked chart and there were 2 referral in there one from Dr Logan Bores and one from Dr Carlota Raspberry. [facility] requested that the referral get faxed to them. I confirmed the fax number and faxed the referral with office note and demographics to them. Pt stated you get things done and said thank you. She did also ask for my name and I gave it to her.

## 2023-01-18 ENCOUNTER — Inpatient Hospital Stay: Payer: Medicare Other | Attending: Hematology and Oncology

## 2023-01-18 ENCOUNTER — Other Ambulatory Visit: Payer: Self-pay

## 2023-01-18 DIAGNOSIS — D472 Monoclonal gammopathy: Secondary | ICD-10-CM | POA: Diagnosis not present

## 2023-01-18 DIAGNOSIS — Z7982 Long term (current) use of aspirin: Secondary | ICD-10-CM | POA: Diagnosis not present

## 2023-01-18 DIAGNOSIS — Z79899 Other long term (current) drug therapy: Secondary | ICD-10-CM | POA: Diagnosis not present

## 2023-01-18 DIAGNOSIS — E559 Vitamin D deficiency, unspecified: Secondary | ICD-10-CM

## 2023-01-18 LAB — COMPREHENSIVE METABOLIC PANEL
ALT: 21 U/L (ref 0–44)
AST: 22 U/L (ref 15–41)
Albumin: 4.1 g/dL (ref 3.5–5.0)
Alkaline Phosphatase: 70 U/L (ref 38–126)
Anion gap: 4 — ABNORMAL LOW (ref 5–15)
BUN: 14 mg/dL (ref 8–23)
CO2: 28 mmol/L (ref 22–32)
Calcium: 10.7 mg/dL — ABNORMAL HIGH (ref 8.9–10.3)
Chloride: 104 mmol/L (ref 98–111)
Creatinine, Ser: 0.73 mg/dL (ref 0.44–1.00)
GFR, Estimated: >60 mL/min (ref >60)
Glucose, Bld: 107 mg/dL — ABNORMAL HIGH (ref 70–99)
Potassium: 4.3 mmol/L (ref 3.5–5.1)
Sodium: 136 mmol/L (ref 135–145)
Total Bilirubin: 0.7 mg/dL (ref 0.3–1.2)
Total Protein: 6.8 g/dL (ref 6.5–8.1)

## 2023-01-18 LAB — CBC WITH DIFFERENTIAL/PLATELET
Abs Immature Granulocytes: 0.02 10*3/uL (ref 0.00–0.07)
Basophils Absolute: 0 10*3/uL (ref 0.0–0.1)
Basophils Relative: 1 %
Eosinophils Absolute: 0.2 10*3/uL (ref 0.0–0.5)
Eosinophils Relative: 5 %
HCT: 40.8 % (ref 36.0–46.0)
Hemoglobin: 13.7 g/dL (ref 12.0–15.0)
Immature Granulocytes: 0 %
Lymphocytes Relative: 26 %
Lymphs Abs: 1.3 10*3/uL (ref 0.7–4.0)
MCH: 30.4 pg (ref 26.0–34.0)
MCHC: 33.6 g/dL (ref 30.0–36.0)
MCV: 90.7 fL (ref 80.0–100.0)
Monocytes Absolute: 0.5 10*3/uL (ref 0.1–1.0)
Monocytes Relative: 9 %
Neutro Abs: 2.9 10*3/uL (ref 1.7–7.7)
Neutrophils Relative %: 59 %
Platelets: 240 10*3/uL (ref 150–400)
RBC: 4.5 MIL/uL (ref 3.87–5.11)
RDW: 13.4 % (ref 11.5–15.5)
WBC: 5 10*3/uL (ref 4.0–10.5)
nRBC: 0 % (ref 0.0–0.2)

## 2023-01-19 LAB — KAPPA/LAMBDA LIGHT CHAINS
Kappa free light chain: 26.4 mg/L — ABNORMAL HIGH (ref 3.3–19.4)
Kappa, lambda light chain ratio: 5.39 — ABNORMAL HIGH (ref 0.26–1.65)
Lambda free light chains: 4.9 mg/L — ABNORMAL LOW (ref 5.7–26.3)

## 2023-01-22 ENCOUNTER — Ambulatory Visit: Payer: Medicare Other | Admitting: Podiatry

## 2023-01-23 LAB — MULTIPLE MYELOMA PANEL, SERUM
Albumin SerPl Elph-Mcnc: 3.8 g/dL (ref 2.9–4.4)
Albumin/Glob SerPl: 1.4 (ref 0.7–1.7)
Alpha 1: 0.2 g/dL (ref 0.0–0.4)
Alpha2 Glob SerPl Elph-Mcnc: 0.8 g/dL (ref 0.4–1.0)
B-Globulin SerPl Elph-Mcnc: 1 g/dL (ref 0.7–1.3)
Gamma Glob SerPl Elph-Mcnc: 0.9 g/dL (ref 0.4–1.8)
Globulin, Total: 2.9 g/dL (ref 2.2–3.9)
IgA: 43 mg/dL — ABNORMAL LOW (ref 87–352)
IgG (Immunoglobin G), Serum: 1023 mg/dL (ref 586–1602)
IgM (Immunoglobulin M), Srm: 31 mg/dL (ref 26–217)
M Protein SerPl Elph-Mcnc: 0.7 g/dL — ABNORMAL HIGH
Total Protein ELP: 6.7 g/dL (ref 6.0–8.5)

## 2023-01-24 ENCOUNTER — Other Ambulatory Visit: Payer: Self-pay

## 2023-01-24 MED ORDER — LOSARTAN POTASSIUM 25 MG PO TABS: 12.5000 mg | ORAL_TABLET | Freq: Two times a day (BID) | ORAL | 3 refills | Status: DC

## 2023-01-25 ENCOUNTER — Other Ambulatory Visit: Payer: Self-pay

## 2023-01-25 ENCOUNTER — Inpatient Hospital Stay (HOSPITAL_BASED_OUTPATIENT_CLINIC_OR_DEPARTMENT_OTHER): Payer: Medicare Other | Admitting: Hematology and Oncology

## 2023-01-25 ENCOUNTER — Encounter: Payer: Self-pay | Admitting: Hematology and Oncology

## 2023-01-25 VITALS — BP 132/59 | HR 76 | Temp 99.0°F | Resp 18 | Ht 64.5 in | Wt 172.2 lb

## 2023-01-25 DIAGNOSIS — D472 Monoclonal gammopathy: Secondary | ICD-10-CM

## 2023-01-25 NOTE — Assessment & Plan Note (Signed)
She has very mild hypercalcemia but I do not believe this is due to multiple myeloma I recommend increase hydration as tolerated  

## 2023-01-25 NOTE — Assessment & Plan Note (Signed)
Her blood work is stable with no signs of progression, renal failure, hypercalcemia or anemia Recent mild hypercalcemia could be a sign of slight dehydration Clinically, she has no signs of disease progression. I will see her on a yearly basis with history, physical examination, blood work to be done 1 week ahead of time prior to her next year return visit.

## 2023-01-25 NOTE — Progress Notes (Signed)
[facility] OFFICE PROGRESS NOTE  Patient Care Team: Thana Ates, MD as PCP - General (Internal Medicine) Lennette Bihari, MD as PCP - Cardiology (Cardiology) Sharrell Ku, MD as Consulting Physician (Gastroenterology) Romualdo Bolk, MD as Consulting Physician (Obstetrics and Gynecology)  ASSESSMENT & PLAN:  Monoclonal gammopathy of unknown significance (MGUS) Her blood work is stable with no signs of progression, renal failure, hypercalcemia or anemia Recent mild hypercalcemia could be a sign of slight dehydration Clinically, she has no signs of disease progression. I will see her on a yearly basis with history, physical examination, blood work to be done 1 week ahead of time prior to her next year return visit.  Hypercalcemia She has very mild hypercalcemia but I do not believe this is due to multiple myeloma I recommend increase hydration as tolerated   No orders of the defined types were placed in this encounter.   All questions were answered. The patient knows to call the clinic with any problems, questions or concerns. The total time spent in the appointment was 20 minutes encounter with patients including review of chart and various tests results, discussions about plan of care and coordination of care plan   Artis Delay, MD 01/25/2023 2:10 PM  INTERVAL HISTORY: Please see below for problem oriented charting. she returns for surveillance follow-up for history of MGUS She is doing well She denies recent atypical infection No recent bone pain  REVIEW OF SYSTEMS:   Constitutional: Denies fevers, chills or abnormal weight loss Eyes: Denies blurriness of vision Ears, nose, mouth, throat, and face: Denies mucositis or sore throat Respiratory: Denies cough, dyspnea or wheezes Cardiovascular: Denies palpitation, chest discomfort or lower extremity swelling Gastrointestinal:  Denies nausea, heartburn or change in bowel habits Skin: Denies abnormal  skin rashes Lymphatics: Denies new lymphadenopathy or easy bruising Neurological:Denies numbness, tingling or new weaknesses Behavioral/Psych: Mood is stable, no new changes  All other systems were reviewed with the patient and are negative.  I have reviewed the past medical history, past surgical history, social history and family history with the patient and they are unchanged from previous note.  ALLERGIES:  is allergic to other, penicillins, gold sodium thiosulfate, and nickel.  MEDICATIONS:  Current Outpatient Medications  Medication Sig Dispense Refill   aspirin EC 81 MG tablet Take 1 tablet (81 mg total) by mouth daily. Swallow whole. 90 tablet 3   carboxymethylcellulose (REFRESH PLUS) 0.5 % SOLN Place 1 drop into both eyes daily as needed (for dry eye).     cholecalciferol (VITAMIN D) 1000 UNITS tablet Take 1,000 Units by mouth daily.     COVID-19 mRNA vaccine 2023-2024 (COMIRNATY) syringe Inject into the muscle. (Patient not taking: Reported on 12/19/2022) 0.3 mL 0   cyanocobalamin (VITAMIN B12) 1000 MCG tablet Take 1,000 mcg by mouth every other day.     famotidine (PEPCID) 40 MG tablet Take 40 mg by mouth daily.     losartan (COZAAR) 25 MG tablet Take 0.5 tablets (12.5 mg total) by mouth in the morning and at bedtime. 90 tablet 3   metoprolol succinate (TOPROL-XL) 25 MG 24 hr tablet Take 1 tablet (25 mg total) by mouth daily. 90 tablet 3   metroNIDAZOLE (METROGEL) 0.75 % gel Apply 1 Application topically daily.     Multiple Vitamins-Minerals (WOMENS MULTIVITAMIN PLUS PO) Take 1 tablet by mouth daily.     Omega 3 1200 MG CAPS Take 2 capsules by mouth daily.     rosuvastatin (CRESTOR) 20 MG  tablet Take 1 tablet (20 mg total) by mouth daily. 90 tablet 3   tretinoin (RETIN-A) 0.025 % cream Apply 1 application  topically at bedtime.     UNABLE TO FIND Take 2 capsules by mouth at bedtime. Bone Up - calcium supplement     YUVAFEM 10 MCG TABS vaginal tablet PLACE 1 TABLET VAGINALLY 2  TIMES A WEEK. 24 tablet 0   No current facility-administered medications for this visit.    SUMMARY OF ONCOLOGIC HISTORY:  Erika Bolton was transferred to my care after her prior physician has left.  I reviewed the patient's records extensive and collaborated the history with the patient. Summary of her history is as follows: This patient was discovered to have MGUS after she was found to have abnormal blood tests. Blood work revealed IgG kappa She was observed.  PHYSICAL EXAMINATION: ECOG PERFORMANCE STATUS: 0 - Asymptomatic  Vitals:   01/25/23 1144  BP: (!) 132/59  Pulse: 76  Resp: 18  Temp: 99 F (37.2 C)  SpO2: 100%   Filed Weights   01/25/23 1144  Weight: 172 lb 3.2 oz (78.1 kg)    GENERAL:alert, no distress and comfortable NEURO: alert & oriented x 3 with fluent speech, no focal motor/sensory deficits  LABORATORY DATA:  I have reviewed the data as listed    Component Value Date/Time   NA 136 01/18/2023 0950   NA 140 01/09/2017 1523   K 4.3 01/18/2023 0950   K 4.1 01/09/2017 1523   CL 104 01/18/2023 0950   CL 109 (H) 01/10/2013 1446   CO2 28 01/18/2023 0950   CO2 23 01/09/2017 1523   GLUCOSE 107 (H) 01/18/2023 0950   GLUCOSE 94 01/09/2017 1523   GLUCOSE 116 (H) 01/10/2013 1446   BUN 14 01/18/2023 0950   BUN 14.1 01/09/2017 1523   CREATININE 0.73 01/18/2023 0950   CREATININE 0.59 01/14/2021 1512   CREATININE 0.7 01/09/2017 1523   CALCIUM 10.7 (H) 01/18/2023 0950   CALCIUM 10.5 (H) 01/09/2017 1523   PROT 6.8 01/18/2023 0950   PROT 7.1 12/04/2022 0918   PROT 7.5 01/09/2017 1523   ALBUMIN 4.1 01/18/2023 0950   ALBUMIN 4.6 12/04/2022 0918   ALBUMIN 4.0 01/09/2017 1523   AST 22 01/18/2023 0950   AST 39 01/14/2021 1512   AST 27 01/09/2017 1523   ALT 21 01/18/2023 0950   ALT 47 (H) 01/14/2021 1512   ALT 31 01/09/2017 1523   ALKPHOS 70 01/18/2023 0950   ALKPHOS 94 01/09/2017 1523   BILITOT 0.7 01/18/2023 0950   BILITOT 0.5 12/04/2022 0918   BILITOT  0.4 01/14/2021 1512   BILITOT 0.45 01/09/2017 1523   GFRNONAA >60 01/18/2023 0950   GFRNONAA >60 01/14/2021 1512   GFRAA >60 01/09/2020 1115    No results found for: "SPEP", "UPEP"  Lab Results  Component Value Date   WBC 5.0 01/18/2023   NEUTROABS 2.9 01/18/2023   HGB 13.7 01/18/2023   HCT 40.8 01/18/2023   MCV 90.7 01/18/2023   PLT 240 01/18/2023      Chemistry      Component Value Date/Time   NA 136 01/18/2023 0950   NA 140 01/09/2017 1523   K 4.3 01/18/2023 0950   K 4.1 01/09/2017 1523   CL 104 01/18/2023 0950   CL 109 (H) 01/10/2013 1446   CO2 28 01/18/2023 0950   CO2 23 01/09/2017 1523   BUN 14 01/18/2023 0950   BUN 14.1 01/09/2017 1523   CREATININE 0.73 01/18/2023 0950  CREATININE 0.59 01/14/2021 1512   CREATININE 0.7 01/09/2017 1523      Component Value Date/Time   CALCIUM 10.7 (H) 01/18/2023 0950   CALCIUM 10.5 (H) 01/09/2017 1523   ALKPHOS 70 01/18/2023 0950   ALKPHOS 94 01/09/2017 1523   AST 22 01/18/2023 0950   AST 39 01/14/2021 1512   AST 27 01/09/2017 1523   ALT 21 01/18/2023 0950   ALT 47 (H) 01/14/2021 1512   ALT 31 01/09/2017 1523   BILITOT 0.7 01/18/2023 0950   BILITOT 0.5 12/04/2022 0918   BILITOT 0.4 01/14/2021 1512   BILITOT 0.45 01/09/2017 1523

## 2023-02-05 ENCOUNTER — Ambulatory Visit: Payer: Medicare Other | Admitting: Podiatry

## 2023-02-26 ENCOUNTER — Ambulatory Visit: Payer: 59 | Admitting: Obstetrics and Gynecology

## 2023-02-27 ENCOUNTER — Ambulatory Visit: Payer: 59 | Admitting: Obstetrics and Gynecology

## 2023-02-28 NOTE — Progress Notes (Signed)
67 y.o. G39P2002 Married Caucasian female here for annual exam.    Prior patient of Dr. Oscar La.  Hx atypical endometrial cells on pap 02/22/22.  Her EMB under ultrasound guidance was not diagnostic. Her ECC showed benign endocervix.  Hx endometrial ablation limiting evaluation.   She started local vaginal estrogens and plan for repeat pap after consultation with pathologist, Dr. Luisa Hart.  Her follow up pap done 06/14/22 was normal.  Recommendation made for a pap follow up in one year.   She has been using Vagifem consistently since May.   Has small intramural fibroids.  Had a potential TIA 10/02/22.   She had right sided numbness.  She did go the hospital for evaluation. Has seen neurology in follow up.    She is also followed for osteopenia.   2 children and one 45 yo grandson.  Returned a little over 1 year ago.   PCP - Dr. Margaretann Loveless  Patient's last menstrual period was 08/01/2007 (approximate).           Sexually active: No.  The current method of family planning is post menopausal status.    Exercising: Yes.     s[employer] -yoga, waterfit Smoker:  no  Health Maintenance: Pap:  06/04/22 neg, 02/22/22 atrophic pattern with epithelial atypia, atypical endo cells, NOS: HR HPV neg History of abnormal Pap:  yes MMG:  08/28/22 Breast density Cat A, BI-RADS CAT 1 neg Colonoscopy:  04/10/18 BMD:   09/19/21  Result  osteopenia TDaP:  09/12/16 Gardasil:   no HIV: 10/02/22 NR Hep C: 08/03/15 NR Screening Labs:  PCP   reports that she has never smoked. She has never used smokeless tobacco. She reports current alcohol use of about 3.0 standard drinks of alcohol per week. She reports that she does not use drugs.  Past Medical History:  Diagnosis Date   Colon polyps 2003   Detached retina    Hypertension    Hypertensive retinopathy    Kidney stone 2016   Left bundle branch block 2012   Menorrhagia    MGUS (monoclonal gammopathy of unknown significance)    Plantar fasciitis    Precancerous  lesion 1987   mole excised from vulva   PVD (posterior vitreous detachment), right 09/07/2015   TIA (transient ischemic attack) 2024    Past Surgical History:  Procedure Laterality Date   ABLATION  11/10/2002   Hysteroscopic Thermal   CATARACT EXTRACTION     cyst on scalp     sebaccous(multiple times as a teen)   EYE SURGERY     gastro surgery  07/31/2010   LAPAROSCOPIC CHOLECYSTECTOMY  11/28/2001   left leg muscle surgery  07/31/2010   OTHER SURGICAL HISTORY     Sebaceous cyst on head removed 7x's   RETINAL DETACHMENT SURGERY      Current Outpatient Medications  Medication Sig Dispense Refill   aspirin EC 81 MG tablet Take 1 tablet (81 mg total) by mouth daily. Swallow whole. 90 tablet 3   carboxymethylcellulose (REFRESH PLUS) 0.5 % SOLN Place 1 drop into both eyes daily as needed (for dry eye).     cholecalciferol (VITAMIN D) 1000 UNITS tablet Take 1,000 Units by mouth daily.     COVID-19 mRNA vaccine 2023-2024 (COMIRNATY) syringe Inject into the muscle. 0.3 mL 0   cyanocobalamin (VITAMIN B12) 1000 MCG tablet Take 1,000 mcg by mouth every other day.     famotidine (PEPCID) 40 MG tablet Take 40 mg by mouth daily.     meloxicam (MOBIC)  15 MG tablet Take 1 tablet (15 mg total) by mouth daily. 60 tablet 1   metoprolol succinate (TOPROL-XL) 25 MG 24 hr tablet Take 1 tablet (25 mg total) by mouth daily. 90 tablet 3   metroNIDAZOLE (METROGEL) 0.75 % gel Apply 1 Application topically daily.     Multiple Vitamins-Minerals (WOMENS MULTIVITAMIN PLUS PO) Take 1 tablet by mouth daily.     Omega 3 1200 MG CAPS Take 2 capsules by mouth daily.     rosuvastatin (CRESTOR) 20 MG tablet Take 1 tablet (20 mg total) by mouth daily. 90 tablet 3   tretinoin (RETIN-A) 0.025 % cream Apply 1 application  topically at bedtime.     UNABLE TO FIND Take 2 capsules by mouth at bedtime. Bone Up - calcium supplement     YUVAFEM 10 MCG TABS vaginal tablet PLACE 1 TABLET VAGINALLY 2 TIMES A WEEK. 24 tablet 0    losartan (COZAAR) 25 MG tablet Take 0.5 tablets (12.5 mg total) by mouth in the morning and at bedtime. 90 tablet 3   No current facility-administered medications for this visit.    Family History  Problem Relation Age of Onset   Colon cancer Father 47   Alzheimer's disease Father    Emphysema Father    COPD Father    Heart attack Mother    Cancer Brother        peritod gland cancer   Breast cancer Maternal Aunt        post menopause    Review of Systems  All other systems reviewed and are negative.   Exam:   Ht 5' 4.5" (1.638 m)   Wt 170 lb (77.1 kg)   LMP 08/01/2007 (Approximate)   BMI 28.73 kg/m     General appearance: alert, cooperative and appears stated age Head: normocephalic, without obvious abnormality, atraumatic Neck: no adenopathy, supple, symmetrical, trachea midline and thyroid normal to inspection and palpation Lungs: clear to auscultation bilaterally Breasts: normal appearance, no masses or tenderness, No nipple retraction or dimpling, No nipple discharge or bleeding, No axillary adenopathy Heart: regular rate and rhythm Abdomen: soft, non-tender; no masses, no organomegaly Extremities: extremities normal, atraumatic, no cyanosis or edema Skin: skin color, texture, turgor normal. No rashes or lesions Lymph nodes: cervical, supraclavicular, and axillary nodes normal. Neurologic: grossly normal  Pelvic: External genitalia:  no lesions              No abnormal inguinal nodes palpated.              Urethra:  normal appearing urethra with no masses, tenderness or lesions              Bartholins and Skenes: normal                 Vagina: normal appearing vagina with normal color and discharge, no lesions              Cervix:polyp present.  Verbal consent for removal. Ring forceps used to remove tissue and send to pathology.  No complications. Minimal EBL.              Pap taken: yes Bimanual Exam:  Uterus:  normal size, contour, position, consistency,  mobility, non-tender              Adnexa: no mass, fullness, tenderness              Rectal exam: yes.  Confirms.  Anus:  normal sphincter tone, no lesions  Chaperone was present for exam:  Darcus Austin, CMA  Assessment:   Encounter for breast and pelvic exam.  Hx atypical endometrial cells on pap.  EMB nondiagnostic. ECC benign.  Follow up pap normal.  Hx use of Vagifem for 3 months in preparation for pap.  Cervical cancer screening.  Cervical polyp removal today. Hx possible TIA prior to initiating vaginal estrogen use. Osteopenia.   Plan: Mammogram screening discussed. Self breast awareness reviewed. Pap collected. Polyp sent to pathology.  Guidelines for Calcium, Vitamin D, regular exercise program including cardiovascular and weight bearing exercise. She will stop her vaginal estrogen after results from pap return.   Rx given so she can restart 6 - 12 weeks prior to next pap.  Bone density 09/2023 at [facility]. Follow up annually and prn.   35 min  total time was spent for this patient encounter, including preparation, face-to-face counseling with the patient, coordination of care, and documentation of the encounter in addition to doing breast and pelvic exam and pap.

## 2023-03-05 ENCOUNTER — Ambulatory Visit (INDEPENDENT_AMBULATORY_CARE_PROVIDER_SITE_OTHER): Payer: Medicare Other | Admitting: Podiatry

## 2023-03-05 DIAGNOSIS — M722 Plantar fascial fibromatosis: Secondary | ICD-10-CM | POA: Diagnosis not present

## 2023-03-05 MED ORDER — MELOXICAM 15 MG PO TABS: 15.0000 mg | ORAL_TABLET | Freq: Every day | ORAL | 1 refills | Status: DC

## 2023-03-05 MED ORDER — BETAMETHASONE SOD PHOS & ACET 6 (3-3) MG/ML IJ SUSP
3.0000 mg | Freq: Once | INTRAMUSCULAR | Status: AC
  Administered 2023-03-05: 3 mg via INTRA_ARTICULAR

## 2023-03-05 NOTE — Progress Notes (Signed)
Chief Complaint  Patient presents with   Plantar Fasciitis    Pt stated that she has good days and bad ones     Subjective: 67 y.o. female presenting today for follow-up evaluation of plantar fasciitis bilateral.  Initially it was just the left heel that was hurting but now both heels are burning.  Idiopathic onset.  Patient does have a history of plantar fasciitis 10 years ago.  PSxHx Beverlyn Roux gastroc aponeurosis lengthening LLE to address her plantar fasciitis about 10-15 years ago   Past Medical History:  Diagnosis Date   Colon polyps 2003   Detached retina    Hypertension    Hypertensive retinopathy    Kidney stone 2016   Left bundle branch block 2012   Menorrhagia    MGUS (monoclonal gammopathy of unknown significance)    Plantar fasciitis    Precancerous lesion 1987   mole excised from vulva   PVD (posterior vitreous detachment), right 09/07/2015   TIA (transient ischemic attack) 2024   Past Surgical History:  Procedure Laterality Date   ABLATION  11/10/2002   Hysteroscopic Thermal   CATARACT EXTRACTION     cyst on scalp     sebaccous(multiple times as a teen)   EYE SURGERY     gastro surgery  07/31/2010   LAPAROSCOPIC CHOLECYSTECTOMY  11/28/2001   left leg muscle surgery  07/31/2010   OTHER SURGICAL HISTORY     Sebaceous cyst on head removed 7x's   RETINAL DETACHMENT SURGERY     Allergies  Allergen Reactions   Other Other (See Comments)    Potassium dichromate, broke out in dry rashes   Penicillins Swelling    Throat swelling - hasn't had since was a teen   Gold Sodium Thiosulfate Rash   Nickel Rash     Objective: Physical Exam General: The patient is alert and oriented x3 in no acute distress.  Dermatology: Skin is warm, dry and supple bilateral lower extremities. Negative for open lesions or macerations bilateral.   Vascular: Dorsalis Pedis and Posterior Tibial pulses palpable bilateral.  Capillary fill time is immediate to all  digits.  Neurological: Grossly intact via light touch  Musculoskeletal: Tenderness to palpation to the plantar aspect of the bilateral heel along the plantar fascia. All other joints range of motion within normal limits bilateral. Strength 5/5 in all groups bilateral.   Radiographic exam LT foot 12/18/2022: Normal osseous mineralization. Joint spaces preserved. No fracture/dislocation/boney destruction. No other soft tissue abnormalities or radiopaque foreign bodies.  Os peroneum noted  Assessment: 1. Plantar fasciitis bilateral 2.  History of endoscopic plantar fasciotomy left x 10 years ago  Plan of Care:  -Patient evaluated.    -Injection of 0.5cc Celestone soluspan injected into the bilateral plantar fascia.  -Patient was prescribed meloxicam 7.5 mg daily for 30 days.  She has completed the prescription.  This was sent by another provider in our office -Prescription for meloxicam 15 mg daily -Continue Brooks running shoes -Continue physical therapy -Return to clinic 6 weeks   Felecia Shelling, DPM [facility]  Dr. Felecia Shelling, DPM    [address], [place] [address]                Office [phone]  Fax (  [phone]     

## 2023-03-14 ENCOUNTER — Encounter: Payer: Self-pay | Admitting: Obstetrics and Gynecology

## 2023-03-14 ENCOUNTER — Ambulatory Visit: Payer: Medicare Other | Admitting: Obstetrics and Gynecology

## 2023-03-14 ENCOUNTER — Other Ambulatory Visit (HOSPITAL_COMMUNITY)
Admission: RE | Admit: 2023-03-14 | Discharge: 2023-03-14 | Disposition: A | Discharge status: Home / Self Care | Payer: Medicare Other | Source: Ambulatory Visit | Attending: Obstetrics and Gynecology | Admitting: Obstetrics and Gynecology

## 2023-03-14 VITALS — BP 128/76 | HR 79 | Ht 64.5 in | Wt 170.0 lb

## 2023-03-14 DIAGNOSIS — Z8742 Personal history of other diseases of the female genital tract: Secondary | ICD-10-CM | POA: Insufficient documentation

## 2023-03-14 DIAGNOSIS — M8589 Other specified disorders of bone density and structure, multiple sites: Secondary | ICD-10-CM

## 2023-03-14 DIAGNOSIS — Z9189 Other specified personal risk factors, not elsewhere classified: Secondary | ICD-10-CM | POA: Diagnosis not present

## 2023-03-14 DIAGNOSIS — Z01419 Encounter for gynecological examination (general) (routine) without abnormal findings: Secondary | ICD-10-CM

## 2023-03-14 DIAGNOSIS — Z124 Encounter for screening for malignant neoplasm of cervix: Secondary | ICD-10-CM

## 2023-03-14 DIAGNOSIS — N841 Polyp of cervix uteri: Secondary | ICD-10-CM

## 2023-03-14 MED ORDER — ESTRADIOL 10 MCG VA TABS: 10.0000 ug | ORAL_TABLET | VAGINAL | 0 refills | Status: DC

## 2023-03-14 NOTE — Patient Instructions (Signed)

## 2023-03-15 ENCOUNTER — Telehealth: Payer: Self-pay

## 2023-03-15 NOTE — Telephone Encounter (Signed)
Pt LVM stating that she has scheduled DEXA w/ [facility] for 09/10/2023 and is requesting order be faxed over to them.   AEX just yesterday. Order filled out and placed on Dr. Rica Records desk for authorization.

## 2023-03-16 LAB — SURGICAL PATHOLOGY

## 2023-03-16 NOTE — Telephone Encounter (Signed)
Order signed and faxed by [facility] on 03/15/2023.   Pt notified and voiced understanding. Encounter closed.

## 2023-03-19 LAB — CYTOLOGY - PAP
Diagnosis: NEGATIVE
Diagnosis: REACTIVE

## 2023-03-20 ENCOUNTER — Encounter: Payer: Self-pay | Admitting: Obstetrics and Gynecology

## 2023-04-18 ENCOUNTER — Ambulatory Visit (INDEPENDENT_AMBULATORY_CARE_PROVIDER_SITE_OTHER): Payer: Medicare Other | Admitting: Podiatry

## 2023-04-18 DIAGNOSIS — M722 Plantar fascial fibromatosis: Secondary | ICD-10-CM

## 2023-04-18 MED ORDER — BETAMETHASONE SOD PHOS & ACET 6 (3-3) MG/ML IJ SUSP
3.0000 mg | Freq: Once | INTRAMUSCULAR | Status: AC
  Administered 2023-04-18: 3 mg via INTRA_ARTICULAR

## 2023-04-18 NOTE — Progress Notes (Signed)
No chief complaint on file.   Subjective: 67 y.o. female presenting today for follow-up evaluation of plantar fasciitis bilateral.  Overall the patient continues to notice some improvement.  She says that her feet are not hurting near as much as they were but she continues to have some slight tenderness.  She continues to go to physical therapy.  Presenting for further treatment evaluation  PSxHx Baumann gastroc aponeurosis lengthening LLE to address her plantar fasciitis about 10-15 years ago   Past Medical History:  Diagnosis Date   Colon polyps 2003   Detached retina    Hypertension    Hypertensive retinopathy    Kidney stone 2016   Left bundle branch block 2012   Menorrhagia    MGUS (monoclonal gammopathy of unknown significance)    Plantar fasciitis    Precancerous lesion 1987   mole excised from vulva   PVD (posterior vitreous detachment), right 09/07/2015   TIA (transient ischemic attack) 2024   Past Surgical History:  Procedure Laterality Date   ABLATION  11/10/2002   Hysteroscopic Thermal   CATARACT EXTRACTION     cyst on scalp     sebaccous(multiple times as a teen)   EYE SURGERY     gastro surgery  07/31/2010   LAPAROSCOPIC CHOLECYSTECTOMY  11/28/2001   left leg muscle surgery  07/31/2010   OTHER SURGICAL HISTORY     Sebaceous cyst on head removed 7x's   RETINAL DETACHMENT SURGERY     Allergies  Allergen Reactions   Other Other (See Comments)    Potassium dichromate, broke out in dry rashes   Penicillins Swelling    Throat swelling - hasn't had since was a teen   Gold Sodium Thiosulfate Rash   Nickel Rash     Objective: Physical Exam General: The patient is alert and oriented x3 in no acute distress.  Dermatology: Skin is warm, dry and supple bilateral lower extremities. Negative for open lesions or macerations bilateral.   Vascular: Dorsalis Pedis and Posterior Tibial pulses palpable bilateral.  Capillary fill time is immediate to all  digits.  Neurological: Grossly intact via light touch  Musculoskeletal: There continues to be some tenderness to palpation to the plantar aspect of the bilateral heel along the plantar fascia. All other joints range of motion within normal limits bilateral. Strength 5/5 in all groups bilateral.   Radiographic exam LT foot 12/18/2022: Normal osseous mineralization. Joint spaces preserved. No fracture/dislocation/boney destruction. No other soft tissue abnormalities or radiopaque foreign bodies.  Os peroneum noted  Assessment: 1. Plantar fasciitis bilateral 2.  History of endoscopic plantar fasciotomy left x 10 years ago  Plan of Care:  -Patient evaluated.    -Injection of 0.5cc Celestone soluspan injected into the bilateral plantar fascia.  - Continue meloxicam 15 mg daily -Appointment with orthotics department for custom orthotics to support the medial longitudinal arch of the foot and alleviate pressure from the heel/plantar fascia -Continue physical therapy at [facility] -Return to clinic afterward in her custom orthotics for about 6 weeks  Felecia Shelling, DPM [facility]  Dr. Felecia Shelling, DPM    [address] [place] [address]                Office [phone]  Fax [phone]

## 2023-05-02 ENCOUNTER — Ambulatory Visit (INDEPENDENT_AMBULATORY_CARE_PROVIDER_SITE_OTHER): Payer: Medicare Other | Admitting: Neurology

## 2023-05-02 ENCOUNTER — Encounter: Payer: Self-pay | Admitting: Neurology

## 2023-05-02 VITALS — BP 117/71 | HR 76 | Ht 64.5 in | Wt 173.0 lb

## 2023-05-02 DIAGNOSIS — R9082 White matter disease, unspecified: Secondary | ICD-10-CM | POA: Diagnosis not present

## 2023-05-02 DIAGNOSIS — G459 Transient cerebral ischemic attack, unspecified: Secondary | ICD-10-CM | POA: Diagnosis not present

## 2023-05-02 DIAGNOSIS — R269 Unspecified abnormalities of gait and mobility: Secondary | ICD-10-CM

## 2023-05-02 DIAGNOSIS — R200 Anesthesia of skin: Secondary | ICD-10-CM

## 2023-05-02 DIAGNOSIS — M4802 Spinal stenosis, cervical region: Secondary | ICD-10-CM | POA: Diagnosis not present

## 2023-05-02 NOTE — Progress Notes (Signed)
[facility]  PATIENT: Erika Bolton DOB: 02-12-1956  REFERRING DOCTOR OR PCP: Kirby Funk, MD; Eliane Decree, PA-C SOURCE: patient, notes from primary care, imaging and lab reports, MRI images personally reviewed.  _________________________________   HISTORICAL  CHIEF COMPLAINT:  Chief Complaint  Patient presents with   Follow-up    Pt in room 11. Here TIA follow up. Pt reports doing well, wants to discuss heart related issues with post stroke.     HISTORY OF PRESENT ILLNESS:  Erika Bolton is a 67 y.o. woman with numbness.  Update 05/02/2023 Her right sided numbness resolved and has not returned.  They lasted about 12-24 hours.     She was discharged and symptoms recurred (shorter duration though)the following day so she re-presented to the ED.     MRI 10/01/2022 showed no acute findings and CTA showed normal large vessels.      She denies any stress or anxiety at the time.     She saw Dr. Roda Shutters who recommended that she be on both ASA and Plavix x 3 weeks and ten just ASA 81 mg.    She had an ECHO showing EF% only 35%.   CT chest showed normal heart size and other structures.   She saw cardiology and they repeated Echo in May 2024 and EF% was 50-55%.  She has grade 1 diastolic dysfunction.  She is on ASA 81 mg (was on Plavix)  I had seen her in 2023 for leg numbness.   She was found to have moderate cervical spine spinal stenosis but no myelopathic signal.      Her gait is fine and she notes no balance issues.   She denies neck pain but has stiffness in her neck and across her lower back.   She denies bladder changes.     She is exercising some and is just starting Tai Chi (at [employer] on [address])   She also has obstructive sleep apnea treated with an oral appliance.   Weight is slightly (5 lbs) increased since ear;y 2024.   Images reviewed:  MRi brain 10/02/2022 showed remote small left cerebellar infarction and mild chronic microvascular ischemic  change.    MRI cervical spine 10/02/2022 shows normal spinal cord and moderate spinal stenosis at C5-C6 due to disc protrusion and disc protrusion causing mild to moderate spinal stenosis at C6C7  MRI lumbar spine 09/27/2021.  Minimal DDD at L4-L5 mild facet hypertrophy at L4-L5 and L5-S1.  There is no spinal stenosis or nerve root compression.  MRI brain 05/12/2007 showed some small T2/FLAIR hyperintense foci in hemispheres most c/w chronic microvascular ischemic change.    REVIEW OF SYSTEMS: Constitutional: No fevers, chills, sweats, or change in appetite Eyes: No visual changes, double vision, eye pain Ear, nose and throat: No hearing loss, ear pain, nasal congestion, sore throat Cardiovascular: No chest pain, palpitations Respiratory:  No shortness of breath at rest or with exertion.   No wheezes GastrointestinaI: No nausea, vomiting, diarrhea, abdominal pain, fecal incontinence Genitourinary:  No dysuria, urinary retention or frequency.  No nocturia. Musculoskeletal:  No neck pain, back pain Integumentary: No rash, pruritus, skin lesions Neurological: as above Psychiatric: No depression at this time.  No anxiety Endocrine: No palpitations, diaphoresis, change in appetite, change in weigh or increased thirst Hematologic/Lymphatic:  No anemia, purpura, petechiae. Allergic/Immunologic: No itchy/runny eyes, nasal congestion, recent allergic reactions, rashes  ALLERGIES: Allergies  Allergen Reactions   Other Other (See Comments)    Potassium dichromate,  broke out in dry rashes   Penicillins Swelling    Throat swelling - hasn't had since was a teen   Gold Sodium Thiosulfate Rash   Nickel Rash    HOME MEDICATIONS:  Current Outpatient Medications:    alendronate (FOSAMAX) 70 MG tablet, Take 70 mg by mouth once a week. Take with a full glass of water on an empty stomach., Disp: , Rfl:    aspirin EC 81 MG tablet, Take 1 tablet (81 mg total) by mouth daily. Swallow whole., Disp: 90  tablet, Rfl: 3   carboxymethylcellulose (REFRESH PLUS) 0.5 % SOLN, Place 1 drop into both eyes daily as needed (for dry eye)., Disp: , Rfl:    cholecalciferol (VITAMIN D) 1000 UNITS tablet, Take 2,000 Units by mouth daily., Disp: , Rfl:    COVID-19 mRNA vaccine 2023-2024 (COMIRNATY) syringe, Inject into the muscle., Disp: 0.3 mL, Rfl: 0   cyanocobalamin (VITAMIN B12) 1000 MCG tablet, Take 1,000 mcg by mouth every other day., Disp: , Rfl:    Estradiol (YUVAFEM) 10 MCG TABS vaginal tablet, Place 1 tablet (10 mcg total) vaginally 2 (two) times a week., Disp: 24 tablet, Rfl: 0   famotidine (PEPCID) 40 MG tablet, Take 40 mg by mouth daily., Disp: , Rfl:    meloxicam (MOBIC) 15 MG tablet, Take 1 tablet (15 mg total) by mouth daily., Disp: 60 tablet, Rfl: 1   metoprolol succinate (TOPROL-XL) 25 MG 24 hr tablet, Take 1 tablet (25 mg total) by mouth daily., Disp: 90 tablet, Rfl: 3   metroNIDAZOLE (METROGEL) 0.75 % gel, Apply 1 Application topically daily., Disp: , Rfl:    Multiple Vitamins-Minerals (WOMENS MULTIVITAMIN PLUS PO), Take 1 tablet by mouth daily., Disp: , Rfl:    Omega 3 1200 MG CAPS, Take 2 capsules by mouth daily., Disp: , Rfl:    rosuvastatin (CRESTOR) 20 MG tablet, Take 1 tablet (20 mg total) by mouth daily., Disp: 90 tablet, Rfl: 3   tretinoin (RETIN-A) 0.025 % cream, Apply 1 application  topically at bedtime., Disp: , Rfl:    UNABLE TO FIND, Take 2 capsules by mouth at bedtime. Bone Up - calcium supplement, Disp: , Rfl:    losartan (COZAAR) 25 MG tablet, Take 0.5 tablets (12.5 mg total) by mouth in the morning and at bedtime., Disp: 90 tablet, Rfl: 3  PAST MEDICAL HISTORY: Past Medical History:  Diagnosis Date   Colon polyps 2003   Detached retina    Hypertension    Hypertensive retinopathy    Kidney stone 2016   Left bundle branch block 2012   Menorrhagia    MGUS (monoclonal gammopathy of unknown significance)    Plantar fasciitis    Precancerous lesion 1987   mole excised  from vulva   PVD (posterior vitreous detachment), right 09/07/2015   TIA (transient ischemic attack) 2024    PAST SURGICAL HISTORY: Past Surgical History:  Procedure Laterality Date   ABLATION  11/10/2002   Hysteroscopic Thermal   CATARACT EXTRACTION     cyst on scalp     sebaccous(multiple times as a teen)   EYE SURGERY     gastro surgery  07/31/2010   LAPAROSCOPIC CHOLECYSTECTOMY  11/28/2001   left leg muscle surgery  07/31/2010   OTHER SURGICAL HISTORY     Sebaceous cyst on head removed 7x's   RETINAL DETACHMENT SURGERY      FAMILY HISTORY: Family History  Problem Relation Age of Onset   Colon cancer Father 68   Alzheimer's disease Father  Emphysema Father    COPD Father    Heart attack Mother    Cancer Brother        peritod gland cancer   Breast cancer Maternal Aunt        post menopause    SOCIAL HISTORY:  Social History   Socioeconomic History   Marital status: Married    Spouse name: Not on file   Number of children: 2   Years of education: M.ED.   Highest education level: Not on file  Occupational History    Comment: [employer]  Tobacco Use   Smoking status: Never   Smokeless tobacco: Never  Vaping Use   Vaping status: Never Used  Substance and Sexual Activity   Alcohol use: Yes    Alcohol/week: 3.0 standard drinks of alcohol    Types: 3 Glasses of wine per week   Drug use: No   Sexual activity: Not Currently    Partners: Male    Birth control/protection: Post-menopausal    Comment: has had abnormal pap in the 2023  Other Topics Concern   Not on file  Social History Narrative   2 cups of coffee a day    Social Determinants of Health   Financial Resource Strain: Not on file  Food Insecurity: Not on file  Transportation Needs: Not on file  Physical Activity: Not on file  Stress: Not on file  Social Connections: Not on file  Intimate Partner Violence: Not on file     PHYSICAL EXAM  Vitals:   05/02/23 1420  BP: 117/71   Pulse: 76  Weight: 173 lb (78.5 kg)  Height: 5' 4.5" (1.638 m)     Body mass index is 29.24 kg/m.   General: The patient is well-developed and well-nourished and in no acute distress  HEENT:  Head is [place]/AT.  Sclera are anicteric.    Neck: No carotid bruits are noted.  The neck is nontender.  Skin: Extremities are without rash or  edema.  Musculoskeletal:  Back is nontender  Neurologic Exam  Mental status: The patient is alert and oriented x 3 at the time of the examination. The patient has apparent normal recent and remote memory, with an apparently normal attention span and concentration ability.   Speech is normal.  Cranial nerves: Extraocular movements are full.  There is good facial sensation to soft touch bilaterally.Facial strength is normal.  Trapezius and sternocleidomastoid strength is normal. No dysarthria is noted.   Normal hearing  Motor:  Muscle bulk is normal.   Tone is normal. Strength is  5 / 5 in all 4 extremities.   Sensory: Sensory testing is intact to pinprick, soft touch and vibration sensation in all 4 extremities.  Coordination: Cerebellar testing reveals good finger-nose-finger and heel-to-shin bilaterally.  Gait and station: Station is normal.   Gait is normal.  Tandem gait is normal for age(minimally wide).  Romberg is negative.   Reflexes: Deep tendon reflexes are symmetric and normal bilaterally in arms.   She has increased DTR (3) at knees.  No ankle clonus today    DIAGNOSTIC DATA (LABS, IMAGING, TESTING) - I reviewed patient records, labs, notes, testing and imaging myself where available.  Lab Results  Component Value Date   WBC 5.0 01/18/2023   HGB 13.7 01/18/2023   HCT 40.8 01/18/2023   MCV 90.7 01/18/2023   PLT 240 01/18/2023      Component Value Date/Time   NA 136 01/18/2023 0950   NA 140 01/09/2017 1523  K 4.3 01/18/2023 0950   K 4.1 01/09/2017 1523   CL 104 01/18/2023 0950   CL 109 (H) 01/10/2013 1446   CO2 28  01/18/2023 0950   CO2 23 01/09/2017 1523   GLUCOSE 107 (H) 01/18/2023 0950   GLUCOSE 94 01/09/2017 1523   GLUCOSE 116 (H) 01/10/2013 1446   BUN 14 01/18/2023 0950   BUN 14.1 01/09/2017 1523   CREATININE 0.73 01/18/2023 0950   CREATININE 0.59 01/14/2021 1512   CREATININE 0.7 01/09/2017 1523   CALCIUM 10.7 (H) 01/18/2023 0950   CALCIUM 10.5 (H) 01/09/2017 1523   PROT 6.8 01/18/2023 0950   PROT 7.1 12/04/2022 0918   PROT 7.5 01/09/2017 1523   ALBUMIN 4.1 01/18/2023 0950   ALBUMIN 4.6 12/04/2022 0918   ALBUMIN 4.0 01/09/2017 1523   AST 22 01/18/2023 0950   AST 39 01/14/2021 1512   AST 27 01/09/2017 1523   ALT 21 01/18/2023 0950   ALT 47 (H) 01/14/2021 1512   ALT 31 01/09/2017 1523   ALKPHOS 70 01/18/2023 0950   ALKPHOS 94 01/09/2017 1523   BILITOT 0.7 01/18/2023 0950   BILITOT 0.5 12/04/2022 0918   BILITOT 0.4 01/14/2021 1512   BILITOT 0.45 01/09/2017 1523   GFRNONAA >60 01/18/2023 0950   GFRNONAA >60 01/14/2021 1512   GFRAA >60 01/09/2020 1115        ASSESSMENT AND PLAN  White matter abnormality on MRI of brain  Gait disturbance  TIA (transient ischemic attack)  Cervical spinal stenosis  Numbness   Continue ASA 81 mg daily. Can do rosuvastatin 20 mg for elevated LDL and vascular risks. Stay active and exercise.  Try to lose weight Use oral appliance for OSA We discussed her spinal stenosis and to let us know if leg numbmess, gait change occurs.  She has seen Dr. Yetta Barre (neurosurg) in past.   return as needed if there are new or worsening neurologic symptoms.   Hedy Garro A. Epimenio Foot, MD, [facility] 05/02/2023, 2:44 PM Certified in Neurology, Clinical Neurophysiology, Sleep Medicine and Neuroimaging  [facility] [address] [place], [place] [address] [phone]

## 2023-05-03 ENCOUNTER — Ambulatory Visit: Payer: Medicare Other

## 2023-05-03 NOTE — Progress Notes (Signed)
Patient will return with old orthotics to rule out options for new ones

## 2023-05-14 ENCOUNTER — Telehealth: Payer: Self-pay | Admitting: *Deleted

## 2023-05-14 NOTE — Telephone Encounter (Signed)
Pre-operative Risk Assessment    Patient Name: Erika Bolton  DOB: 03/06/1956 MRN: [id number]  DATE OF LAST VISIT: 12/19/22 DR. KELLY DATE OF NEXT VISIT: 06/19/23 DR. KELLY 6 MONTH F/U    Request for Surgical Clearance    Procedure:   COLONOSCOPY ; H/O ADENOMATOUS POLYPS  Date of Surgery:  Clearance TBD                                 Surgeon:  [facility]. [facility] Surgeon's Group or Practice Name:  [facility] Phone number:  [phone] Fax number:  [phone]   Type of Clearance Requested:   - Medical ; NO MEDICATIONS LISTED AS NEEDING TO BE HELD   Type of Anesthesia:   PROPOFOL   Additional requests/questions:    Elpidio Anis   05/14/2023, 3:50 PM

## 2023-05-14 NOTE — Telephone Encounter (Signed)
Spoke with patient and she is okay with rescheduling her appointment to 10/23 at 1:55 pm with Azalee Course, PA-C. Patient thanked me for the call.

## 2023-05-14 NOTE — Telephone Encounter (Signed)
If she plans to have procedure prior to 11/19, we will have to schedule a virtual visit.  Primary Cardiologist:Thomas Tresa Endo, MD   Preoperative team, please contact this patient and set up a phone call appointment for further preoperative risk assessment. Please obtain consent and complete medication review. Thank you for your help.   I confirm that guidance regarding antiplatelet and oral anticoagulation therapy has been completed and, if necessary, noted below (asa is listed but no cardiac indication).   I also confirmed the patient resides in the state of [place]. As per [facility] Medical Board telemedicine laws, the patient must reside in the state in which the provider is licensed.    Levi Aland, NP-C  05/14/2023, 4:35 PM [address], [address] Office [phone] Fax [phone]

## 2023-05-15 ENCOUNTER — Telehealth: Payer: Self-pay | Admitting: Cardiovascular Disease

## 2023-05-15 NOTE — Telephone Encounter (Signed)
Patient calling back to schedule with dr Tresa Endo and what to speak to the nurse to make sure its okay for her to have tboth appts. Please advise

## 2023-05-15 NOTE — Telephone Encounter (Signed)
Patient called because she states yesterday she got a call about her clearance form.   She was informed she needed a sooner appointment if she wanted clearance before November. Her appointment with Dr. Tresa Endo was cancelled and she was scheduled with [facility]. She states after she thought about she realized she was not comfortable with that and she cancelled her appointment with Lisabeth Devoid and rescheduled with [facility] for clearance. Did advise if she is going to do the clearance/follow up with Irving Burton she will not need appointment with [facility] in November.   She wanted to keep appointment with Dr. Tresa Endo until she saw [facility].

## 2023-05-18 ENCOUNTER — Other Ambulatory Visit: Payer: Self-pay

## 2023-05-18 DIAGNOSIS — E785 Hyperlipidemia, unspecified: Secondary | ICD-10-CM

## 2023-05-18 DIAGNOSIS — Z79899 Other long term (current) drug therapy: Secondary | ICD-10-CM

## 2023-05-19 LAB — COMPREHENSIVE METABOLIC PANEL
ALT: 21 [IU]/L (ref 0–32)
AST: 19 [IU]/L (ref 0–40)
Albumin: 4.6 g/dL (ref 3.9–4.9)
Alkaline Phosphatase: 77 [IU]/L (ref 44–121)
BUN/Creatinine Ratio: 26 (ref 12–28)
BUN: 18 mg/dL (ref 8–27)
Bilirubin Total: 0.5 mg/dL (ref 0.0–1.2)
CO2: 24 mmol/L (ref 20–29)
Calcium: 10.6 mg/dL — ABNORMAL HIGH (ref 8.7–10.3)
Chloride: 106 mmol/L (ref 96–106)
Creatinine, Ser: 0.68 mg/dL (ref 0.57–1.00)
Globulin, Total: 2.2 g/dL (ref 1.5–4.5)
Glucose: 104 mg/dL — ABNORMAL HIGH (ref 70–99)
Potassium: 4.7 mmol/L (ref 3.5–5.2)
Sodium: 142 mmol/L (ref 134–144)
Total Protein: 6.8 g/dL (ref 6.0–8.5)
eGFR: 97 mL/min/{1.73_m2} (ref >59)

## 2023-05-19 LAB — LIPID PANEL
Chol/HDL Ratio: 2 {ratio} (ref 0.0–4.4)
Cholesterol, Total: 131 mg/dL (ref 100–199)
HDL: 64 mg/dL (ref >39)
LDL Chol Calc (NIH): 49 mg/dL (ref 0–99)
Triglycerides: 96 mg/dL (ref 0–149)
VLDL Cholesterol Cal: 18 mg/dL (ref 5–40)

## 2023-05-22 ENCOUNTER — Other Ambulatory Visit (HOSPITAL_BASED_OUTPATIENT_CLINIC_OR_DEPARTMENT_OTHER): Payer: Self-pay

## 2023-05-22 MED ORDER — COVID-19 MRNA VAC-TRIS(PFIZER) 30 MCG/0.3ML IM SUSY
0.3000 mL | PREFILLED_SYRINGE | Freq: Once | INTRAMUSCULAR | 0 refills | Status: AC
  Filled 2023-05-22: qty 0.3, 1d supply, fill #0

## 2023-05-23 ENCOUNTER — Ambulatory Visit: Payer: Medicare Other | Admitting: Physician Assistant

## 2023-05-23 ENCOUNTER — Encounter: Payer: Self-pay | Admitting: Nurse Practitioner

## 2023-05-23 ENCOUNTER — Ambulatory Visit: Payer: Medicare Other | Attending: Cardiovascular Disease | Admitting: Nurse Practitioner

## 2023-05-23 VITALS — BP 138/72 | HR 74 | Ht 64.0 in | Wt 172.0 lb

## 2023-05-23 DIAGNOSIS — I428 Other cardiomyopathies: Secondary | ICD-10-CM

## 2023-05-23 DIAGNOSIS — I447 Left bundle-branch block, unspecified: Secondary | ICD-10-CM | POA: Diagnosis not present

## 2023-05-23 DIAGNOSIS — I10 Essential (primary) hypertension: Secondary | ICD-10-CM

## 2023-05-23 DIAGNOSIS — E785 Hyperlipidemia, unspecified: Secondary | ICD-10-CM | POA: Diagnosis not present

## 2023-05-23 DIAGNOSIS — Z01810 Encounter for preprocedural cardiovascular examination: Secondary | ICD-10-CM

## 2023-05-23 DIAGNOSIS — Z8673 Personal history of transient ischemic attack (TIA), and cerebral infarction without residual deficits: Secondary | ICD-10-CM

## 2023-05-23 NOTE — Patient Instructions (Signed)
Medication Instructions:  Your physician recommends that you continue on your current medications as directed. Please refer to the Current Medication list given to you today.  *If you need a refill on your cardiac medications before your next appointment, please call your pharmacy*   Lab Work: NONE ordered at this time of appointment    Testing/Procedures: NONE ordered at this time of appointment     Follow-Up: At [facility], you and your health needs are our priority.  As part of our continuing mission to provide you with exceptional heart care, we have created designated Provider Care Teams.  These Care Teams include your primary Cardiologist (physician) and Advanced Practice Providers (APPs -  Physician Assistants and Nurse Practitioners) who all work together to provide you with the care you need, when you need it.  We recommend signing up for the patient portal called "MyChart".  Sign up information is provided on this After Visit Summary.  MyChart is used to connect with patients for Virtual Visits (Telemedicine).  Patients are able to view lab/test results, encounter notes, upcoming appointments, etc.  Non-urgent messages can be sent to your provider as well.   To learn more about what you can do with MyChart, go to [email or web address].    Your next appointment:   6 month(s)  Provider:   Thomas Kelly, MD     Other Instructions  

## 2023-05-23 NOTE — Progress Notes (Signed)
Office Visit    Patient Name: Erika Bolton Date of Encounter: 05/23/2023  Primary Care Provider:  Thana Ates, MD Primary Cardiologist:  Nicki Guadalajara, MD  Chief Complaint    67 year old female with a history of chronic LBBB (normal coronaries by cath in 2012), cardiomyopathy, hypertension, hyperlipidemia, TIA, and MGUS who presents for follow-up related to cardiomyopathy and for preoperative cardiac evaluation.   Past Medical History    Past Medical History:  Diagnosis Date   Colon polyps 2003   Detached retina    Hypertension    Hypertensive retinopathy    Kidney stone 2016   Left bundle branch block 2012   Menorrhagia    MGUS (monoclonal gammopathy of unknown significance)    Plantar fasciitis    Precancerous lesion 1987   mole excised from vulva   PVD (posterior vitreous detachment), right 09/07/2015   TIA (transient ischemic attack) 2024   Past Surgical History:  Procedure Laterality Date   ABLATION  11/10/2002   Hysteroscopic Thermal   CATARACT EXTRACTION     cyst on scalp     sebaccous(multiple times as a teen)   EYE SURGERY     gastro surgery  07/31/2010   LAPAROSCOPIC CHOLECYSTECTOMY  11/28/2001   left leg muscle surgery  07/31/2010   OTHER SURGICAL HISTORY     Sebaceous cyst on head removed 7x's   RETINAL DETACHMENT SURGERY      Allergies  Allergies  Allergen Reactions   Other Other (See Comments)    Potassium dichromate, broke out in dry rashes   Penicillins Swelling    Throat swelling - hasn't had since was a teen   Gold Sodium Thiosulfate Rash   Nickel Rash     Labs/Other Studies Reviewed    The following studies were reviewed today:  Cardiac Studies & Procedures       ECHOCARDIOGRAM  ECHOCARDIOGRAM COMPLETE 12/11/2022  Narrative ECHOCARDIOGRAM REPORT    Patient Name:   Erika Bolton Date of Exam: 12/11/2022 Medical Rec #:  [id number]      Height:       64.0 in Accession #:    [id number]     Weight:       166.5 lb Date of  Birth:  1956-01-20      BSA:          1.810 m Patient Age:    67 years       BP:           127/71 mmHg Patient Gender: F              HR:           78 bpm. Exam Location:  [address]  Procedure: 2D Echo, 3D Echo, Cardiac Doppler, Color Doppler and Intracardiac Opacification Agent  Indications:    I42.80 Non-ischemic cardiomyopathy  History:        Patient has prior history of Echocardiogram examinations, most recent 10/02/2022. Cardiomyopathy, TIA, Arrythmias:LBBB; Risk Factors:Hypertension and Dyslipidemia.  Sonographer:    Cathie Beams RCS Referring Phys: [phone] Shaneya Taketa C Asia Dusenbury  IMPRESSIONS   1. Left ventricular ejection fraction, by estimation, is 50 to 55%. Left ventricular ejection fraction by 3D volume is 50 %. The left ventricle has low normal function. The left ventricle has no regional wall motion abnormalities. Left ventricular diastolic parameters are consistent with Grade I diastolic dysfunction (impaired relaxation). 2. Right ventricular systolic function is normal. The right ventricular size is normal. Tricuspid regurgitation signal is inadequate for  assessing PA pressure. 3. The mitral valve is normal in structure. No evidence of mitral valve regurgitation. No evidence of mitral stenosis. 4. The aortic valve is normal in structure. Aortic valve regurgitation is not visualized. No aortic stenosis is present. 5. The inferior vena cava is normal in size with greater than 50% respiratory variability, suggesting right atrial pressure of 3 mmHg.  FINDINGS Left Ventricle: Left ventricular ejection fraction, by estimation, is 50 to 55%. Left ventricular ejection fraction by 3D volume is 50 %. The left ventricle has low normal function. The left ventricle has no regional wall motion abnormalities. The left ventricular internal cavity size was normal in size. There is no left ventricular hypertrophy. Abnormal (paradoxical) septal motion, consistent with left bundle branch block.  Left ventricular diastolic parameters are consistent with Grade I diastolic dysfunction (impaired relaxation). Normal left ventricular filling pressure.  Right Ventricle: The right ventricular size is normal. No increase in right ventricular wall thickness. Right ventricular systolic function is normal. Tricuspid regurgitation signal is inadequate for assessing PA pressure.  Left Atrium: Left atrial size was normal in size.  Right Atrium: Right atrial size was normal in size.  Pericardium: There is no evidence of pericardial effusion.  Mitral Valve: The mitral valve is normal in structure. No evidence of mitral valve regurgitation. No evidence of mitral valve stenosis.  Tricuspid Valve: The tricuspid valve is normal in structure. Tricuspid valve regurgitation is not demonstrated. No evidence of tricuspid stenosis.  Aortic Valve: The aortic valve is normal in structure. Aortic valve regurgitation is not visualized. No aortic stenosis is present.  Pulmonic Valve: The pulmonic valve was normal in structure. Pulmonic valve regurgitation is trivial. No evidence of pulmonic stenosis.  Aorta: The aortic root is normal in size and structure.  Venous: The inferior vena cava is normal in size with greater than 50% respiratory variability, suggesting right atrial pressure of 3 mmHg.  IAS/Shunts: No atrial level shunt detected by color flow Doppler.   LEFT VENTRICLE PLAX 2D LVIDd:         4.30 cm         Diastology LVIDs:         2.80 cm         LV e' medial:    5.55 cm/s LV PW:         1.10 cm         LV E/e' medial:  10.9 LV IVS:        1.20 cm         LV e' lateral:   6.42 cm/s LVOT diam:     2.20 cm         LV E/e' lateral: 9.4 LV SV:         66 LV SV Index:   37 LVOT Area:     3.80 cm        3D Volume EF LV 3D EF:    Left ventricul ar ejection fraction by 3D volume is 50 %.  3D Volume EF: 3D EF:        50 % LV EDV:       151 ml LV ESV:       75 ml LV SV:        76  ml  RIGHT VENTRICLE RV Basal diam:  2.50 cm RV Mid diam:    1.80 cm RV S prime:     12.20 cm/s TAPSE (M-mode): 2.3 cm  LEFT ATRIUM  Index        RIGHT ATRIUM           Index LA diam:      3.40 cm 1.88 cm/m   RA Area:     10.10 cm LA Vol (A2C): 45.6 ml 25.20 ml/m  RA Volume:   18.70 ml  10.33 ml/m LA Vol (A4C): 17.0 ml 9.39 ml/m AORTIC VALVE LVOT Vmax:   99.80 cm/s LVOT Vmean:  66.000 cm/s LVOT VTI:    0.174 m  AORTA Ao Root diam: 3.50 cm Ao Asc diam:  3.50 cm  MITRAL VALVE MV Area (PHT): 3.13 cm    SHUNTS MV Decel Time: 242 msec    Systemic VTI:  0.17 m MV E velocity: 60.30 cm/s  Systemic Diam: 2.20 cm MV A velocity: 81.80 cm/s MV E/A ratio:  0.74  Armanda Magic MD Electronically signed by Armanda Magic MD Signature Date/Time: 12/11/2022/8:08:08 PM    Final     CT SCANS  CT CORONARY MORPH W/CTA COR W/SCORE 10/03/2022  Addendum 10/05/2022  9:42 AM ADDENDUM REPORT: 10/05/2022 09:40  EXAM: OVER-READ INTERPRETATION  CT CHEST  The following report is an over-read performed by radiologist Dr. Tobias Alexander [facility] on 10/05/2022. This over-read does not include interpretation of cardiac or coronary anatomy or pathology. The coronary CTA interpretation by the cardiologist is attached.  COMPARISON:  None.  FINDINGS: Heart size within normal limits. No enlarged lymph nodes within the visualized mediastinum or hila. Visualized upper abdominal organs are unremarkable. No acute abnormality of the visualized chest wall or osseous structures.  5 mm subpleural nodule in the left lateral lung base (image 33, series 609) is unchanged since 02/04/2016 which indicates a benign etiology. Visualized lungs are otherwise clear.  IMPRESSION: No significant incidental extracardiac findings.   Electronically Signed By: Acquanetta Belling M.D. On: 10/05/2022 09:40  Narrative CLINICAL DATA:  67 y.o. female with a hx of normal coronaries in 2012 on cath,  chronic LBBB, HTN, GERD, MGUS, cervical spine stenosis who is being seen 10/02/2022 for the evaluation of abnormal Echo at the request of Dr. Caleb Popp after admit for TIA. Echo now EF 35%, hypokinesis of inf wall and apex. Mild LVH, G1DD, RV is normal  EXAM: Cardiac/Coronary  CTA  TECHNIQUE: The patient was scanned on a [employer].  FINDINGS: A 70 kV prospective scan was triggered in the descending thoracic aorta at 111 HU's. Axial non-contrast 3 mm slices were carried out through the heart. The data set was analyzed on a dedicated work station and scored using the Agatson method. Gantry rotation speed was 250 msecs and collimation was .6 mm. 0.8 mg of sl NTG was given. The 3D data set was reconstructed in 5% intervals of the 67-82 % of the R-R cycle. Diastolic phases were analyzed on a dedicated work station using MPR, MIP and VRT modes. The patient received 80 cc of contrast.  Image quality: good  Aorta: Normal size (33 mm ascending) . No calcifications. No dissection.  Aortic Valve: No calcifications.  Coronary Arteries:  Normal coronary origin.  Right dominance.  RCA is a large dominant artery that gives rise to PDA and PLA. There is no plaque.  Left main is a large artery that gives rise to LAD and LCX arteries.  LAD is a large vessel that has no plaque.  D1 is a large, high take off diagonal, no plaque  D2-3 small caliber, no plaque  LCX is a non-dominant artery.  There is  no plaque.  OM1 and 2 are small and under filled.  Other findings:  Normal pulmonary vein drainage into the left atrium.  Normal left atrial appendage without a thrombus.  Normal size of the pulmonary artery.  Please see radiology report for non cardiac findings.  IMPRESSION: 1. Coronary calcium score of 0.  2. Normal coronary origin with right dominance.  3. No evidence of CAD.  Non ischemic cardiomyopathy.  CAD-RADS 0.  No evidence of CAD (0%).  Electronically  Signed: By: Donato Schultz M.D. On: 10/03/2022 13:51         Recent Labs: 01/18/2023: Hemoglobin 13.7; Platelets 240 05/18/2023: ALT 21; BUN 18; Creatinine, Ser 0.68; Potassium 4.7; Sodium 142  Recent Lipid Panel    Component Value Date/Time   CHOL 131 05/18/2023 0906   TRIG 96 05/18/2023 0906   HDL 64 05/18/2023 0906   CHOLHDL 2.0 05/18/2023 0906   CHOLHDL 3.0 10/02/2022 0245   VLDL 14 10/02/2022 0245   LDLCALC 49 05/18/2023 0906    History of Present Illness    67 year old female with the above past medical history including chronic LBBB (normal coronaries by showed normal Virgel Gess arteries.), cardiomyopathy, hypertension, hyperlipidemia, TIA, and MGUS.   She was seen in 2012 during hospitalization. Cardiac catheterization in the setting of LBBB, abnormal nuclear study, showed normal coronary arteries.  Echocardiogram at the time showed septal wall motion abnormality, consistent with LBBB.  She presented to the ED on 10/01/2022 with right-sided numbness, right-sided facial numbness, elevated BP in the setting of TIA.  CT of the head was negative for acute findings, no evidence of large vessel occlusion on CTA of the head and neck.  Echocardiogram showed EF 35%, hypokinesis of inferior wall and apex, mild LVH, G1 DD, normal RV. Cardiology was consulted in the setting of abnormal echocardiogram. EKG showed sinus tachycardia, LBBB, no acute changes.  She denied chest pain, however, she did note some mild dyspnea on exertion. Coronary CTA showed coronary calcium score of 0, no evidence of CAD. She returned to the ED on 10/04/2022 with tingling to the right side of her face, right arm and right leg, similar to prior symptoms.  Neurology was consulted, no additional workup was recommended.  Echocardiogram in 11/2022 showed EF 50 to 55%, no RWMA, G1 DD, normal RV, no significant valvular abnormalities.  She was last seen in the office on 12/19/2022 and was stable from a cardiac standpoint.   She  presents today for follow-up and for preoperative cardiac evaluation for upcoming colonoscopy with Dr. Marca Ancona of [facility]. Since her last visit he has done well from a cardiac standpoint.  She notes rare shortness of breath at rest, she denies any chest pain or exertional symptoms. She denies palpitations, dizziness, edema, PND, orthopnea, weight gain.  She is currently dealing with a flare of plantar fasciitis, otherwise, she reports feeling well.  Home Medications    Current Outpatient Medications  Medication Sig Dispense Refill   alendronate (FOSAMAX) 70 MG tablet Take 70 mg by mouth once a week. Take with a full glass of water on an empty stomach.     aspirin EC 81 MG tablet Take 1 tablet (81 mg total) by mouth daily. Swallow whole. 90 tablet 3   carboxymethylcellulose (REFRESH PLUS) 0.5 % SOLN Place 1 drop into both eyes daily as needed (for dry eye).     cholecalciferol (VITAMIN D) 1000 UNITS tablet Take 2,000 Units by mouth daily.     COVID-19 mRNA vaccine  0454-0981 (COMIRNATY) syringe Inject into the muscle. 0.3 mL 0   COVID-19 mRNA vaccine, Pfizer, (COMIRNATY) syringe Inject 0.3 mLs into the muscle once for 1 dose. 0.3 mL 0   cyanocobalamin (VITAMIN B12) 1000 MCG tablet Take 1,000 mcg by mouth every other day.     famotidine (PEPCID) 40 MG tablet Take 40 mg by mouth daily.     meloxicam (MOBIC) 15 MG tablet Take 1 tablet (15 mg total) by mouth daily. 60 tablet 1   metoprolol succinate (TOPROL-XL) 25 MG 24 hr tablet Take 1 tablet (25 mg total) by mouth daily. 90 tablet 3   metroNIDAZOLE (METROGEL) 0.75 % gel Apply 1 Application topically daily.     Multiple Vitamins-Minerals (WOMENS MULTIVITAMIN PLUS PO) Take 1 tablet by mouth daily.     Omega 3 1200 MG CAPS Take 2 capsules by mouth daily.     rosuvastatin (CRESTOR) 20 MG tablet Take 1 tablet (20 mg total) by mouth daily. 90 tablet 3   tretinoin (RETIN-A) 0.025 % cream Apply 1 application  topically at bedtime.     UNABLE TO FIND Take 2  capsules by mouth at bedtime. Bone Up - calcium supplement     Estradiol (YUVAFEM) 10 MCG TABS vaginal tablet Place 1 tablet (10 mcg total) vaginally 2 (two) times a week. (Patient not taking: Reported on 05/23/2023) 24 tablet 0   losartan (COZAAR) 25 MG tablet Take 0.5 tablets (12.5 mg total) by mouth in the morning and at bedtime. 90 tablet 3   No current facility-administered medications for this visit.     Review of Systems    She denies chest pain, palpitations, pnd, orthopnea, n, v, dizziness, syncope, edema, weight gain, or early satiety. All other systems reviewed and are otherwise negative except as noted above.   Physical Exam    VS:  BP 138/72   Pulse 74   Ht 5\' 4"  (1.626 m)   Wt 172 lb (78 kg)   LMP 08/01/2007 (Approximate)   SpO2 97%   BMI 29.52 kg/m   GEN: Well nourished, well developed, in no acute distress. HEENT: normal. Neck: Supple, no JVD, carotid bruits, or masses. Cardiac: RRR, no murmurs, rubs, or gallops. No clubbing, cyanosis, edema.  Radials/DP/PT 2+ and equal bilaterally.  Respiratory:  Respirations regular and unlabored, clear to auscultation bilaterally. GI: Soft, nontender, nondistended, BS + x 4. MS: no deformity or atrophy. Skin: warm and dry, no rash. Neuro:  Strength and sensation are intact. Psych: Normal affect.  Accessory Clinical Findings    ECG personally reviewed by me today - EKG Interpretation Date/Time:  Wednesday May 23 2023 10:58:48 EDT Ventricular Rate:  74 PR Interval:  166 QRS Duration:  160 QT Interval:  432 QTC Calculation: 479 R Axis:   -51  Text Interpretation: Normal sinus rhythm Left bundle branch block When compared with ECG of 04-Oct-2022 11:07, No significant change was found Confirmed by Bernadene Person ([id number]) on 05/23/2023 11:03:23 AM  - no acute changes.   Lab Results  Component Value Date   WBC 5.0 01/18/2023   HGB 13.7 01/18/2023   HCT 40.8 01/18/2023   MCV 90.7 01/18/2023   PLT 240 01/18/2023   Lab  Results  Component Value Date   CREATININE 0.68 05/18/2023   BUN 18 05/18/2023   NA 142 05/18/2023   K 4.7 05/18/2023   CL 106 05/18/2023   CO2 24 05/18/2023   Lab Results  Component Value Date   ALT 21 05/18/2023   AST  19 05/18/2023   ALKPHOS 77 05/18/2023   BILITOT 0.5 05/18/2023   Lab Results  Component Value Date   CHOL 131 05/18/2023   HDL 64 05/18/2023   LDLCALC 49 05/18/2023   TRIG 96 05/18/2023   CHOLHDL 2.0 05/18/2023    Lab Results  Component Value Date   HGBA1C 5.6 10/02/2022    Assessment & Plan    1. NICM: Prior cath in 2012 showed normal coronary arteries. EF was 35% in 09/2022. Coronary CTA in 09/2022 showed coronary calcium score of 0, no evidence of CAD.  Repeat echo in 11/2022 showed EF 50 to 55%, no RWMA, G1 DD, normal RV, no significant valvular abnormalities. Possible stress-induced cardiomyopathy. Euvolemic and well compensated on exam. Continue metoprolol, losartan.   2. LBBB: Chronic, stable.    3. Hypertension: BP well controlled. Continue current antihypertensive regimen.    4. Hyperlipidemia: LDL was 49 in 05/2023. Continue Crestor.   5. History of TIA: Following with neurology. Continue Aspirin.    6. Preoperative cardiac exam: According to the Revised Cardiac Risk Index (RCRI), her Perioperative Risk of Major Cardiac Event is (%): 0.9. Her Functional Capacity in METs is: 8.97 according to the Duke Activity Status Index (DASI).Therefore, based on ACC/AHA guidelines, patient would be at acceptable risk for the planned procedure without further cardiovascular testing. I will route this recommendation to the requesting party via Epic fax function.  7. Disposition: Follow-up in 6 months.       Joylene Grapes, NP 05/23/2023, 12:00 PM

## 2023-06-06 ENCOUNTER — Other Ambulatory Visit: Payer: Medicare Other

## 2023-06-12 ENCOUNTER — Ambulatory Visit: Payer: Medicare Other

## 2023-06-12 DIAGNOSIS — M722 Plantar fascial fibromatosis: Secondary | ICD-10-CM | POA: Diagnosis not present

## 2023-06-13 ENCOUNTER — Encounter (INDEPENDENT_AMBULATORY_CARE_PROVIDER_SITE_OTHER): Payer: Medicare Other | Admitting: Ophthalmology

## 2023-06-13 DIAGNOSIS — H35033 Hypertensive retinopathy, bilateral: Secondary | ICD-10-CM

## 2023-06-13 DIAGNOSIS — H35373 Puckering of macula, bilateral: Secondary | ICD-10-CM

## 2023-06-13 DIAGNOSIS — Z8669 Personal history of other diseases of the nervous system and sense organs: Secondary | ICD-10-CM

## 2023-06-13 DIAGNOSIS — Z961 Presence of intraocular lens: Secondary | ICD-10-CM

## 2023-06-13 DIAGNOSIS — I10 Essential (primary) hypertension: Secondary | ICD-10-CM

## 2023-06-14 NOTE — Progress Notes (Signed)
  Patient was seen, measured / scanned for custom molded foot orthotics.  Patient will benefit from CFO's as they will help provide total contact to MLA's helping to better distribute body weight across BIL feet greater reducing plantar pressure and pain and to also encourage FF and RF alignment.  Patient was scanned items to be ordered and fit when in  [employer], CFo, CFm

## 2023-06-19 ENCOUNTER — Ambulatory Visit: Payer: Medicare Other | Admitting: Cardiovascular Disease

## 2023-06-28 ENCOUNTER — Other Ambulatory Visit: Payer: Self-pay | Admitting: Podiatry

## 2023-07-04 NOTE — Progress Notes (Shared)
[facility] - Clinic Note  07/11/2023     CHIEF COMPLAINT Patient presents for No chief complaint on file.    HISTORY OF PRESENT ILLNESS: Erika Bolton is a 67 y.o. female who presents to the clinic today for:     Referring physician: Sinda Du, MD [address] [place], [place] [address]  HISTORICAL INFORMATION:   Selected notes from the MEDICAL RECORD NUMBER Referred by Dr. Cathey Endow for ERM OU LEE: 02/15/2021 BCVA OD: 20/30 OS: 20/50 Ocular Hx- history of cryo for retinal tear OD, ERM, cataracts PMH- HTN, anxiety    CURRENT MEDICATIONS: Current Outpatient Medications (Ophthalmic Drugs)  Medication Sig   carboxymethylcellulose (REFRESH PLUS) 0.5 % SOLN Place 1 drop into both eyes daily as needed (for dry eye).   No current facility-administered medications for this visit. (Ophthalmic Drugs)   Current Outpatient Medications (Other)  Medication Sig   alendronate (FOSAMAX) 70 MG tablet Take 70 mg by mouth once a week. Take with a full glass of water on an empty stomach.   aspirin EC 81 MG tablet Take 1 tablet (81 mg total) by mouth daily. Swallow whole.   cholecalciferol (VITAMIN D) 1000 UNITS tablet Take 2,000 Units by mouth daily.   COVID-19 mRNA vaccine 2023-2024 (COMIRNATY) syringe Inject into the muscle.   cyanocobalamin (VITAMIN B12) 1000 MCG tablet Take 1,000 mcg by mouth every other day.   Estradiol (YUVAFEM) 10 MCG TABS vaginal tablet Place 1 tablet (10 mcg total) vaginally 2 (two) times a week. (Patient not taking: Reported on 05/23/2023)   famotidine (PEPCID) 40 MG tablet Take 40 mg by mouth daily.   losartan (COZAAR) 25 MG tablet Take 0.5 tablets (12.5 mg total) by mouth in the morning and at bedtime.   meloxicam (MOBIC) 15 MG tablet TAKE 1 TABLET (15 MG TOTAL) BY MOUTH DAILY.   metoprolol succinate (TOPROL-XL) 25 MG 24 hr tablet Take 1 tablet (25 mg total) by mouth daily.   metroNIDAZOLE (METROGEL) 0.75 % gel Apply 1 Application  topically daily.   Multiple Vitamins-Minerals (WOMENS MULTIVITAMIN PLUS PO) Take 1 tablet by mouth daily.   Omega 3 1200 MG CAPS Take 2 capsules by mouth daily.   rosuvastatin (CRESTOR) 20 MG tablet Take 1 tablet (20 mg total) by mouth daily.   tretinoin (RETIN-A) 0.025 % cream Apply 1 application  topically at bedtime.   UNABLE TO FIND Take 2 capsules by mouth at bedtime. Bone Up - calcium supplement   No current facility-administered medications for this visit. (Other)   REVIEW OF SYSTEMS:    ALLERGIES Allergies  Allergen Reactions   Other Other (See Comments)    Potassium dichromate, broke out in dry rashes   Penicillins Swelling    Throat swelling - hasn't had since was a teen   Gold Sodium Thiosulfate Rash   Nickel Rash   PAST MEDICAL HISTORY Past Medical History:  Diagnosis Date   Colon polyps 2003   Detached retina    Hypertension    Hypertensive retinopathy    Kidney stone 2016   Left bundle branch block 2012   Menorrhagia    MGUS (monoclonal gammopathy of unknown significance)    Plantar fasciitis    Precancerous lesion 1987   mole excised from vulva   PVD (posterior vitreous detachment), right 09/07/2015   TIA (transient ischemic attack) 2024   Past Surgical History:  Procedure Laterality Date   ABLATION  11/10/2002   Hysteroscopic Thermal   CATARACT EXTRACTION  cyst on scalp     sebaccous(multiple times as a teen)   EYE SURGERY     gastro surgery  07/31/2010   LAPAROSCOPIC CHOLECYSTECTOMY  11/28/2001   left leg muscle surgery  07/31/2010   OTHER SURGICAL HISTORY     Sebaceous cyst on head removed 7x's   RETINAL DETACHMENT SURGERY      FAMILY HISTORY Family History  Problem Relation Age of Onset   Colon cancer Father 17   Alzheimer's disease Father    Emphysema Father    COPD Father    Heart attack Mother    Cancer Brother        peritod gland cancer   Breast cancer Maternal Aunt        post menopause   SOCIAL HISTORY Social History    Tobacco Use   Smoking status: Never   Smokeless tobacco: Never  Vaping Use   Vaping status: Never Used  Substance Use Topics   Alcohol use: Yes    Alcohol/week: 3.0 standard drinks of alcohol    Types: 3 Glasses of wine per week   Drug use: No       OPHTHALMIC EXAM: Not recorded    IMAGING AND PROCEDURES  Imaging and Procedures for 07/11/2023          ASSESSMENT/PLAN:  No diagnosis found.  1. Epiretinal membrane, OU (OD>OS) - BCVA 20/20 OU -- stable - mild metamorphopsia improved OD - OCT shows stable ERM and stable improvement in foveal contour OD - no indication for surgery at this time - monitor - f/u 6-9 mos, sooner prn-- DFE/OCT   2. History of retinal detachment OD  - s/p pneumatic retinopexy OD 02.24.18 at [facility] w/ Dr. Ollen Bowl  - HST at 1100 -- good cryo  - retina stably reattached, no RT/RD  3,4. Hypertensive retinopathy OU - discussed importance of tight BP control - monitor  5. Pseudophakia OU  - s/p CE/IOL OU (Dr. Cathey Endow - mid-September '22)  - OD with CTR -- no pseudophakodonesis  - IOLs in good position, doing well  - monitor  Ophthalmic Meds Ordered this visit:  No orders of the defined types were placed in this encounter.    No follow-ups on file.  There are no Patient Instructions on file for this visit.  Explained the diagnoses, plan, and follow up with the patient and they expressed understanding.  Patient expressed understanding of the importance of proper follow up care.   This document serves as a record of services personally performed by Karie Chimera, MD, PhD. It was created on their behalf by De Blanch, an ophthalmic technician. The creation of this record is the provider's dictation and/or activities during the visit.    Electronically signed by: De Blanch, OA, 07/04/23  9:49 AM  This document serves as a record of services personally performed by Karie Chimera, MD, PhD. It was created on their behalf by  Glee Arvin. Manson Passey, OA an ophthalmic technician. The creation of this record is the provider's dictation and/or activities during the visit.    Electronically signed by: Glee Arvin. Manson Passey, [place] 03.27.2024 9:49 AM  Karie Chimera, M.D., Ph.D. Diseases & Surgery of the Retina and Vitreous [facility]  I have reviewed the above documentation for accuracy and completeness, and I agree with the above. Karie Chimera, M.D., Ph.D. 10/26/22 9:49 AM  Abbreviations: M myopia (nearsighted); A astigmatism; H hyperopia (farsighted); P presbyopia; Mrx spectacle prescription;  CTL contact lenses;  OD right eye; OS left eye; OU both eyes  XT exotropia; ET esotropia; PEK punctate epithelial keratitis; PEE punctate epithelial erosions; DES dry eye syndrome; MGD meibomian gland dysfunction; ATs artificial tears; PFAT's preservative free artificial tears; NSC nuclear sclerotic cataract; PSC posterior subcapsular cataract; ERM epi-retinal membrane; PVD posterior vitreous detachment; RD retinal detachment; DM diabetes mellitus; DR diabetic retinopathy; NPDR non-proliferative diabetic retinopathy; PDR proliferative diabetic retinopathy; CSME clinically significant macular edema; DME diabetic macular edema; dbh dot blot hemorrhages; CWS cotton wool spot; POAG primary open angle glaucoma; C/D cup-to-disc ratio; HVF humphrey visual field; GVF goldmann visual field; OCT optical coherence tomography; IOP intraocular pressure; BRVO Branch retinal vein occlusion; CRVO central retinal vein occlusion; CRAO central retinal artery occlusion; BRAO branch retinal artery occlusion; RT retinal tear; SB scleral buckle; PPV pars plana vitrectomy; VH Vitreous hemorrhage; PRP panretinal laser photocoagulation; IVK intravitreal kenalog; VMT vitreomacular traction; MH Macular hole;  NVD neovascularization of the disc; NVE neovascularization elsewhere; AREDS age related eye disease study; ARMD age related macular degeneration; POAG  primary open angle glaucoma; EBMD epithelial/anterior basement membrane dystrophy; ACIOL anterior chamber intraocular lens; IOL intraocular lens; PCIOL posterior chamber intraocular lens; Phaco/IOL phacoemulsification with intraocular lens placement; PRK photorefractive keratectomy; LASIK laser assisted in situ keratomileusis; HTN hypertension; DM diabetes mellitus; COPD chronic obstructive pulmonary disease

## 2023-07-11 ENCOUNTER — Encounter (INDEPENDENT_AMBULATORY_CARE_PROVIDER_SITE_OTHER): Payer: Medicare Other | Admitting: Ophthalmology

## 2023-07-11 DIAGNOSIS — H35373 Puckering of macula, bilateral: Secondary | ICD-10-CM

## 2023-07-11 DIAGNOSIS — Z961 Presence of intraocular lens: Secondary | ICD-10-CM

## 2023-07-11 DIAGNOSIS — H35033 Hypertensive retinopathy, bilateral: Secondary | ICD-10-CM

## 2023-07-11 DIAGNOSIS — I10 Essential (primary) hypertension: Secondary | ICD-10-CM

## 2023-07-11 DIAGNOSIS — Z8669 Personal history of other diseases of the nervous system and sense organs: Secondary | ICD-10-CM

## 2023-07-12 ENCOUNTER — Ambulatory Visit (INDEPENDENT_AMBULATORY_CARE_PROVIDER_SITE_OTHER): Payer: Medicare Other

## 2023-07-12 DIAGNOSIS — M722 Plantar fascial fibromatosis: Secondary | ICD-10-CM | POA: Diagnosis not present

## 2023-07-12 DIAGNOSIS — M2141 Flat foot [pes planus] (acquired), right foot: Secondary | ICD-10-CM

## 2023-07-12 NOTE — Progress Notes (Signed)
Patient presents today to pick up custom molded foot orthotics, diagnosed with PF by Dr. Logan Bores .   Orthotics were dispensed and fit was satisfactory. Reviewed instructions for break-in and wear. Written instructions given to patient.  Patient will follow up as needed.  Erika Bolton CPed, CFo, CFm

## 2023-07-15 ENCOUNTER — Emergency Department (HOSPITAL_BASED_OUTPATIENT_CLINIC_OR_DEPARTMENT_OTHER): Payer: Medicare Other | Admitting: Radiology

## 2023-07-15 ENCOUNTER — Other Ambulatory Visit: Payer: Self-pay

## 2023-07-15 ENCOUNTER — Emergency Department (HOSPITAL_BASED_OUTPATIENT_CLINIC_OR_DEPARTMENT_OTHER)
Admission: EM | Admit: 2023-07-15 | Discharge: 2023-07-16 | Disposition: A | Discharge status: Home / Self Care | Payer: Medicare Other | Attending: Emergency Medicine | Admitting: Emergency Medicine

## 2023-07-15 DIAGNOSIS — H1032 Unspecified acute conjunctivitis, left eye: Secondary | ICD-10-CM | POA: Diagnosis not present

## 2023-07-15 DIAGNOSIS — I10 Essential (primary) hypertension: Secondary | ICD-10-CM | POA: Insufficient documentation

## 2023-07-15 DIAGNOSIS — R052 Subacute cough: Secondary | ICD-10-CM | POA: Diagnosis not present

## 2023-07-15 DIAGNOSIS — R059 Cough, unspecified: Secondary | ICD-10-CM | POA: Diagnosis present

## 2023-07-15 DIAGNOSIS — Z7982 Long term (current) use of aspirin: Secondary | ICD-10-CM | POA: Insufficient documentation

## 2023-07-15 MED ORDER — ALBUTEROL SULFATE HFA 108 (90 BASE) MCG/ACT IN AERS: 1.0000 | INHALATION_SPRAY | Freq: Four times a day (QID) | RESPIRATORY_TRACT | 0 refills | Status: DC | PRN

## 2023-07-15 MED ORDER — ALBUTEROL SULFATE HFA 108 (90 BASE) MCG/ACT IN AERS
2.0000 | INHALATION_SPRAY | RESPIRATORY_TRACT | Status: DC | PRN
  Administered 2023-07-16: 2 via RESPIRATORY_TRACT
  Filled 2023-07-15: qty 6.7

## 2023-07-15 MED ORDER — ERYTHROMYCIN 5 MG/GM OP OINT: TOPICAL_OINTMENT | OPHTHALMIC | 0 refills | Status: DC

## 2023-07-15 MED ORDER — ERYTHROMYCIN 5 MG/GM OP OINT
TOPICAL_OINTMENT | Freq: Once | OPHTHALMIC | Status: AC
  Filled 2023-07-15: qty 3.5

## 2023-07-15 NOTE — Discharge Instructions (Signed)
As discussed, your imaging is reassuring.  Please use the erythromycin drops 4-6 times a day for the next week to ensure resolution of infection. If the medication does not help, then it is viral in nature and should resolve in 1-2 weeks on its own.  Follow-up with your primary care provider in the next week for reevaluation of your symptoms.  Return to the ED if your symptoms worsen in the interim.

## 2023-07-15 NOTE — ED Triage Notes (Signed)
Pt POV from home with URI sx and cough since Thursday. Seen by UC yesterday, dx URI, prescribed abx. Pt states today she experienced SOB after a coughing fit, decided to come in. Respirations even and unlabored in triage, NAD noted at this time.

## 2023-07-15 NOTE — ED Provider Notes (Incomplete)
[place] EMERGENCY DEPARTMENT AT [facility] Provider Note   CSN: [id number] Arrival date & time: 07/15/23  1633     History {Add pertinent medical, surgical, social history, OB history to HPI:1} Chief Complaint  Patient presents with  . Shortness of Breath    Erika Bolton is a 67 y.o. female with a history of hypertension and TIA who presents the ED today for cough.  Patient reports URI symptoms and cough for the past several days.  She states that her symptoms have worsened so she went to urgent care yesterday.  She was prescribed azithromycin for her cough.  She states that today she experienced some wheezing, shortness of breath, and chest tightness after a coughing fit and decided come in for further evaluation.  She denies any shortness of breath or chest pain at this time.  She is able to speak in full sentences.  Additionally, patient reports redness and drainage from her left eye that started today.  She denies any itching or irritated sensation.  She endorses congestion as well and subjective fevers last night.  No nausea, vomiting, or diarrhea.  No additional complaints or concerns at this time.    Home Medications Prior to Admission medications   Medication Sig Start Date End Date Taking? Authorizing Provider  albuterol (VENTOLIN HFA) 108 (90 Base) MCG/ACT inhaler Inhale 1-2 puffs into the lungs every 6 (six) hours as needed for wheezing or shortness of breath. 07/15/23 08/14/23 Yes Maxwell Marion, PA-C  erythromycin ophthalmic ointment Place a 1/2 inch ribbon of ointment into the lower eyelid 4-6 times a day for the next 7 days 07/15/23  Yes Maxwell Marion, PA-C  alendronate (FOSAMAX) 70 MG tablet Take 70 mg by mouth once a week. Take with a full glass of water on an empty stomach.    [provider]  aspirin EC 81 MG tablet Take 1 tablet (81 mg total) by mouth daily. Swallow whole. 10/04/22 10/04/23  Narda Bonds, MD  carboxymethylcellulose (REFRESH PLUS)  0.5 % SOLN Place 1 drop into both eyes daily as needed (for dry eye).    [provider]  cholecalciferol (VITAMIN D) 1000 UNITS tablet Take 2,000 Units by mouth daily.    [provider]  COVID-19 mRNA vaccine [phone] (COMIRNATY) syringe Inject into the muscle. 05/23/22   Judyann Munson, MD  cyanocobalamin (VITAMIN B12) 1000 MCG tablet Take 1,000 mcg by mouth every other day.    [provider]  Estradiol (YUVAFEM) 10 MCG TABS vaginal tablet Place 1 tablet (10 mcg total) vaginally 2 (two) times a week. Patient not taking: Reported on 05/23/2023 03/15/23   Patton Salles, MD  famotidine (PEPCID) 40 MG tablet Take 40 mg by mouth daily. 09/27/22   [provider]  losartan (COZAAR) 25 MG tablet Take 0.5 tablets (12.5 mg total) by mouth in the morning and at bedtime. 01/24/23 02/23/23  Lennette Bihari, MD  meloxicam (MOBIC) 15 MG tablet TAKE 1 TABLET (15 MG TOTAL) BY MOUTH DAILY. 07/01/23 10/29/23  Barbaraann Share, DPM  metoprolol succinate (TOPROL-XL) 25 MG 24 hr tablet Take 1 tablet (25 mg total) by mouth daily. 12/19/22   Lennette Bihari, MD  metroNIDAZOLE (METROGEL) 0.75 % gel Apply 1 Application topically daily.    [provider]  Multiple Vitamins-Minerals (WOMENS MULTIVITAMIN PLUS PO) Take 1 tablet by mouth daily.    [provider]  Omega 3 1200 MG CAPS Take 2 capsules by mouth daily.  [provider]  rosuvastatin (CRESTOR) 20 MG tablet Take 1 tablet (20 mg total) by mouth daily. 10/17/22 10/12/23  Sater, Pearletha Furl, MD  tretinoin (RETIN-A) 0.025 % cream Apply 1 application  topically at bedtime. 06/30/21   [provider]  UNABLE TO FIND Take 2 capsules by mouth at bedtime. Bone Up - calcium supplement    [provider]      Allergies    Other, Penicillins, Gold sodium thiosulfate, and Nickel    Review of Systems   Review of Systems  Respiratory:  Positive for shortness of breath.   All other systems  reviewed and are negative.   Physical Exam Updated Vital Signs BP (!) 133/55   Pulse (!) 101   Temp 98.2 F (36.8 C)   Resp 16   Ht 5\' 4"  (1.626 m)   Wt 74.8 kg   LMP 08/01/2007 (Approximate)   SpO2 100%   BMI 28.32 kg/m  Physical Exam Vitals and nursing note reviewed.  Constitutional:      General: She is not in acute distress.    Appearance: Normal appearance.  HENT:     Head: Normocephalic and atraumatic.     Mouth/Throat:     Mouth: Mucous membranes are moist.  Eyes:     General:        Left eye: Discharge present.    Pupils: Pupils are equal, round, and reactive to light.     Comments: Conjunctival injections of the left eye with drainage.  Right right eye is normal.  Cardiovascular:     Rate and Rhythm: Normal rate and regular rhythm.     Pulses: Normal pulses.     Heart sounds: Normal heart sounds.  Pulmonary:     Effort: Pulmonary effort is normal.     Breath sounds: Normal breath sounds.  Abdominal:     Palpations: Abdomen is soft.     Tenderness: There is no abdominal tenderness.  Musculoskeletal:     Cervical back: Normal range of motion.  Skin:    General: Skin is warm and dry.     Findings: No rash.  Neurological:     General: No focal deficit present.     Mental Status: She is alert.  Psychiatric:        Mood and Affect: Mood normal.        Behavior: Behavior normal.    ED Results / Procedures / Treatments   Labs (all labs ordered are listed, but only abnormal results are displayed) Labs Reviewed - No data to display  EKG None  Radiology DG Chest 2 View Result Date: 07/15/2023 CLINICAL DATA:  Cough and shortness of breath for several days. EXAM: CHEST - 2 VIEW COMPARISON:  05/11/2011 FINDINGS: The heart size and mediastinal contours are within normal limits. Both lungs are clear. The visualized skeletal structures are unremarkable. IMPRESSION: No active cardiopulmonary disease. Electronically Signed   By: Danae Orleans M.D.   On: 07/15/2023  17:33    Procedures Procedures: not indicated. {Document cardiac monitor, telemetry assessment procedure when appropriate:1}  Medications Ordered in ED Medications  albuterol (VENTOLIN HFA) 108 (90 Base) MCG/ACT inhaler 2 puff (has no administration in time range)  erythromycin ophthalmic ointment (has no administration in time range)    ED Course/ Medical Decision Making/ A&P   {   Click here for ABCD2, HEART and other calculatorsREFRESH Note before signing :1}  Medical Decision Making Amount and/or Complexity of Data Reviewed Radiology: ordered.  Risk Prescription drug management.   This patient presents to the ED for concern of cough, this involves an extensive number of treatment options, and is a complaint that carries with it a high risk of complications and morbidity.   Differential diagnosis includes: URI, bronchitis, pneumonia, etc.   Comorbidities  See HPI above   Additional History  Additional history obtained from patient's urgent care records, which she has at bedside.   Cardiac Monitoring / EKG  The patient was maintained on a cardiac monitor.  I personally viewed and interpreted the cardiac monitored which showed: NSR with LBBB, heart rate of 99 bpm.   Imaging Studies  I ordered imaging studies including CXR  I independently visualized and interpreted imaging which showed: No active cardiopulmonary disease. I agree with the radiologist interpretation   Problem List / ED Course / Critical Interventions / Medication Management  Cough I ordered medications including: Erythromycin eye drops for conjunctivitis And albuterol inhaler was ordered in triage.  Patient is not having wheezing at this time but I told her to take the inhaler at home in case she develops shortness of breath and wheezing later on. I have reviewed the patients home medicines and have made adjustments as needed   Social Determinants of  Health  Access to healthcare   Test / Admission - Considered  Discussed findings with patient. Patient is hemodynamically stable and safe for discharge home. Return precautions provided.  {Document critical care time when appropriate:1} {Document review of labs and clinical decision tools ie heart score, Chads2Vasc2 etc:1}  {Document your independent review of radiology images, and any outside records:1} {Document your discussion with family members, caretakers, and with consultants:1} {Document social determinants of health affecting pt's care:1} {Document your decision making why or why not admission, treatments were needed:1} Final Clinical Impression(s) / ED Diagnoses Final diagnoses:  Subacute cough    Rx / DC Orders ED Discharge Orders          Ordered    albuterol (VENTOLIN HFA) 108 (90 Base) MCG/ACT inhaler  Every 6 hours PRN        07/15/23 2335    erythromycin ophthalmic ointment        07/15/23 2335

## 2023-07-15 NOTE — ED Provider Notes (Signed)
[place] EMERGENCY DEPARTMENT AT [facility] Provider Note   CSN: [id number] Arrival date & time: 07/15/23  1633     History  Chief Complaint  Patient presents with   Shortness of Breath    Erika Bolton is a 67 y.o. female with a history of hypertension and TIA who presents the ED today for cough.  Patient reports URI symptoms and cough for the past several days.  She states that her symptoms have worsened so she went to urgent care yesterday.  She was prescribed azithromycin for her cough.  She states that today she experienced some wheezing, shortness of breath, and chest tightness after a coughing fit and decided come in for further evaluation.  She denies any shortness of breath or chest pain at this time.  She is able to speak in full sentences.  Additionally, patient reports redness and drainage from her left eye that started today.  She denies any itching or irritated sensation.  She endorses congestion as well and subjective fevers last night.  No nausea, vomiting, or diarrhea.  No additional complaints or concerns at this time.    Home Medications Prior to Admission medications   Medication Sig Start Date End Date Taking? Authorizing Provider  albuterol (VENTOLIN HFA) 108 (90 Base) MCG/ACT inhaler Inhale 1-2 puffs into the lungs every 6 (six) hours as needed for wheezing or shortness of breath. 07/15/23 08/14/23 Yes Maxwell Marion, PA-C  erythromycin ophthalmic ointment Place a 1/2 inch ribbon of ointment into the lower eyelid 4-6 times a day for the next 7 days 07/15/23  Yes Maxwell Marion, PA-C  alendronate (FOSAMAX) 70 MG tablet Take 70 mg by mouth once a week. Take with a full glass of water on an empty stomach.    [provider]  aspirin EC 81 MG tablet Take 1 tablet (81 mg total) by mouth daily. Swallow whole. 10/04/22 10/04/23  Narda Bonds, MD  carboxymethylcellulose (REFRESH PLUS) 0.5 % SOLN Place 1 drop into both eyes daily as needed (for dry eye).     [provider]  cholecalciferol (VITAMIN D) 1000 UNITS tablet Take 2,000 Units by mouth daily.    [provider]  COVID-19 mRNA vaccine [phone] (COMIRNATY) syringe Inject into the muscle. 05/23/22   Judyann Munson, MD  cyanocobalamin (VITAMIN B12) 1000 MCG tablet Take 1,000 mcg by mouth every other day.    [provider]  Estradiol (YUVAFEM) 10 MCG TABS vaginal tablet Place 1 tablet (10 mcg total) vaginally 2 (two) times a week. Patient not taking: Reported on 05/23/2023 03/15/23   Patton Salles, MD  famotidine (PEPCID) 40 MG tablet Take 40 mg by mouth daily. 09/27/22   [provider]  losartan (COZAAR) 25 MG tablet Take 0.5 tablets (12.5 mg total) by mouth in the morning and at bedtime. 01/24/23 02/23/23  Lennette Bihari, MD  meloxicam (MOBIC) 15 MG tablet TAKE 1 TABLET (15 MG TOTAL) BY MOUTH DAILY. 07/01/23 10/29/23  Barbaraann Share, DPM  metoprolol succinate (TOPROL-XL) 25 MG 24 hr tablet Take 1 tablet (25 mg total) by mouth daily. 12/19/22   Lennette Bihari, MD  metroNIDAZOLE (METROGEL) 0.75 % gel Apply 1 Application topically daily.    [provider]  Multiple Vitamins-Minerals (WOMENS MULTIVITAMIN PLUS PO) Take 1 tablet by mouth daily.    [provider]  Omega 3 1200 MG CAPS Take 2 capsules by mouth daily.    [provider]  rosuvastatin (CRESTOR) 20 MG  tablet Take 1 tablet (20 mg total) by mouth daily. 10/17/22 10/12/23  Sater, Pearletha Furl, MD  tretinoin (RETIN-A) 0.025 % cream Apply 1 application  topically at bedtime. 06/30/21   [provider]  UNABLE TO FIND Take 2 capsules by mouth at bedtime. Bone Up - calcium supplement    [provider]      Allergies    Other, Penicillins, Gold sodium thiosulfate, and Nickel    Review of Systems   Review of Systems  Respiratory:  Positive for shortness of breath.   All other systems reviewed and are negative.   Physical Exam Updated Vital Signs BP  127/73   Pulse 100   Temp 98.2 F (36.8 C)   Resp 16   Ht 5\' 4"  (1.626 m)   Wt 74.8 kg   LMP 08/01/2007 (Approximate)   SpO2 99%   BMI 28.32 kg/m  Physical Exam Vitals and nursing note reviewed.  Constitutional:      General: She is not in acute distress.    Appearance: Normal appearance.  HENT:     Head: Normocephalic and atraumatic.     Mouth/Throat:     Mouth: Mucous membranes are moist.  Eyes:     General:        Left eye: Discharge present.    Pupils: Pupils are equal, round, and reactive to light.     Comments: Conjunctival injections of the left eye with drainage.  Right right eye is normal.  Cardiovascular:     Rate and Rhythm: Normal rate and regular rhythm.     Pulses: Normal pulses.     Heart sounds: Normal heart sounds.  Pulmonary:     Effort: Pulmonary effort is normal.     Breath sounds: Normal breath sounds.  Abdominal:     Palpations: Abdomen is soft.     Tenderness: There is no abdominal tenderness.  Musculoskeletal:     Cervical back: Normal range of motion.  Skin:    General: Skin is warm and dry.     Findings: No rash.  Neurological:     General: No focal deficit present.     Mental Status: She is alert.  Psychiatric:        Mood and Affect: Mood normal.        Behavior: Behavior normal.    ED Results / Procedures / Treatments   Labs (all labs ordered are listed, but only abnormal results are displayed) Labs Reviewed - No data to display  EKG None  Radiology DG Chest 2 View Result Date: 07/15/2023 CLINICAL DATA:  Cough and shortness of breath for several days. EXAM: CHEST - 2 VIEW COMPARISON:  05/11/2011 FINDINGS: The heart size and mediastinal contours are within normal limits. Both lungs are clear. The visualized skeletal structures are unremarkable. IMPRESSION: No active cardiopulmonary disease. Electronically Signed   By: Danae Orleans M.D.   On: 07/15/2023 17:33    Procedures Procedures: not indicated.   Medications Ordered in  ED Medications  albuterol (VENTOLIN HFA) 108 (90 Base) MCG/ACT inhaler 2 puff (has no administration in time range)  erythromycin ophthalmic ointment (has no administration in time range)    ED Course/ Medical Decision Making/ A&P                                 Medical Decision Making Amount and/or Complexity of Data Reviewed Radiology: ordered.  Risk Prescription drug management.  This patient presents to the ED for concern of cough, this involves an extensive number of treatment options, and is a complaint that carries with it a high risk of complications and morbidity.   Differential diagnosis includes: URI, bronchitis, pneumonia, etc.   Comorbidities  See HPI above   Additional History  Additional history obtained from patient's urgent care records, which she has at bedside.   Cardiac Monitoring / EKG  The patient was maintained on a cardiac monitor.  I personally viewed and interpreted the cardiac monitored which showed: NSR with LBBB, heart rate of 99 bpm.   Imaging Studies  I ordered imaging studies including CXR  I independently visualized and interpreted imaging which showed: No active cardiopulmonary disease. I agree with the radiologist interpretation   Problem List / ED Course / Critical Interventions / Medication Management  Cough I ordered medications including: Erythromycin eye drops for conjunctivitis And albuterol inhaler was ordered in triage.  Patient is not having wheezing at this time but I told her to take the inhaler at home in case she develops shortness of breath and wheezing later on. I have reviewed the patients home medicines and have made adjustments as needed   Social Determinants of Health  Access to healthcare   Test / Admission - Considered  Discussed findings with patient. Patient is hemodynamically stable and safe for discharge home. Return precautions provided.       Final Clinical Impression(s) / ED  Diagnoses Final diagnoses:  Subacute cough  Acute conjunctivitis of left eye, unspecified acute conjunctivitis type    Rx / DC Orders ED Discharge Orders          Ordered    albuterol (VENTOLIN HFA) 108 (90 Base) MCG/ACT inhaler  Every 6 hours PRN        07/15/23 2335    erythromycin ophthalmic ointment        07/15/23 2335              Maxwell Marion, PA-C 07/16/23 0007    Arby Barrette, MD 07/20/23 1331

## 2023-07-28 ENCOUNTER — Other Ambulatory Visit: Payer: Self-pay | Admitting: Neurology

## 2023-07-30 NOTE — Progress Notes (Signed)
 [facility] - Clinic Note  08/08/2023     CHIEF COMPLAINT Patient presents for Retina Follow Up    HISTORY OF PRESENT ILLNESS: Erika Bolton is a 67 y.o. female who presents to the clinic today for:   HPI     Retina Follow Up   Patient presents with  Other.  In both eyes.  Severity is moderate.  Duration of 10 months.  Since onset it is stable.  I, the attending physician,  performed the HPI with the patient and updated documentation appropriately.        Comments   Pt here for 10 mo ret f/u ERM OU. Pt states VA is stable, had a YAG OD Nov 2024. Had hx of YAG OS already-managed by Dr. Waylan. Pt does report using Refresh gtts OU.       Last edited by Valdemar Rogue, MD on 08/08/2023  5:24 PM.    Pt states day to day she doesn't notice any change in her vision, but when she was trying to read the eye chart today, she felt like her vision was blurry, she states when she is using both eyes together her vision seems okay, but when she tries to rely on just her right eye, it's difficult to see, she had Yag OD with Dr. Waylan in November  Referring physician: Waylan Cain, MD [address] [place], [place] [address]  HISTORICAL INFORMATION:   Selected notes from the MEDICAL RECORD NUMBER Referred by Dr. Waylan for ERM OU LEE: 02/15/2021 BCVA OD: 20/30 OS: 20/50 Ocular Hx- history of cryo for retinal tear OD, ERM, cataracts PMH- HTN, anxiety    CURRENT MEDICATIONS: Current Outpatient Medications (Ophthalmic Drugs)  Medication Sig   carboxymethylcellulose (REFRESH PLUS) 0.5 % SOLN Place 1 drop into both eyes daily as needed (for dry eye).   erythromycin  ophthalmic ointment Place a 1/2 inch ribbon of ointment into the lower eyelid 4-6 times a day for the next 7 days   No current facility-administered medications for this visit. (Ophthalmic Drugs)   Current Outpatient Medications (Other)  Medication Sig   albuterol  (VENTOLIN  HFA) 108 (90 Base) MCG/ACT inhaler  Inhale 1-2 puffs into the lungs every 6 (six) hours as needed for wheezing or shortness of breath.   alendronate (FOSAMAX) 70 MG tablet Take 70 mg by mouth once a week. Take with a full glass of water on an empty stomach.   aspirin  EC 81 MG tablet Take 1 tablet (81 mg total) by mouth daily. Swallow whole.   cholecalciferol (VITAMIN D ) 1000 UNITS tablet Take 2,000 Units by mouth daily.   COVID-19 mRNA vaccine 2023-2024 (COMIRNATY ) syringe Inject into the muscle.   cyanocobalamin (VITAMIN B12) 1000 MCG tablet Take 1,000 mcg by mouth every other day.   Estradiol  (YUVAFEM ) 10 MCG TABS vaginal tablet Place 1 tablet (10 mcg total) vaginally 2 (two) times a week.   famotidine  (PEPCID ) 40 MG tablet Take 40 mg by mouth daily.   meloxicam  (MOBIC ) 15 MG tablet TAKE 1 TABLET (15 MG TOTAL) BY MOUTH DAILY.   metoprolol  succinate (TOPROL -XL) 25 MG 24 hr tablet Take 1 tablet (25 mg total) by mouth daily.   metroNIDAZOLE (METROGEL) 0.75 % gel Apply 1 Application topically daily.   Multiple Vitamins-Minerals (WOMENS MULTIVITAMIN PLUS PO) Take 1 tablet by mouth daily.   Omega 3 1200 MG CAPS Take 2 capsules by mouth daily.   rosuvastatin  (CRESTOR ) 20 MG tablet Take 1 tablet (20 mg total) by mouth  daily.   tretinoin (RETIN-A) 0.025 % cream Apply 1 application  topically at bedtime.   UNABLE TO FIND Take 2 capsules by mouth at bedtime. Bone Up - calcium  supplement   losartan  (COZAAR ) 25 MG tablet Take 0.5 tablets (12.5 mg total) by mouth in the morning and at bedtime.   No current facility-administered medications for this visit. (Other)   REVIEW OF SYSTEMS: ROS   Positive for: Gastrointestinal, Eyes Negative for: Constitutional, Neurological, Skin, Genitourinary, Musculoskeletal, HENT, Endocrine, Cardiovascular, Respiratory, Psychiatric, Allergic/Imm, Heme/Lymph Last edited by Antonetta Almetta BRAVO, COT on 08/08/2023 12:47 PM.     ALLERGIES Allergies  Allergen Reactions   Other Other (See Comments)     Potassium dichromate, broke out in dry rashes   Penicillins Swelling    Throat swelling - hasn't had since was a teen   Gold Sodium Thiosulfate Rash   Nickel Rash   PAST MEDICAL HISTORY Past Medical History:  Diagnosis Date   Colon polyps 2003   Detached retina    Hypertension    Hypertensive retinopathy    Kidney stone 2016   Left bundle branch block 2012   Menorrhagia    MGUS (monoclonal gammopathy of unknown significance)    Plantar fasciitis    Precancerous lesion 1987   mole excised from vulva   PVD (posterior vitreous detachment), right 09/07/2015   TIA (transient ischemic attack) 2024   Past Surgical History:  Procedure Laterality Date   ABLATION  11/10/2002   Hysteroscopic Thermal   CATARACT EXTRACTION     cyst on scalp     sebaccous(multiple times as a teen)   EYE SURGERY     gastro surgery  07/31/2010   LAPAROSCOPIC CHOLECYSTECTOMY  11/28/2001   left leg muscle surgery  07/31/2010   OTHER SURGICAL HISTORY     Sebaceous cyst on head removed 7x's   RETINAL DETACHMENT SURGERY      FAMILY HISTORY Family History  Problem Relation Age of Onset   Colon cancer Father 53   Alzheimer's disease Father    Emphysema Father    COPD Father    Heart attack Mother    Cancer Brother        peritod gland cancer   Breast cancer Maternal Aunt        post menopause   SOCIAL HISTORY Social History   Tobacco Use   Smoking status: Never   Smokeless tobacco: Never  Vaping Use   Vaping status: Never Used  Substance Use Topics   Alcohol use: Yes    Alcohol/week: 3.0 standard drinks of alcohol    Types: 3 Glasses of wine per week   Drug use: No       OPHTHALMIC EXAM: Base Eye Exam     Visual Acuity (Snellen - Linear)       Right Left   Dist [place] 20/40 20/20   Dist ph [place] 20/30   Wears CTL OD typically for reading, had pt take out to check DVA. MS        Tonometry (Tonopen, 12:55 PM)       Right Left   Pressure 9 10         Pupils       Pupils Dark  Light Shape React APD   Right PERRL 4 3 Round Brisk None   Left PERRL 4 3 Round Brisk None         Visual Fields       Left Right    Full Full  Extraocular Movement       Right Left    Full, Ortho Full, Ortho         Neuro/Psych     Oriented x3: Yes   Mood/Affect: Normal         Dilation     Both eyes: 1.0% Mydriacyl, 2.5% Phenylephrine @ 12:56 PM           Slit Lamp and Fundus Exam     Slit Lamp Exam       Right Left   Lids/Lashes Dermatochalasis - upper lid, mild MGD Dermatochalasis - upper lid   Conjunctiva/Sclera White and quiet White and quiet   Cornea Arcus, well healed temporal cataract wound, 1+ focal Punctate epithelial erosions inferiorly, mild tear film debris Arcus, well healed cataract wound, trace PEE, trace tear film debris   Anterior Chamber deep and clear deep and clear   Iris Round and dilated Round and dilated   Lens PC IOL in good position with open PC PC IOL in good position, 1+ Posterior capsular opacification   Anterior Vitreous Vitreous syneresis, Posterior vitreous detachment, Vitreous condensations, vitreous debris Vitreous syneresis, Posterior vitreous detachment, Weiss ring, vitreous condensations         Fundus Exam       Right Left   Disc mild pallor, sharp rim, tilted, temp PPA/PPP Pink and sharp, tilted, temp PPA   C/D Ratio 0.3 0.5   Macula Flat, Blunted foveal reflex, ERM with striae greatest temporal to fovea, focal point of fibrosis temporal to fovea, trace cystic changes centrally Flat, Good foveal reflex, RPE mottling and clumping, mild ERM, no heme or edema   Vessels attenuated, mild tortuosity attenuated, mild tortuosity   Periphery Attached, Good cryo changes 1100, Inferior pavingstone, no new RT/RD Attached, pigmented pavingstone degeneration inferiorly, No RT/RD           IMAGING AND PROCEDURES  Imaging and Procedures for 08/08/2023  OCT, Retina - OU - Both Eyes       Right Eye Quality was  good. Central Foveal Thickness: 304. Progression has been stable. Findings include no IRF, no SRF, abnormal foveal contour, myopic contour, epiretinal membrane, macular pucker (ERM with pucker and peri-foveal thickening greatest temporal fovea; myopic contour; trace cystic changes ST fovea).   Left Eye Quality was good. Central Foveal Thickness: 271. Progression has been stable. Findings include normal foveal contour, no IRF, no SRF, myopic contour, epiretinal membrane, macular pucker (Mild ERM/pucker superior macula).   Notes *Images captured and stored on drive  Diagnosis / Impression:  ERM OU (OD>OS) Myopic contour OU OD: ERM with pucker and peri-foveal thickening greatest temporal fovea; trace cystic changes ST fovea OS: Mild ERM/pucker superior macula  Clinical management:  See below  Abbreviations: NFP - Normal foveal profile. CME - cystoid macular edema. PED - pigment epithelial detachment. IRF - intraretinal fluid. SRF - subretinal fluid. EZ - ellipsoid zone. ERM - epiretinal membrane. ORA - outer retinal atrophy. ORT - outer retinal tubulation. SRHM - subretinal hyper-reflective material. IRHM - intraretinal hyper-reflective material             ASSESSMENT/PLAN:    ICD-10-CM   1. Epiretinal membrane (ERM) of both eyes  H35.373 OCT, Retina - OU - Both Eyes    2. History of retinal detachment  Z86.69     3. Essential hypertension  I10     4. Hypertensive retinopathy of both eyes  H35.033     5. Pseudophakia of both eyes  Z96.1  1. Epiretinal membrane, OU (OD>OS) - BCVA OD 20/30 from 20/20; OS 20/20 - stablre - mild metamorphopsia improved OD - OCT OD shows ERM with pucker and peri-foveal thickening greatest temporal fovea; trace cystic changes ST fovea; OS w/ mild ERM/pucker superior macula - no indication for surgery at this time, but will monitor a little more closely - f/u 6 mos, sooner prn-- DFE/OCT   2. History of retinal detachment OD  - s/p pneumatic  retinopexy OD 02.24.18 at [facility] w/ Dr. Mindi  - HST at 1100 -- good cryo  - retina stably reattached, no RT/RD  3,4. Hypertensive retinopathy OU - discussed importance of tight BP control - monitor  5. Pseudophakia OU  - s/p CE/IOL OU (Dr. Waylan - mid-September '22)  - OD with CTR -- no pseudophakodonesis  - IOLs in good position, doing well  - monitor  Ophthalmic Meds Ordered this visit:  No orders of the defined types were placed in this encounter.    Return in about 6 months (around 02/05/2024) for f/u ERM OU, DFE, OCT.  There are no Patient Instructions on file for this visit.  Explained the diagnoses, plan, and follow up with the patient and they expressed understanding.  Patient expressed understanding of the importance of proper follow up care.   This document serves as a record of services personally performed by Redell JUDITHANN Hans, MD, PhD. It was created on their behalf by Almetta Pesa, an ophthalmic technician. The creation of this record is the provider's dictation and/or activities during the visit.    Electronically signed by: Almetta Pesa, OA, 08/08/23  5:25 PM  This document serves as a record of services personally performed by Redell JUDITHANN Hans, MD, PhD. It was created on their behalf by Alan PARAS. Delores, OA an ophthalmic technician. The creation of this record is the provider's dictation and/or activities during the visit.    Electronically signed by: Alan PARAS. Delores, OA 08/08/23 5:25 PM  Redell JUDITHANN Hans, M.D., Ph.D. Diseases & Surgery of the Retina and Vitreous [facility]  I have reviewed the above documentation for accuracy and completeness, and I agree with the above. Redell JUDITHANN Hans, M.D., Ph.D. 08/08/23 5:27 PM   Abbreviations: M myopia (nearsighted); A astigmatism; H hyperopia (farsighted); P presbyopia; Mrx spectacle prescription;  CTL contact lenses; OD right eye; OS left eye; OU both eyes  XT exotropia; ET esotropia; PEK  punctate epithelial keratitis; PEE punctate epithelial erosions; DES dry eye syndrome; MGD meibomian gland dysfunction; ATs artificial tears; PFAT's preservative free artificial tears; NSC nuclear sclerotic cataract; PSC posterior subcapsular cataract; ERM epi-retinal membrane; PVD posterior vitreous detachment; RD retinal detachment; DM diabetes mellitus; DR diabetic retinopathy; NPDR non-proliferative diabetic retinopathy; PDR proliferative diabetic retinopathy; CSME clinically significant macular edema; DME diabetic macular edema; dbh dot blot hemorrhages; CWS cotton wool spot; POAG primary open angle glaucoma; C/D cup-to-disc ratio; HVF humphrey visual field; GVF goldmann visual field; OCT optical coherence tomography; IOP intraocular pressure; BRVO Branch retinal vein occlusion; CRVO central retinal vein occlusion; CRAO central retinal artery occlusion; BRAO branch retinal artery occlusion; RT retinal tear; SB scleral buckle; PPV pars plana vitrectomy; VH Vitreous hemorrhage; PRP panretinal laser photocoagulation; IVK intravitreal kenalog; VMT vitreomacular traction; MH Macular hole;  NVD neovascularization of the disc; NVE neovascularization elsewhere; AREDS age related eye disease study; ARMD age related macular degeneration; POAG primary open angle glaucoma; EBMD epithelial/anterior basement membrane dystrophy; ACIOL anterior chamber intraocular lens; IOL intraocular lens; PCIOL posterior chamber intraocular  lens; Phaco/IOL phacoemulsification with intraocular lens placement; PRK photorefractive keratectomy; LASIK laser assisted in situ keratomileusis; HTN hypertension; DM diabetes mellitus; COPD chronic obstructive pulmonary disease

## 2023-07-30 NOTE — Telephone Encounter (Signed)
Last seen on 05/02/23 No follow up scheduled  Rx filled on 06/20/23 #90 day supply, refill should be good until Feb 2025

## 2023-08-08 ENCOUNTER — Encounter (INDEPENDENT_AMBULATORY_CARE_PROVIDER_SITE_OTHER): Payer: Self-pay | Admitting: Ophthalmology

## 2023-08-08 ENCOUNTER — Ambulatory Visit (INDEPENDENT_AMBULATORY_CARE_PROVIDER_SITE_OTHER): Payer: Medicare Other | Admitting: Ophthalmology

## 2023-08-08 DIAGNOSIS — H35373 Puckering of macula, bilateral: Secondary | ICD-10-CM

## 2023-08-08 DIAGNOSIS — Z8669 Personal history of other diseases of the nervous system and sense organs: Secondary | ICD-10-CM | POA: Diagnosis not present

## 2023-08-08 DIAGNOSIS — I10 Essential (primary) hypertension: Secondary | ICD-10-CM

## 2023-08-08 DIAGNOSIS — Z961 Presence of intraocular lens: Secondary | ICD-10-CM

## 2023-08-08 DIAGNOSIS — H35033 Hypertensive retinopathy, bilateral: Secondary | ICD-10-CM | POA: Diagnosis not present

## 2023-08-13 ENCOUNTER — Telehealth: Payer: Self-pay | Admitting: Cardiovascular Disease

## 2023-08-13 MED ORDER — ROSUVASTATIN CALCIUM 20 MG PO TABS: 20.0000 mg | ORAL_TABLET | Freq: Every day | ORAL | 2 refills | Status: DC

## 2023-08-13 NOTE — Telephone Encounter (Signed)
*  STAT* If patient is at the pharmacy, call can be transferred to refill team.     1. Which medications need to be refilled? (please list name of each medication and dose if known) metoprolol  succinate (TOPROL -XL) 25 MG 24 hr tablet        2. Would you like to learn more about the convenience and safety   3. Are you open to using the [facility] (Type [facility].     4. Which pharmacy/location (including street and city if local pharmacy) is medication to be sent to? [facility] - [place], [place] - [address]      5. Do they need a 30 day or 90 day supply? 90

## 2023-08-13 NOTE — Telephone Encounter (Signed)
*  STAT* If patient is at the pharmacy, call can be transferred to refill team.   1. Which medications need to be refilled? (please list name of each medication and dose if known) new prescription for Rosuvastatin    2. Would you like to learn more about the convenience, safety, & potential cost savings by using the [facility]?    3. Are you open to using the [facility] (Type [facility]. .   4. Which pharmacy/location (including street and city if local pharmacy) is medication to be sent to?[facility]   5. Do they need a 30 day or 90 day supply? 90 days and refills

## 2023-08-13 NOTE — Telephone Encounter (Signed)
 disregard

## 2023-08-22 ENCOUNTER — Other Ambulatory Visit (HOSPITAL_BASED_OUTPATIENT_CLINIC_OR_DEPARTMENT_OTHER): Payer: Self-pay

## 2023-09-11 ENCOUNTER — Encounter: Payer: Self-pay | Admitting: Obstetrics and Gynecology

## 2023-09-13 ENCOUNTER — Encounter: Payer: Self-pay | Admitting: Obstetrics and Gynecology

## 2023-09-15 ENCOUNTER — Encounter: Payer: Self-pay | Admitting: Obstetrics and Gynecology

## 2023-10-02 ENCOUNTER — Other Ambulatory Visit (HOSPITAL_BASED_OUTPATIENT_CLINIC_OR_DEPARTMENT_OTHER): Payer: Self-pay

## 2023-10-02 MED ORDER — AREXVY 120 MCG/0.5ML IM SUSR
INTRAMUSCULAR | 0 refills | Status: DC
  Filled 2023-10-02: qty 0.5, 1d supply, fill #0

## 2023-10-17 ENCOUNTER — Other Ambulatory Visit (HOSPITAL_BASED_OUTPATIENT_CLINIC_OR_DEPARTMENT_OTHER): Payer: Self-pay

## 2023-10-17 MED ORDER — CAPVAXIVE 0.5 ML IM SOSY
0.5000 mL | PREFILLED_SYRINGE | Freq: Once | INTRAMUSCULAR | 0 refills | Status: AC
  Filled 2023-10-17: qty 0.5, 1d supply, fill #0

## 2023-10-29 ENCOUNTER — Ambulatory Visit (INDEPENDENT_AMBULATORY_CARE_PROVIDER_SITE_OTHER): Admitting: Podiatry

## 2023-10-29 ENCOUNTER — Encounter: Payer: Self-pay | Admitting: Podiatry

## 2023-10-29 DIAGNOSIS — M722 Plantar fascial fibromatosis: Secondary | ICD-10-CM | POA: Diagnosis not present

## 2023-10-29 MED ORDER — MELOXICAM 15 MG PO TABS: 15.0000 mg | ORAL_TABLET | Freq: Every day | ORAL | 1 refills | Status: DC

## 2023-10-29 NOTE — Progress Notes (Signed)
 Chief Complaint  Patient presents with   Foot Pain    Follow up PF/orthotic check   "He wanted me to return after wearing the orthotics awhile. They seem to be okay, but I just don't want to have to wear sneakers all the time. It has made the outside pf the right foot hurt some, but I used some ice and that has helped, so much better now"    Subjective: 68 y.o. female presenting today for follow-up evaluation of plantar fasciitis bilateral.  Patient states that she is doing significantly better.  She says the orthotics helped tremendously.  PSxHx Beverlyn Roux gastroc aponeurosis lengthening LLE to address her plantar fasciitis about 10-15 years ago   Past Medical History:  Diagnosis Date   Colon polyps 2003   Detached retina    Hypertension    Hypertensive retinopathy    Kidney stone 2016   Left bundle branch block 2012   Menorrhagia    MGUS (monoclonal gammopathy of unknown significance)    Plantar fasciitis    Precancerous lesion 1987   mole excised from vulva   PVD (posterior vitreous detachment), right 09/07/2015   TIA (transient ischemic attack) 2024   Past Surgical History:  Procedure Laterality Date   ABLATION  11/10/2002   Hysteroscopic Thermal   CATARACT EXTRACTION     cyst on scalp     sebaccous(multiple times as a teen)   EYE SURGERY     gastro surgery  07/31/2010   LAPAROSCOPIC CHOLECYSTECTOMY  11/28/2001   left leg muscle surgery  07/31/2010   OTHER SURGICAL HISTORY     Sebaceous cyst on head removed 7x's   RETINAL DETACHMENT SURGERY     Allergies  Allergen Reactions   Other Other (See Comments)    Potassium dichromate, broke out in dry rashes   Penicillins Swelling    Throat swelling - hasn't had since was a teen   Gold Sodium Thiosulfate Rash   Nickel Rash     Objective: Physical Exam General: The patient is alert and oriented x3 in no acute distress.  Dermatology: Skin is warm, dry and supple bilateral lower extremities. Negative for open  lesions or macerations bilateral.   Vascular: Dorsalis Pedis and Posterior Tibial pulses palpable bilateral.  Capillary fill time is immediate to all digits.  Neurological: Grossly intact via light touch  Musculoskeletal: Negative for any appreciable tenderness to palpation to the plantar aspect of the bilateral heel along the plantar fascia. All other joints range of motion within normal limits bilateral. Strength 5/5 in all groups bilateral.   Radiographic exam LT foot 12/18/2022: Normal osseous mineralization. Joint spaces preserved. No fracture/dislocation/boney destruction. No other soft tissue abnormalities or radiopaque foreign bodies.  Os peroneum noted  Assessment: 1. Plantar fasciitis bilateral 2.  History of endoscopic plantar fasciotomy left x 10 years ago  Plan of Care:  -Patient evaluated.    -Continue custom molded orthotics. They seem to be helping significantly.  -The patient was not wearing her custom orthotics recommend good supportive sandals. -Return to clinic as needed  Felecia Shelling, DPM [facility]  Dr. Felecia Shelling, DPM    [address], [place] [address]  Office [phone]  Fax [phone]

## 2023-11-21 ENCOUNTER — Ambulatory Visit: Payer: Medicare Other | Attending: Cardiovascular Disease | Admitting: Cardiovascular Disease

## 2023-11-21 ENCOUNTER — Encounter: Payer: Self-pay | Admitting: Cardiovascular Disease

## 2023-11-21 VITALS — BP 120/72 | HR 73 | Ht 64.0 in | Wt 169.0 lb

## 2023-11-21 DIAGNOSIS — I428 Other cardiomyopathies: Secondary | ICD-10-CM

## 2023-11-21 DIAGNOSIS — I447 Left bundle-branch block, unspecified: Secondary | ICD-10-CM | POA: Diagnosis not present

## 2023-11-21 DIAGNOSIS — Z8673 Personal history of transient ischemic attack (TIA), and cerebral infarction without residual deficits: Secondary | ICD-10-CM | POA: Diagnosis not present

## 2023-11-21 DIAGNOSIS — I10 Essential (primary) hypertension: Secondary | ICD-10-CM | POA: Diagnosis not present

## 2023-11-21 DIAGNOSIS — E785 Hyperlipidemia, unspecified: Secondary | ICD-10-CM

## 2023-11-21 MED ORDER — ROSUVASTATIN CALCIUM 20 MG PO TABS: 20.0000 mg | ORAL_TABLET | Freq: Every day | ORAL | 2 refills | Status: DC

## 2023-11-21 MED ORDER — OLMESARTAN MEDOXOMIL 20 MG PO TABS: 20.0000 mg | ORAL_TABLET | Freq: Every day | ORAL | 3 refills | Status: AC

## 2023-11-21 MED ORDER — METOPROLOL SUCCINATE ER 25 MG PO TB24: 25.0000 mg | ORAL_TABLET | Freq: Every day | ORAL | 3 refills | Status: AC

## 2023-11-21 NOTE — Progress Notes (Unsigned)
 Cardiology Office Note    Date:  11/21/2023   ID:  Yoali, Conry 1956-04-01, MRN [id number]  PCP:  Tena Feeling, MD  Cardiologist:  Magnus Schuller, MD   Initial office visit with me following her recent hospitalization from March 3 to October 03, 2022.   History of Present Illness:  Erika Bolton is a 68 y.o. female who had undergone cardiac catheterization in 2012 in the setting of left bundle branch block and an abnormal nuclear study.  Catheterization revealed normal coronary arteries.  An echo Doppler study was consistent with septal wall abnormality consistent with left bundle branch block.  Patient had done well until October 01, 2022 when she presented to the hospital with right-sided numbness, right-sided facial numbness, elevated blood pressure in the setting of TIA.  Head CT was negative for acute findings.  There was no evidence of large vessel occlusion on CTA of the head and neck.  An echo Doppler study revealed EF at 35% with hypokinesis of the inferior wall and apex, mild LVH, grade 1 diastolic dysfunction, normal RV pressure.  She also underwent coronary CTA which was negative for CAD.  Calcium  score was 0.  She was seen by me during her hospitalization.  Guideline directed medical therapy was limited in the setting of lower blood pressure and recent TIA.  She was on metoprolol  12.5 twice a day and losartan  12.5 mg.  She was seen by Dr. Christiane Cowing of neurology and it was recommended that she be on aspirin  and Plavix  for 3 weeks and then aspirin  alone at 81 mg.  She was subsequently seen by Marlana Silvan, NP on October 16, 2022 and was euvolemic and well compensated on exam.  Subsequently, she saw Dr.Sater of neurology as an outpatient on October 17, 2022.  It was recommended that she initiate rosuvastatin  20 mg, use oral appliance for OSA, and have nerve conduction velocity/EMG evaluation.  Presently, Erika Bolton feels well.  She underwent a follow-up echo Doppler study on Dec 11, 2022.  This now  shows improvement in LV function with EF now at 50 to 55% (by 3D volume at 50%).  There were no wall motion abnormalities.  There was grade 1 diastolic dysfunction.  RV systolic function was normal.  Valves were normal.  She states her blood pressure last week was running 1 20-1 40 systolically.  Presently she is on losartan  at bedtime 12.5 mg daily, metoprolol  succinate 25 mg and rosuvastatin  20 mg in addition to baby aspirin .  She takes Pepcid  as needed for dyspepsia.  She presents for evaluation.    Past Medical History:  Diagnosis Date   Colon polyps 2003   Detached retina    Hypertension    Hypertensive retinopathy    Kidney stone 2016   Left bundle branch block 2012   Menorrhagia    MGUS (monoclonal gammopathy of unknown significance)    Plantar fasciitis    Precancerous lesion 1987   mole excised from vulva   PVD (posterior vitreous detachment), right 09/07/2015   TIA (transient ischemic attack) 2024    Past Surgical History:  Procedure Laterality Date   ABLATION  11/10/2002   Hysteroscopic Thermal   CATARACT EXTRACTION     cyst on scalp     sebaccous(multiple times as a teen)   EYE SURGERY     gastro surgery  07/31/2010   LAPAROSCOPIC CHOLECYSTECTOMY  11/28/2001   left leg muscle surgery  07/31/2010   OTHER SURGICAL HISTORY  Sebaceous cyst on head removed 7x's   RETINAL DETACHMENT SURGERY      Current Medications: Outpatient Medications Prior to Visit  Medication Sig Dispense Refill   alendronate (FOSAMAX) 70 MG tablet Take 70 mg by mouth once a week. Take with a full glass of water on an empty stomach.     carboxymethylcellulose (REFRESH PLUS) 0.5 % SOLN Place 1 drop into both eyes daily as needed (for dry eye).     cholecalciferol (VITAMIN D ) 1000 UNITS tablet Take 2,000 Units by mouth daily.     cyanocobalamin (VITAMIN B12) 1000 MCG tablet Take 1,000 mcg by mouth every other day.     famotidine  (PEPCID ) 40 MG tablet Take 40 mg by mouth daily.     metoprolol   succinate (TOPROL -XL) 25 MG 24 hr tablet Take 1 tablet (25 mg total) by mouth daily. 90 tablet 3   metroNIDAZOLE (METROGEL) 0.75 % gel Apply 1 Application topically daily.     Multiple Vitamins-Minerals (WOMENS MULTIVITAMIN PLUS PO) Take 1 tablet by mouth daily.     olmesartan  (BENICAR ) 20 MG tablet Take 20 mg by mouth daily.     Omega 3 1200 MG CAPS Take 2 capsules by mouth daily.     rosuvastatin  (CRESTOR ) 20 MG tablet Take 1 tablet (20 mg total) by mouth daily. 90 tablet 2   scopolamine (TRANSDERM-SCOP) 1 MG/3DAYS SMARTSIG:1 Patch(s) Topical PRN     tretinoin (RETIN-A) 0.025 % cream Apply 1 application  topically at bedtime.     UNABLE TO FIND Take 2 capsules by mouth at bedtime. Bone Up - calcium  supplement     albuterol  (VENTOLIN  HFA) 108 (90 Base) MCG/ACT inhaler Inhale 1-2 puffs into the lungs every 6 (six) hours as needed for wheezing or shortness of breath. 8 g 0   COVID-19 mRNA vaccine 2023-2024 (COMIRNATY ) syringe Inject into the muscle. (Patient not taking: Reported on 11/21/2023) 0.3 mL 0   erythromycin  ophthalmic ointment Place a 1/2 inch ribbon of ointment into the lower eyelid 4-6 times a day for the next 7 days (Patient not taking: Reported on 11/21/2023) 3.5 g 0   Estradiol  (YUVAFEM ) 10 MCG TABS vaginal tablet Place 1 tablet (10 mcg total) vaginally 2 (two) times a week. (Patient not taking: Reported on 11/21/2023) 24 tablet 0   losartan  (COZAAR ) 25 MG tablet Take 0.5 tablets (12.5 mg total) by mouth in the morning and at bedtime. 90 tablet 3   meloxicam  (MOBIC ) 15 MG tablet Take 1 tablet (15 mg total) by mouth daily. (Patient not taking: Reported on 11/21/2023) 60 tablet 1   RSV vaccine recomb adjuvanted (AREXVY ) 120 MCG/0.5ML injection Inject into the muscle. (Patient not taking: Reported on 11/21/2023) 0.5 mL 0   No facility-administered medications prior to visit.     Allergies:   Other, Penicillins, Gold sodium thiosulfate, and Nickel   Social History   Socioeconomic History    Marital status: Married    Spouse name: Not on file   Number of children: 2   Years of education: M.ED.   Highest education level: Not on file  Occupational History    Comment: [employer]  Tobacco Use   Smoking status: Never   Smokeless tobacco: Never  Vaping Use   Vaping status: Never Used  Substance and Sexual Activity   Alcohol use: Yes    Alcohol/week: 3.0 standard drinks of alcohol    Types: 3 Glasses of wine per week   Drug use: No   Sexual activity: Not Currently  Partners: Male    Birth control/protection: Post-menopausal    Comment: has had abnormal pap in the 2023  Other Topics Concern   Not on file  Social History Narrative   2 cups of coffee a day    Social Drivers of Corporate investment banker Strain: Not on file  Food Insecurity: Not on file  Transportation Needs: Not on file  Physical Activity: Not on file  Stress: Not on file  Social Connections: Not on file     Family History:  The patient's family history includes Alzheimer's disease in her father; Breast cancer in her maternal aunt; COPD in her father; Cancer in her brother; Colon cancer (age of onset: 80) in her father; Emphysema in her father; Heart attack in her mother.   ROS General: Negative; No fevers, chills, or night sweats;  HEENT: Negative; No changes in vision or hearing, sinus congestion, difficulty swallowing Pulmonary: Negative; No cough, wheezing, shortness of breath, hemoptysis Cardiovascular: Negative; No chest pain, presyncope, syncope, palpitations GI: Negative; No nausea, vomiting, diarrhea, or abdominal pain GU: Negative; No dysuria, hematuria, or difficulty voiding Musculoskeletal: Negative; no myalgias, joint pain, or weakness Hematologic/Oncology: Negative; no easy bruising, bleeding Endocrine: Negative; no heat/cold intolerance; no diabetes Neuro: Recent TIA Skin: Negative; No rashes or skin lesions Psychiatric: Negative; No behavioral problems,  depression Sleep: Negative; No snoring, daytime sleepiness, hypersomnolence, bruxism, restless legs, hypnogognic hallucinations, no cataplexy Other comprehensive 14 point system review is negative.   PHYSICAL EXAM:   VS:  BP 120/72 (BP Location: Left Arm, Patient Position: Sitting, Cuff Size: Normal)   Pulse 73   Ht 5\' 4"  (1.626 m)   Wt 169 lb (76.7 kg)   LMP 08/01/2007 (Approximate)   SpO2 95%   BMI 29.01 kg/m     Repeat blood pressure by me 126/80  Wt Readings from Last 3 Encounters:  11/21/23 169 lb (76.7 kg)  07/15/23 165 lb (74.8 kg)  05/23/23 172 lb (78 kg)    General: Alert, oriented, no distress.  Skin: normal turgor, no rashes, warm and dry HEENT: Normocephalic, atraumatic. Pupils equal round and reactive to light; sclera anicteric; extraocular muscles intact;  Nose without nasal septal hypertrophy Mouth/Parynx benign; Mallinpatti scale 3 Neck: No JVD, no carotid bruits; normal carotid upstroke Lungs: clear to ausculatation and percussion; no wheezing or rales Chest wall: without tenderness to palpitation Heart: PMI not displaced, RRR, s1 s2 normal, 1/6 systolic murmur, no diastolic murmur, no rubs, gallops, thrills, or heaves Abdomen: soft, nontender; no hepatosplenomehaly, BS+; abdominal aorta nontender and not dilated by palpation. Back: no CVA tenderness Pulses 2+ Musculoskeletal: full range of motion, normal strength, no joint deformities Extremities: no clubbing cyanosis or edema, Homan's sign negative  Neurologic: grossly nonfocal; Cranial nerves grossly wnl Psychologic: Normal mood and affect   Studies/Labs Reviewed:   Dec 19, 2022 ECG (independently read by me): NSR at 81, LBBB, QTc 476 msec  Recent Labs:    Latest Ref Rng & Units 05/18/2023    9:06 AM 01/18/2023    9:50 AM 10/01/2022    5:00 PM  BMP  Glucose 70 - 99 mg/dL 409  811  914   BUN 8 - 27 mg/dL 18  14  17    Creatinine 0.57 - 1.00 mg/dL 7.82  9.56  2.13   BUN/Creat Ratio 12 - 28 26      Sodium 134 - 144 mmol/L 142  136  141   Potassium 3.5 - 5.2 mmol/L 4.7  4.3  3.6  Chloride 96 - 106 mmol/L 106  104  105   CO2 20 - 29 mmol/L 24  28  26    Calcium  8.7 - 10.3 mg/dL 40.9  81.1  91.4         Latest Ref Rng & Units 05/18/2023    9:06 AM 01/18/2023    9:50 AM 12/04/2022    9:18 AM  Hepatic Function  Total Protein 6.0 - 8.5 g/dL 6.8  6.8  7.1   Albumin 3.9 - 4.9 g/dL 4.6  4.1  4.6   AST 0 - 40 IU/L 19  22  24    ALT 0 - 32 IU/L 21  21  21    Alk Phosphatase 44 - 121 IU/L 77  70  81   Total Bilirubin 0.0 - 1.2 mg/dL 0.5  0.7  0.5   Bilirubin, Direct 0.00 - 0.40 mg/dL   7.82        Latest Ref Rng & Units 01/18/2023    9:50 AM 10/01/2022    5:00 PM 01/10/2022   11:31 AM  CBC  WBC 4.0 - 10.5 K/uL 5.0  8.0  5.6   Hemoglobin 12.0 - 15.0 g/dL 95.6  21.3  08.6   Hematocrit 36.0 - 46.0 % 40.8  42.2  43.7   Platelets 150 - 400 K/uL 240  297  300    Lab Results  Component Value Date   MCV 90.7 01/18/2023   MCV 87.4 10/01/2022   MCV 86.5 01/10/2022   No results found for: "TSH" Lab Results  Component Value Date   HGBA1C 5.6 10/02/2022     BNP No results found for: "BNP"  ProBNP No results found for: "PROBNP"   Lipid Panel     Component Value Date/Time   CHOL 131 05/18/2023 0906   TRIG 96 05/18/2023 0906   HDL 64 05/18/2023 0906   CHOLHDL 2.0 05/18/2023 0906   CHOLHDL 3.0 10/02/2022 0245   VLDL 14 10/02/2022 0245   LDLCALC 49 05/18/2023 0906   LABVLDL 18 05/18/2023 0906     RADIOLOGY: No results found.   Additional studies/ records that were reviewed today include:   ECHO 10/02/22  1. Difficult acoustic windows. Hypokkinesis of the mid/distal septal  wall, distal lateral. Hypokinesis of the inferior wall and apex. . Left  ventricular ejection fraction, by estimation, is 35%. The left ventricle  has severely decreased function. There  is mild left ventricular hypertrophy. Left ventricular diastolic  parameters are consistent with Grade I diastolic  dysfunction (impaired  relaxation).   2. Right ventricular systolic function is normal. The right ventricular  size is normal.   3. Trivial mitral valve regurgitation.   4. The aortic valve is tricuspid. Aortic valve regurgitation is not  visualized.   5. The inferior vena cava is normal in size with greater than 50%  respiratory variability, suggesting right atrial pressure of 3 mmHg.     CCTA 09/2022:  1. Coronary calcium  score of 0.   2. Normal coronary origin with right dominance.   3. No evidence of CAD.  Non ischemic cardiomyopathy.   CAD-RADS 0.  No evidence of CAD (0%).   ECHO: 12/11/2022  1. Left ventricular ejection fraction, by estimation, is 50 to 55%. Left  ventricular ejection fraction by 3D volume is 50 %. The left ventricle has  low normal function. The left ventricle has no regional wall motion  abnormalities. Left ventricular  diastolic parameters are consistent with Grade I diastolic dysfunction  (impaired relaxation).  2. Right ventricular systolic function is normal. The right ventricular  size is normal. Tricuspid regurgitation signal is inadequate for assessing  PA pressure.   3. The mitral valve is normal in structure. No evidence of mitral valve  regurgitation. No evidence of mitral stenosis.   4. The aortic valve is normal in structure. Aortic valve regurgitation is  not visualized. No aortic stenosis is present.   5. The inferior vena cava is normal in size with greater than 50%  respiratory variability, suggesting right atrial pressure of 3 mmHg.     ASSESSMENT:    1. Primary hypertension     PLAN:  Ms. Cathaleen Korol "Ivin Marrow" is a 68 year old female who had previously undergone cardiac catheterization in 2012 which showed normal coronary arteries.  She has a history of left bundle branch block, hypertension, hyperlipidemia, and suffered a TIA leading to hospitalization on October 01, 2022.  She presented with right sided numbness, elevated BP in  the setting of her TIA.  I reviewed the findings of her hospitalization.  An echo Doppler study initially showed EF estimated at 35% with mild LVH and grade 1 diastolic dysfunction.  Coronary CTA had calcium  score of 0.  She was felt to have a nonischemic cardiomyopathy.  And was treated with aspirin /Plavix  for 3 weeks and then changed to just aspirin  alone.  She also was treated with low-dose losartan  12.5 mg in addition to metoprolol  succinate 25 mg daily.  A 5-month follow-up echo Doppler study was done on Dec 11, 2022 chart reviewed with her today in detail.  This now shows improvement in LV function with EF estimated at 50 to 55%.  She did not have any wall motion abnormalities and had grade 1 diastolic dysfunction.  Valves were essentially normal.  Presently, her blood pressure is stable but she states last week her blood pressure is typically running in the 120s to 140.  As result, I will slightly titrate her losartan  dose to 12.5 mg twice a day.  She is now on rosuvastatin  20 mg per lipidemia target LDL less than 70 and is on aspirin  81 mg.  Laboratory on Dec 04, 2022 shows total cholesterol 140, HDL 71, LDL 51, and triglycerides 010.  Hemoglobin A1c on October 01, 2020 was normal at 5.6.  She will continue current therapy.  She will follow-up with Dr. Marton Sleeper as well as Dr. Godwin Lat.  I will see her in 6 months for cardiology reevaluation.   Medication Adjustments/Labs and Tests Ordered: Current medicines are reviewed at length with the patient today.  Concerns regarding medicines are outlined above.  Medication changes, Labs and Tests ordered today are listed in the Patient Instructions below. There are no Patient Instructions on file for this visit.   Signed, Magnus Schuller, MD  11/21/2023 2:23 PM    [facility] [address], [place], [place]  [address] Phone: [phone]

## 2023-11-21 NOTE — Patient Instructions (Signed)
 Medication Instructions:  No changes *If you need a refill on your cardiac medications before your next appointment, please call your pharmacy*  Lab Work: No labs If you have labs (blood work) drawn today and your tests are completely normal, you will receive your results only by: MyChart Message (if you have MyChart) OR A paper copy in the mail If you have any lab test that is abnormal or we need to change your treatment, we will call you to review the results.  Testing/Procedures: No testing  Follow-Up: At [facility], you and your health needs are our priority.  As part of our continuing mission to provide you with exceptional heart care, our providers are all part of one team.  This team includes your primary Cardiologist (physician) and Advanced Practice Providers or APPs (Physician Assistants and Nurse Practitioners) who all work together to provide you with the care you need, when you need it.  Your next appointment:   9 months  Provider:   Sheryle Donning MD  We recommend signing up for the patient portal called "MyChart".  Sign up information is provided on this After Visit Summary.  MyChart is used to connect with patients for Virtual Visits (Telemedicine).  Patients are able to view lab/test results, encounter notes, upcoming appointments, etc.  Non-urgent messages can be sent to your provider as well.   To learn more about what you can do with MyChart, go to [email or web address].   Other Instructions   1st Floor: - Lobby - Registration  - Pharmacy  - Lab - Cafe  2nd Floor: - PV Lab - Diagnostic Testing (echo, CT, nuclear med)  3rd Floor: - Vacant  4th Floor: - TCTS (cardiothoracic surgery) - AFib Clinic - Structural Heart Clinic - Vascular Surgery  - Vascular Ultrasound  5th Floor: - HeartCare Cardiology (general and EP) - Clinical Pharmacy for coumadin, hypertension, lipid, weight-loss medications, and med management  appointments    Valet parking services will be available as well.

## 2023-11-23 ENCOUNTER — Encounter: Payer: Self-pay | Admitting: Cardiovascular Disease

## 2024-01-15 ENCOUNTER — Inpatient Hospital Stay: Payer: Medicare Other | Attending: Hematology and Oncology

## 2024-01-15 DIAGNOSIS — D472 Monoclonal gammopathy: Secondary | ICD-10-CM | POA: Insufficient documentation

## 2024-01-15 DIAGNOSIS — E559 Vitamin D deficiency, unspecified: Secondary | ICD-10-CM

## 2024-01-15 DIAGNOSIS — Z79899 Other long term (current) drug therapy: Secondary | ICD-10-CM | POA: Insufficient documentation

## 2024-01-15 LAB — COMPREHENSIVE METABOLIC PANEL WITH GFR
ALT: 17 U/L (ref 0–44)
AST: 19 U/L (ref 15–41)
Albumin: 4.3 g/dL (ref 3.5–5.0)
Alkaline Phosphatase: 51 U/L (ref 38–126)
Anion gap: 7 (ref 5–15)
BUN: 21 mg/dL (ref 8–23)
CO2: 25 mmol/L (ref 22–32)
Calcium: 10.2 mg/dL (ref 8.9–10.3)
Chloride: 108 mmol/L (ref 98–111)
Creatinine, Ser: 0.64 mg/dL (ref 0.44–1.00)
GFR, Estimated: >60 mL/min (ref >60)
Glucose, Bld: 127 mg/dL — ABNORMAL HIGH (ref 70–99)
Potassium: 3.7 mmol/L (ref 3.5–5.1)
Sodium: 140 mmol/L (ref 135–145)
Total Bilirubin: 0.5 mg/dL (ref 0.0–1.2)
Total Protein: 7 g/dL (ref 6.5–8.1)

## 2024-01-15 LAB — CBC WITH DIFFERENTIAL/PLATELET
Abs Immature Granulocytes: 0.02 10*3/uL (ref 0.00–0.07)
Basophils Absolute: 0.1 10*3/uL (ref 0.0–0.1)
Basophils Relative: 1 %
Eosinophils Absolute: 0.4 10*3/uL (ref 0.0–0.5)
Eosinophils Relative: 5 %
HCT: 37.7 % (ref 36.0–46.0)
Hemoglobin: 13 g/dL (ref 12.0–15.0)
Immature Granulocytes: 0 %
Lymphocytes Relative: 23 %
Lymphs Abs: 1.7 10*3/uL (ref 0.7–4.0)
MCH: 30.5 pg (ref 26.0–34.0)
MCHC: 34.5 g/dL (ref 30.0–36.0)
MCV: 88.5 fL (ref 80.0–100.0)
Monocytes Absolute: 0.7 10*3/uL (ref 0.1–1.0)
Monocytes Relative: 9 %
Neutro Abs: 4.6 10*3/uL (ref 1.7–7.7)
Neutrophils Relative %: 62 %
Platelets: 236 10*3/uL (ref 150–400)
RBC: 4.26 MIL/uL (ref 3.87–5.11)
RDW: 13.4 % (ref 11.5–15.5)
WBC: 7.4 10*3/uL (ref 4.0–10.5)
nRBC: 0 % (ref 0.0–0.2)

## 2024-01-16 LAB — KAPPA/LAMBDA LIGHT CHAINS
Kappa free light chain: 23.4 mg/L — ABNORMAL HIGH (ref 3.3–19.4)
Kappa, lambda light chain ratio: 3.12 — ABNORMAL HIGH (ref 0.26–1.65)
Lambda free light chains: 7.5 mg/L (ref 5.7–26.3)

## 2024-01-18 LAB — MULTIPLE MYELOMA PANEL, SERUM
Albumin SerPl Elph-Mcnc: 3.9 g/dL (ref 2.9–4.4)
Albumin/Glob SerPl: 1.5 (ref 0.7–1.7)
Alpha 1: 0.2 g/dL (ref 0.0–0.4)
Alpha2 Glob SerPl Elph-Mcnc: 0.8 g/dL (ref 0.4–1.0)
B-Globulin SerPl Elph-Mcnc: 0.9 g/dL (ref 0.7–1.3)
Gamma Glob SerPl Elph-Mcnc: 0.8 g/dL (ref 0.4–1.8)
Globulin, Total: 2.7 g/dL (ref 2.2–3.9)
IgA: 50 mg/dL — ABNORMAL LOW (ref 87–352)
IgG (Immunoglobin G), Serum: 1029 mg/dL (ref 586–1602)
IgM (Immunoglobulin M), Srm: 42 mg/dL (ref 26–217)
M Protein SerPl Elph-Mcnc: 0.6 g/dL — ABNORMAL HIGH
Total Protein ELP: 6.6 g/dL (ref 6.0–8.5)

## 2024-01-25 ENCOUNTER — Inpatient Hospital Stay (HOSPITAL_BASED_OUTPATIENT_CLINIC_OR_DEPARTMENT_OTHER): Payer: Medicare Other | Admitting: Hematology and Oncology

## 2024-01-25 ENCOUNTER — Encounter: Payer: Self-pay | Admitting: Hematology and Oncology

## 2024-01-25 VITALS — BP 136/62 | HR 74 | Temp 99.5°F | Resp 18 | Ht 64.0 in | Wt 173.2 lb

## 2024-01-25 DIAGNOSIS — D472 Monoclonal gammopathy: Secondary | ICD-10-CM

## 2024-01-25 NOTE — Assessment & Plan Note (Addendum)
Her blood work is stable with no signs of progression, renal failure, hypercalcemia or anemia Clinically, she has no signs of disease progression. I will see her on a yearly basis with history, physical examination, blood work to be done 1 week ahead of time prior to her next year return visit.

## 2024-01-25 NOTE — Progress Notes (Signed)
 [facility] OFFICE PROGRESS NOTE  Patient Care Team: Dwight Trula SQUIBB, MD as PCP - General (Internal Medicine) Burnard Debby LABOR, MD as PCP - Cardiology (Cardiology) Luis Purchase, MD as Consulting Physician (Gastroenterology) Cathlyn JAYSON Cary, Bobie BRAVO, MD as Consulting Physician (Obstetrics and Gynecology)  ASSESSMENT & PLAN:  Assessment & Plan Monoclonal gammopathy of unknown significance (MGUS) Her blood work is stable with no signs of progression, renal failure, hypercalcemia or anemia Clinically, she has no signs of disease progression. I will see her on a yearly basis with history, physical examination, blood work to be done 1 week ahead of time prior to her next year return visit.  No orders of the defined types were placed in this encounter.    INTERVAL HISTORY: she returns for surveillance follow-up for diagnosis of MGUS Patient denies recurrent infection or bone pain We reviewed recent CBC, CMP and myeloma panel results  PHYSICAL EXAMINATION: ECOG PERFORMANCE STATUS: 0 - Asymptomatic  Vitals:   01/25/24 1134  BP: 136/62  Pulse: 74  Resp: 18  Temp: 99.5 F (37.5 C)  SpO2: 100%   [facility] OFFICE PROGRESS NOTE  Patient Care Team: Dwight Trula SQUIBB, MD as PCP - General (Internal Medicine) Burnard Debby LABOR, MD as PCP - Cardiology (Cardiology) Luis Purchase, MD as Consulting Physician (Gastroenterology) Cathlyn JAYSON Cary, Bobie BRAVO, MD as Consulting Physician (Obstetrics and Gynecology)  ASSESSMENT & PLAN:  Monoclonal gammopathy of unknown significance (MGUS) Her blood work is stable with no signs of progression, renal failure, hypercalcemia or anemia Clinically, she has no signs of disease progression. I will see her on a yearly basis with history, physical examination, blood work to be done 1 week ahead of time prior to her next year return visit.   No orders of the defined types were placed in this encounter.   All questions were  answered. The patient knows to call the clinic with any problems, questions or concerns. The total time spent in the appointment was 20 minutes encounter with patients including review of chart and various tests results, discussions about plan of care and coordination of care plan   Erika Bedford, MD 01/25/2024 11:45 AM  INTERVAL HISTORY: Please see below for problem oriented charting. she returns for surveillance follow-up for history of MGUS She is doing well She denies recent atypical infection No recent bone pain  REVIEW OF SYSTEMS:   Constitutional: Denies fevers, chills or abnormal weight loss Eyes: Denies blurriness of vision Ears, nose, mouth, throat, and face: Denies mucositis or sore throat Respiratory: Denies cough, dyspnea or wheezes Cardiovascular: Denies palpitation, chest discomfort or lower extremity swelling Gastrointestinal:  Denies nausea, heartburn or change in bowel habits Skin: Denies abnormal skin rashes Lymphatics: Denies new lymphadenopathy or easy bruising Neurological:Denies numbness, tingling or new weaknesses Behavioral/Psych: Mood is stable, no new changes  All other systems were reviewed with the patient and are negative.  I have reviewed the past medical history, past surgical history, social history and family history with the patient and they are unchanged from previous note.  ALLERGIES:  is allergic to other, penicillins, gold sodium thiosulfate, and nickel.  MEDICATIONS:  Current Outpatient Medications  Medication Sig Dispense Refill   alendronate (FOSAMAX) 70 MG tablet Take 70 mg by mouth once a week. Take with a full glass of water on an empty stomach.     carboxymethylcellulose (REFRESH PLUS) 0.5 % SOLN Place 1 drop into both eyes daily as needed (for dry eye).  cholecalciferol (VITAMIN D ) 1000 UNITS tablet Take 2,000 Units by mouth daily.     cyanocobalamin (VITAMIN B12) 1000 MCG tablet Take 1,000 mcg by mouth every other day.     famotidine   (PEPCID ) 40 MG tablet Take 40 mg by mouth daily.     metoprolol  succinate (TOPROL -XL) 25 MG 24 hr tablet Take 1 tablet (25 mg total) by mouth daily. 90 tablet 3   metroNIDAZOLE (METROGEL) 0.75 % gel Apply 1 Application topically daily.     Multiple Vitamins-Minerals (WOMENS MULTIVITAMIN PLUS PO) Take 1 tablet by mouth daily.     olmesartan  (BENICAR ) 20 MG tablet Take 1 tablet (20 mg total) by mouth daily. 90 tablet 3   Omega 3 1200 MG CAPS Take 2 capsules by mouth daily.     rosuvastatin  (CRESTOR ) 20 MG tablet Take 1 tablet (20 mg total) by mouth daily. 90 tablet 2   scopolamine (TRANSDERM-SCOP) 1 MG/3DAYS SMARTSIG:1 Patch(s) Topical PRN     tretinoin (RETIN-A) 0.025 % cream Apply 1 application  topically at bedtime.     No current facility-administered medications for this visit.    SUMMARY OF ONCOLOGIC HISTORY:  Erika Bolton was transferred to my care after her prior physician has left.  I reviewed the patient's records extensive and collaborated the history with the patient. Summary of her history is as follows: This patient was discovered to have MGUS after she was found to have abnormal blood tests. Blood work revealed IgG kappa She was observed.  PHYSICAL EXAMINATION: ECOG PERFORMANCE STATUS: 0 - Asymptomatic  Vitals:   01/25/24 1134  BP: 136/62  Pulse: 74  Resp: 18  Temp: 99.5 F (37.5 C)  SpO2: 100%   Filed Weights   01/25/24 1134  Weight: 173 lb 3.2 oz (78.6 kg)    GENERAL:alert, no distress and comfortable NEURO: alert & oriented x 3 with fluent speech, no focal motor/sensory deficits  LABORATORY DATA:  I have reviewed the data as listed    Component Value Date/Time   NA 140 01/15/2024 1514   NA 142 05/18/2023 0906   NA 140 01/09/2017 1523   K 3.7 01/15/2024 1514   K 4.1 01/09/2017 1523   CL 108 01/15/2024 1514   CL 109 (H) 01/10/2013 1446   CO2 25 01/15/2024 1514   CO2 23 01/09/2017 1523   GLUCOSE 127 (H) 01/15/2024 1514   GLUCOSE 94 01/09/2017 1523    GLUCOSE 116 (H) 01/10/2013 1446   BUN 21 01/15/2024 1514   BUN 18 05/18/2023 0906   BUN 14.1 01/09/2017 1523   CREATININE 0.64 01/15/2024 1514   CREATININE 0.59 01/14/2021 1512   CREATININE 0.7 01/09/2017 1523   CALCIUM  10.2 01/15/2024 1514   CALCIUM  10.5 (H) 01/09/2017 1523   PROT 7.0 01/15/2024 1514   PROT 6.8 05/18/2023 0906   PROT 7.5 01/09/2017 1523   ALBUMIN 4.3 01/15/2024 1514   ALBUMIN 4.6 05/18/2023 0906   ALBUMIN 4.0 01/09/2017 1523   AST 19 01/15/2024 1514   AST 39 01/14/2021 1512   AST 27 01/09/2017 1523   ALT 17 01/15/2024 1514   ALT 47 (H) 01/14/2021 1512   ALT 31 01/09/2017 1523   ALKPHOS 51 01/15/2024 1514   ALKPHOS 94 01/09/2017 1523   BILITOT 0.5 01/15/2024 1514   BILITOT 0.5 05/18/2023 0906   BILITOT 0.4 01/14/2021 1512   BILITOT 0.45 01/09/2017 1523   GFRNONAA >60 01/15/2024 1514   GFRNONAA >60 01/14/2021 1512   GFRAA >60 01/09/2020 1115    No  results found for: SPEP, UPEP  Lab Results  Component Value Date   WBC 7.4 01/15/2024   NEUTROABS 4.6 01/15/2024   HGB 13.0 01/15/2024   HCT 37.7 01/15/2024   MCV 88.5 01/15/2024   PLT 236 01/15/2024      Chemistry      Component Value Date/Time   NA 140 01/15/2024 1514   NA 142 05/18/2023 0906   NA 140 01/09/2017 1523   K 3.7 01/15/2024 1514   K 4.1 01/09/2017 1523   CL 108 01/15/2024 1514   CL 109 (H) 01/10/2013 1446   CO2 25 01/15/2024 1514   CO2 23 01/09/2017 1523   BUN 21 01/15/2024 1514   BUN 18 05/18/2023 0906   BUN 14.1 01/09/2017 1523   CREATININE 0.64 01/15/2024 1514   CREATININE 0.59 01/14/2021 1512   CREATININE 0.7 01/09/2017 1523      Component Value Date/Time   CALCIUM  10.2 01/15/2024 1514   CALCIUM  10.5 (H) 01/09/2017 1523   ALKPHOS 51 01/15/2024 1514   ALKPHOS 94 01/09/2017 1523   AST 19 01/15/2024 1514   AST 39 01/14/2021 1512   AST 27 01/09/2017 1523   ALT 17 01/15/2024 1514   ALT 47 (H) 01/14/2021 1512   ALT 31 01/09/2017 1523   BILITOT 0.5 01/15/2024 1514    BILITOT 0.5 05/18/2023 0906   BILITOT 0.4 01/14/2021 1512   BILITOT 0.45 01/09/2017 1523     SUMMARY OF ONCOLOGIC HISTORY: Erika Bolton was transferred to my care after her prior physician has left.  I reviewed the patient's records extensive and collaborated the history with the patient. Summary of her history is as follows: This patient was discovered to have MGUS after she was found to have abnormal blood tests. Blood work revealed IgG kappa She was observed.

## 2024-01-28 NOTE — Progress Notes (Shared)
 [facility] - Clinic Note  02/06/2024     CHIEF COMPLAINT Patient presents for No chief complaint on file.    HISTORY OF PRESENT ILLNESS: Erika Bolton is a 68 y.o. female who presents to the clinic today for:    Pt states   Referring physician: Waylan Cain, MD [address] [place], [place] [address]  HISTORICAL INFORMATION:   Selected notes from the MEDICAL RECORD NUMBER Referred by Dr. Waylan for ERM OU LEE: 02/15/2021 BCVA OD: 20/30 OS: 20/50 Ocular Hx- history of cryo for retinal tear OD, ERM, cataracts PMH- HTN, anxiety    CURRENT MEDICATIONS: Current Outpatient Medications (Ophthalmic Drugs)  Medication Sig   carboxymethylcellulose (REFRESH PLUS) 0.5 % SOLN Place 1 drop into both eyes daily as needed (for dry eye).   No current facility-administered medications for this visit. (Ophthalmic Drugs)   Current Outpatient Medications (Other)  Medication Sig   alendronate (FOSAMAX) 70 MG tablet Take 70 mg by mouth once a week. Take with a full glass of water on an empty stomach.   cholecalciferol (VITAMIN D ) 1000 UNITS tablet Take 2,000 Units by mouth daily.   cyanocobalamin (VITAMIN B12) 1000 MCG tablet Take 1,000 mcg by mouth every other day.   famotidine  (PEPCID ) 40 MG tablet Take 40 mg by mouth daily.   metoprolol  succinate (TOPROL -XL) 25 MG 24 hr tablet Take 1 tablet (25 mg total) by mouth daily.   metroNIDAZOLE (METROGEL) 0.75 % gel Apply 1 Application topically daily.   Multiple Vitamins-Minerals (WOMENS MULTIVITAMIN PLUS PO) Take 1 tablet by mouth daily.   olmesartan  (BENICAR ) 20 MG tablet Take 1 tablet (20 mg total) by mouth daily.   Omega 3 1200 MG CAPS Take 2 capsules by mouth daily.   rosuvastatin  (CRESTOR ) 20 MG tablet Take 1 tablet (20 mg total) by mouth daily.   scopolamine (TRANSDERM-SCOP) 1 MG/3DAYS SMARTSIG:1 Patch(s) Topical PRN   tretinoin (RETIN-A) 0.025 % cream Apply 1 application  topically at bedtime.   No current  facility-administered medications for this visit. (Other)   REVIEW OF SYSTEMS:   ALLERGIES Allergies  Allergen Reactions   Other Other (See Comments)    Potassium dichromate, broke out in dry rashes   Penicillins Swelling    Throat swelling - hasn't had since was a teen   Gold Sodium Thiosulfate Rash   Nickel Rash   PAST MEDICAL HISTORY Past Medical History:  Diagnosis Date   Colon polyps 2003   Detached retina    Hypertension    Hypertensive retinopathy    Kidney stone 2016   Left bundle branch block 2012   Menorrhagia    MGUS (monoclonal gammopathy of unknown significance)    Plantar fasciitis    Precancerous lesion 1987   mole excised from vulva   PVD (posterior vitreous detachment), right 09/07/2015   TIA (transient ischemic attack) 2024   Past Surgical History:  Procedure Laterality Date   ABLATION  11/10/2002   Hysteroscopic Thermal   CATARACT EXTRACTION     cyst on scalp     sebaccous(multiple times as a teen)   EYE SURGERY     gastro surgery  07/31/2010   LAPAROSCOPIC CHOLECYSTECTOMY  11/28/2001   left leg muscle surgery  07/31/2010   OTHER SURGICAL HISTORY     Sebaceous cyst on head removed 7x's   RETINAL DETACHMENT SURGERY      FAMILY HISTORY Family History  Problem Relation Age of Onset   Colon cancer Father 62  Alzheimer's disease Father    Emphysema Father    COPD Father    Heart attack Mother    Cancer Brother        peritod gland cancer   Breast cancer Maternal Aunt        post menopause   SOCIAL HISTORY Social History   Tobacco Use   Smoking status: Never   Smokeless tobacco: Never  Vaping Use   Vaping status: Never Used  Substance Use Topics   Alcohol use: Yes    Alcohol/week: 3.0 standard drinks of alcohol    Types: 3 Glasses of wine per week   Drug use: No       OPHTHALMIC EXAM: Not recorded    IMAGING AND PROCEDURES  Imaging and Procedures for 02/06/2024           ASSESSMENT/PLAN:  No diagnosis  found.   1. Epiretinal membrane, OU (OD>OS) - BCVA OD 20/30 from 20/20; OS 20/20 - stablre - mild metamorphopsia improved OD - OCT OD shows ERM with pucker and peri-foveal thickening greatest temporal fovea; trace cystic changes ST fovea; OS w/ mild ERM/pucker superior macula - no indication for surgery at this time, but will monitor a little more closely - f/u 6 mos, sooner prn-- DFE/OCT   2. History of retinal detachment OD  - s/p pneumatic retinopexy OD 02.24.18 at [facility] w/ Dr. Mindi  - HST at 1100 -- good cryo  - retina stably reattached, no RT/RD  3,4. Hypertensive retinopathy OU - discussed importance of tight BP control - monitor  5. Pseudophakia OU  - s/p CE/IOL OU (Dr. Waylan - mid-September '22)  - OD with CTR -- no pseudophakodonesis  - IOLs in good position, doing well  - monitor  Ophthalmic Meds Ordered this visit:  No orders of the defined types were placed in this encounter.    No follow-ups on file.  There are no Patient Instructions on file for this visit.  Explained the diagnoses, plan, and follow up with the patient and they expressed understanding.  Patient expressed understanding of the importance of proper follow up care.     [place] JUDITHANN Hans, M.D., Ph.D. Diseases & Surgery of the Retina and Vitreous [facility]   Abbreviations: M myopia (nearsighted); A astigmatism; H hyperopia (farsighted); P presbyopia; Mrx spectacle prescription;  CTL contact lenses; OD right eye; OS left eye; OU both eyes  XT exotropia; ET esotropia; PEK punctate epithelial keratitis; PEE punctate epithelial erosions; DES dry eye syndrome; MGD meibomian gland dysfunction; ATs artificial tears; PFAT's preservative free artificial tears; NSC nuclear sclerotic cataract; PSC posterior subcapsular cataract; ERM epi-retinal membrane; PVD posterior vitreous detachment; RD retinal detachment; DM diabetes mellitus; DR diabetic retinopathy; NPDR non-proliferative  diabetic retinopathy; PDR proliferative diabetic retinopathy; CSME clinically significant macular edema; DME diabetic macular edema; dbh dot blot hemorrhages; CWS cotton wool spot; POAG primary open angle glaucoma; C/D cup-to-disc ratio; HVF humphrey visual field; GVF goldmann visual field; OCT optical coherence tomography; IOP intraocular pressure; BRVO Branch retinal vein occlusion; CRVO central retinal vein occlusion; CRAO central retinal artery occlusion; BRAO branch retinal artery occlusion; RT retinal tear; SB scleral buckle; PPV pars plana vitrectomy; VH Vitreous hemorrhage; PRP panretinal laser photocoagulation; IVK intravitreal kenalog; VMT vitreomacular traction; MH Macular hole;  NVD neovascularization of the disc; NVE neovascularization elsewhere; AREDS age related eye disease study; ARMD age related macular degeneration; POAG primary open angle glaucoma; EBMD epithelial/anterior basement membrane dystrophy; ACIOL anterior chamber intraocular lens; IOL intraocular lens;  PCIOL posterior chamber intraocular lens; Phaco/IOL phacoemulsification with intraocular lens placement; PRK photorefractive keratectomy; LASIK laser assisted in situ keratomileusis; HTN hypertension; DM diabetes mellitus; COPD chronic obstructive pulmonary disease

## 2024-02-06 ENCOUNTER — Encounter (INDEPENDENT_AMBULATORY_CARE_PROVIDER_SITE_OTHER): Payer: Medicare Other | Admitting: Ophthalmology

## 2024-02-06 DIAGNOSIS — I10 Essential (primary) hypertension: Secondary | ICD-10-CM

## 2024-02-06 DIAGNOSIS — H35373 Puckering of macula, bilateral: Secondary | ICD-10-CM

## 2024-02-06 DIAGNOSIS — H35033 Hypertensive retinopathy, bilateral: Secondary | ICD-10-CM

## 2024-02-06 DIAGNOSIS — Z961 Presence of intraocular lens: Secondary | ICD-10-CM

## 2024-02-06 DIAGNOSIS — Z8669 Personal history of other diseases of the nervous system and sense organs: Secondary | ICD-10-CM

## 2024-02-07 NOTE — Progress Notes (Signed)
 [facility] - Clinic Note  02/08/2024     CHIEF COMPLAINT Patient presents for Retina Follow Up    HISTORY OF PRESENT ILLNESS: Erika Bolton is a 68 y.o. female who presents to the clinic today for:   HPI     Retina Follow Up   Diagnosis: Epiretinal membrane, OU (OD>OS).  In both eyes.  This started 3 years ago.  Severity is moderate.  Duration of 6 months.  Since onset it is stable.  I, the attending physician,  performed the HPI with the patient and updated documentation appropriately.        Comments   Pt states she just had annual exam with optometrist 01/10/2024 and everything was looking good. Pt states she wears a CL in the right eye for NVA and OS is DVA. Pt denies FOL/floaters/pain. Pt is using Blink OD and Refresh OS 3-4 times per day for dry eye.        Last edited by Valdemar Rogue, MD on 02/08/2024 11:31 PM.     Pt states she has noticed any difference in her vision, her eyes seem dry, she uses Blink in her right eye for the CL and Refresh in the left eye  Referring physician: Waylan Cain, MD [address] [place], [place] [address]  HISTORICAL INFORMATION:   Selected notes from the MEDICAL RECORD NUMBER Referred by Dr. Waylan for ERM OU LEE: 02/15/2021 BCVA OD: 20/30 OS: 20/50 Ocular Hx- history of cryo for retinal tear OD, ERM, cataracts PMH- HTN, anxiety    CURRENT MEDICATIONS: Current Outpatient Medications (Ophthalmic Drugs)  Medication Sig   carboxymethylcellulose (REFRESH PLUS) 0.5 % SOLN Place 1 drop into both eyes daily as needed (for dry eye).   No current facility-administered medications for this visit. (Ophthalmic Drugs)   Current Outpatient Medications (Other)  Medication Sig   alendronate (FOSAMAX) 70 MG tablet Take 70 mg by mouth once a week. Take with a full glass of water on an empty stomach.   cholecalciferol (VITAMIN D ) 1000 UNITS tablet Take 2,000 Units by mouth daily.   cyanocobalamin (VITAMIN B12) 1000 MCG  tablet Take 1,000 mcg by mouth every other day.   famotidine  (PEPCID ) 40 MG tablet Take 40 mg by mouth daily.   metoprolol  succinate (TOPROL -XL) 25 MG 24 hr tablet Take 1 tablet (25 mg total) by mouth daily.   metroNIDAZOLE (METROGEL) 0.75 % gel Apply 1 Application topically daily.   Multiple Vitamins-Minerals (WOMENS MULTIVITAMIN PLUS PO) Take 1 tablet by mouth daily.   olmesartan  (BENICAR ) 20 MG tablet Take 1 tablet (20 mg total) by mouth daily.   Omega 3 1200 MG CAPS Take 2 capsules by mouth daily.   rosuvastatin  (CRESTOR ) 20 MG tablet Take 1 tablet (20 mg total) by mouth daily.   scopolamine (TRANSDERM-SCOP) 1 MG/3DAYS SMARTSIG:1 Patch(s) Topical PRN   tretinoin (RETIN-A) 0.025 % cream Apply 1 application  topically at bedtime.   No current facility-administered medications for this visit. (Other)   REVIEW OF SYSTEMS: ROS   Positive for: Gastrointestinal, Eyes Negative for: Constitutional, Neurological, Skin, Genitourinary, Musculoskeletal, HENT, Endocrine, Cardiovascular, Respiratory, Psychiatric, Allergic/Imm, Heme/Lymph Last edited by Elnor Avelina RAMAN, COT on 02/08/2024  2:01 PM.      ALLERGIES Allergies  Allergen Reactions   Other Other (See Comments)    Potassium dichromate, broke out in dry rashes   Penicillins Swelling    Throat swelling - hasn't had since was a teen   Gold Sodium Thiosulfate Rash  Nickel Rash   PAST MEDICAL HISTORY Past Medical History:  Diagnosis Date   Colon polyps 2003   Detached retina    Hypertension    Hypertensive retinopathy    Kidney stone 2016   Left bundle branch block 2012   Menorrhagia    MGUS (monoclonal gammopathy of unknown significance)    Plantar fasciitis    Precancerous lesion 1987   mole excised from vulva   PVD (posterior vitreous detachment), right 09/07/2015   TIA (transient ischemic attack) 2024   Past Surgical History:  Procedure Laterality Date   ABLATION  11/10/2002   Hysteroscopic Thermal   CATARACT  EXTRACTION     cyst on scalp     sebaccous(multiple times as a teen)   EYE SURGERY     gastro surgery  07/31/2010   LAPAROSCOPIC CHOLECYSTECTOMY  11/28/2001   left leg muscle surgery  07/31/2010   OTHER SURGICAL HISTORY     Sebaceous cyst on head removed 7x's   RETINAL DETACHMENT SURGERY      FAMILY HISTORY Family History  Problem Relation Age of Onset   Colon cancer Father 29   Alzheimer's disease Father    Emphysema Father    COPD Father    Heart attack Mother    Cancer Brother        peritod gland cancer   Breast cancer Maternal Aunt        post menopause   SOCIAL HISTORY Social History   Tobacco Use   Smoking status: Never   Smokeless tobacco: Never  Vaping Use   Vaping status: Never Used  Substance Use Topics   Alcohol use: Yes    Alcohol/week: 3.0 standard drinks of alcohol    Types: 3 Glasses of wine per week   Drug use: No       OPHTHALMIC EXAM: Base Eye Exam     Visual Acuity (Snellen - Linear)       Right Left   Dist [place]  20/20 -1   Dist cc 20/70 -1    Dist ph cc 20/30     Correction: Contacts         Tonometry (Tonopen, 2:12 PM)       Right Left   Pressure 12 13         Pupils       Pupils Dark Light Shape React APD   Right PERRL 4 2 Round Brisk None   Left PERRL 4 2 Round Brisk None         Visual Fields       Left Right    Full Full         Extraocular Movement       Right Left    Full, Ortho Full, Ortho         Neuro/Psych     Oriented x3: Yes   Mood/Affect: Normal         Dilation     Both eyes: 1.0% Mydriacyl, 2.5% Phenylephrine @ 2:12 PM           Slit Lamp and Fundus Exam     Slit Lamp Exam       Right Left   Lids/Lashes Dermatochalasis - upper lid Dermatochalasis - upper lid   Conjunctiva/Sclera White and quiet White and quiet   Cornea Arcus, well healed temporal cataract wound, mild tear film debris Arcus, well healed cataract wound mild trace tear film debris   Anterior Chamber deep  and clear deep and clear  Iris Round and dilated Round and dilated   Lens PC IOL in good position with open PC PC IOL in good position with open PC   Anterior Vitreous Vitreous syneresis, Posterior vitreous detachment, Vitreous condensations, vitreous debris Vitreous syneresis, Posterior vitreous detachment, Weiss ring, vitreous condensations         Fundus Exam       Right Left   Disc mild pallor, sharp rim, tilted, temp PPA/PPP Pink and sharp, tilted, temp PPA   C/D Ratio 0.3 0.5   Macula Flat, Blunted foveal reflex, ERM with striae greatest temporal to fovea, focal point of fibrosis temporal to fovea, trace cystic changes centrally -- all stable Flat, Good foveal reflex, RPE mottling and clumping, mild ERM, no heme or edema   Vessels attenuated, mild tortuosity attenuated, mild tortuosity   Periphery Attached, Good cryo changes 1100, Inferior pavingstone, no new RT/RD Attached, pigmented pavingstone degeneration inferiorly, No RT/RD           IMAGING AND PROCEDURES  Imaging and Procedures for 02/08/2024  OCT, Retina - OU - Both Eyes       Right Eye Quality was good. Central Foveal Thickness: 304. Progression has been stable. Findings include no IRF, no SRF, abnormal foveal contour, myopic contour, epiretinal membrane, macular pucker (ERM with pucker and peri-foveal thickening greatest temporal fovea; myopic contour; trace cystic changes ST fovea).   Left Eye Quality was good. Central Foveal Thickness: 266. Progression has been stable. Findings include normal foveal contour, no IRF, no SRF, myopic contour, epiretinal membrane, macular pucker (Mild ERM/pucker superior macula).   Notes *Images captured and stored on drive  Diagnosis / Impression:  ERM OU (OD>OS) Myopic contour OU OD: ERM with pucker and peri-foveal thickening greatest temporal fovea; trace cystic changes ST fovea OS: Mild ERM/pucker superior macula  Clinical management:  See below  Abbreviations: NFP -  Normal foveal profile. CME - cystoid macular edema. PED - pigment epithelial detachment. IRF - intraretinal fluid. SRF - subretinal fluid. EZ - ellipsoid zone. ERM - epiretinal membrane. ORA - outer retinal atrophy. ORT - outer retinal tubulation. SRHM - subretinal hyper-reflective material. IRHM - intraretinal hyper-reflective material            ASSESSMENT/PLAN:    ICD-10-CM   1. Epiretinal membrane (ERM) of both eyes  H35.373 OCT, Retina - OU - Both Eyes    2. History of retinal detachment  Z86.69     3. Essential hypertension  I10     4. Hypertensive retinopathy of both eyes  H35.033     5. Pseudophakia of both eyes  Z96.1      1. Epiretinal membrane, OU (OD>OS) - BCVA OD 20/30; OS 20/20 - stable OU - mild metamorphopsia OD - OCT stable OU: OD shows ERM with pucker and peri-foveal thickening greatest temporal fovea; trace cystic changes ST fovea; OS w/ mild ERM/pucker superior macula - no indication for surgery at this time - f/u 6-9 mos, sooner prn-- DFE/OCT   2. History of retinal detachment OD  - s/p pneumatic retinopexy OD 02.24.18 at [facility] w/ Dr. Mindi  - HST at 1100 -- good cryo  - retina stably reattached, no RT/RD  3,4. Hypertensive retinopathy OU - discussed importance of tight BP control - monitor  5. Pseudophakia OU  - s/p CE/IOL OU (Dr. Waylan - mid-September '22)  - OD with CTR -- no pseudophakodonesis  - IOLs in good position, doing well  - monitor  Ophthalmic Meds Ordered this visit:  No  orders of the defined types were placed in this encounter.    Return for f/u 6-9 months, ERM OU, DFE, OCT.  There are no Patient Instructions on file for this visit.  This document serves as a record of services personally performed by Redell JUDITHANN Hans, MD, PhD. It was created on their behalf by Delon Newness COT, an ophthalmic technician. The creation of this record is the provider's dictation and/or activities during the visit.    Electronically signed  by: Delon Newness COT 07.10.2025  11:32 PM  This document serves as a record of services personally performed by Redell JUDITHANN Hans, MD, PhD. It was created on their behalf by Alan PARAS. Delores, OA an ophthalmic technician. The creation of this record is the provider's dictation and/or activities during the visit.    Electronically signed by: Alan PARAS. Delores, OA 02/08/24 11:32 PM  Redell JUDITHANN Hans, M.D., Ph.D. Diseases & Surgery of the Retina and Vitreous [facility] 02/08/2024   I have reviewed the above documentation for accuracy and completeness, and I agree with the above. Redell JUDITHANN Hans, M.D., Ph.D. 02/08/24 11:34 PM   Abbreviations: M myopia (nearsighted); A astigmatism; H hyperopia (farsighted); P presbyopia; Mrx spectacle prescription;  CTL contact lenses; OD right eye; OS left eye; OU both eyes  XT exotropia; ET esotropia; PEK punctate epithelial keratitis; PEE punctate epithelial erosions; DES dry eye syndrome; MGD meibomian gland dysfunction; ATs artificial tears; PFAT's preservative free artificial tears; NSC nuclear sclerotic cataract; PSC posterior subcapsular cataract; ERM epi-retinal membrane; PVD posterior vitreous detachment; RD retinal detachment; DM diabetes mellitus; DR diabetic retinopathy; NPDR non-proliferative diabetic retinopathy; PDR proliferative diabetic retinopathy; CSME clinically significant macular edema; DME diabetic macular edema; dbh dot blot hemorrhages; CWS cotton wool spot; POAG primary open angle glaucoma; C/D cup-to-disc ratio; HVF humphrey visual field; GVF goldmann visual field; OCT optical coherence tomography; IOP intraocular pressure; BRVO Branch retinal vein occlusion; CRVO central retinal vein occlusion; CRAO central retinal artery occlusion; BRAO branch retinal artery occlusion; RT retinal tear; SB scleral buckle; PPV pars plana vitrectomy; VH Vitreous hemorrhage; PRP panretinal laser photocoagulation; IVK intravitreal kenalog;  VMT vitreomacular traction; MH Macular hole;  NVD neovascularization of the disc; NVE neovascularization elsewhere; AREDS age related eye disease study; ARMD age related macular degeneration; POAG primary open angle glaucoma; EBMD epithelial/anterior basement membrane dystrophy; ACIOL anterior chamber intraocular lens; IOL intraocular lens; PCIOL posterior chamber intraocular lens; Phaco/IOL phacoemulsification with intraocular lens placement; PRK photorefractive keratectomy; LASIK laser assisted in situ keratomileusis; HTN hypertension; DM diabetes mellitus; COPD chronic obstructive pulmonary disease

## 2024-02-08 ENCOUNTER — Ambulatory Visit (INDEPENDENT_AMBULATORY_CARE_PROVIDER_SITE_OTHER): Admitting: Ophthalmology

## 2024-02-08 ENCOUNTER — Encounter (INDEPENDENT_AMBULATORY_CARE_PROVIDER_SITE_OTHER): Payer: Self-pay | Admitting: Ophthalmology

## 2024-02-08 DIAGNOSIS — H35373 Puckering of macula, bilateral: Secondary | ICD-10-CM

## 2024-02-08 DIAGNOSIS — Z8669 Personal history of other diseases of the nervous system and sense organs: Secondary | ICD-10-CM | POA: Diagnosis not present

## 2024-02-08 DIAGNOSIS — H35033 Hypertensive retinopathy, bilateral: Secondary | ICD-10-CM

## 2024-02-08 DIAGNOSIS — I10 Essential (primary) hypertension: Secondary | ICD-10-CM

## 2024-02-08 DIAGNOSIS — Z961 Presence of intraocular lens: Secondary | ICD-10-CM

## 2024-03-14 ENCOUNTER — Telehealth: Payer: Self-pay | Admitting: *Deleted

## 2024-03-14 NOTE — Telephone Encounter (Signed)
 Spoke with patient. Patient started yuvafem  12 weeks ago, due to place next tablet Monday night. Asking if she should place a tablet Monday or hold off?   Advised to hold off on using Monday night, since pap scheduled for Tuesday.   Advised I will send to Dr. Nikki for review, our office will f/u if any additional recommendations. Patient agreeable.   Routing to provider for final review. Patient is agreeable to disposition. Will close encounter.

## 2024-03-14 NOTE — Telephone Encounter (Signed)
 Patient left message on triage line. States she has used vaginal estrogen in the past prior to pap smear, is unclear of instructions prior to exam. Scheduled for 1 yr f/u 03/18/24.   Per review of 03/14/23, Rx given so she can start 6-12 weeks prior to next pap.   Left message to call [facility] Triage at [phone], option 4.

## 2024-03-18 ENCOUNTER — Encounter: Payer: Self-pay | Admitting: Obstetrics and Gynecology

## 2024-03-18 ENCOUNTER — Ambulatory Visit (INDEPENDENT_AMBULATORY_CARE_PROVIDER_SITE_OTHER): Payer: Medicare Other | Admitting: Obstetrics and Gynecology

## 2024-03-18 ENCOUNTER — Other Ambulatory Visit (HOSPITAL_COMMUNITY)
Admission: RE | Admit: 2024-03-18 | Discharge: 2024-03-18 | Disposition: A | Discharge status: Home / Self Care | Source: Ambulatory Visit | Attending: Obstetrics and Gynecology | Admitting: Obstetrics and Gynecology

## 2024-03-18 VITALS — BP 122/76 | HR 82 | Ht 65.0 in | Wt 170.0 lb

## 2024-03-18 DIAGNOSIS — Z8742 Personal history of other diseases of the female genital tract: Secondary | ICD-10-CM | POA: Diagnosis not present

## 2024-03-18 DIAGNOSIS — Z1151 Encounter for screening for human papillomavirus (HPV): Secondary | ICD-10-CM | POA: Diagnosis not present

## 2024-03-18 DIAGNOSIS — Z01419 Encounter for gynecological examination (general) (routine) without abnormal findings: Secondary | ICD-10-CM

## 2024-03-18 DIAGNOSIS — E210 Primary hyperparathyroidism: Secondary | ICD-10-CM | POA: Diagnosis not present

## 2024-03-18 DIAGNOSIS — Z124 Encounter for screening for malignant neoplasm of cervix: Secondary | ICD-10-CM | POA: Insufficient documentation

## 2024-03-18 DIAGNOSIS — M8580 Other specified disorders of bone density and structure, unspecified site: Secondary | ICD-10-CM

## 2024-03-18 DIAGNOSIS — N841 Polyp of cervix uteri: Secondary | ICD-10-CM

## 2024-03-18 DIAGNOSIS — Z780 Asymptomatic menopausal state: Secondary | ICD-10-CM | POA: Diagnosis not present

## 2024-03-18 DIAGNOSIS — Z9289 Personal history of other medical treatment: Secondary | ICD-10-CM | POA: Diagnosis not present

## 2024-03-18 NOTE — Patient Instructions (Signed)
 Calcium in Foods Calcium is a mineral in the body. Of all the minerals in your body, you have the most calcium. Most of the body's calcium supply is stored in bones and teeth. Calcium helps many parts of the body work, including: Blood and blood vessels. Nerves. Hormones. Muscles. Bones and teeth. When your calcium stores are low, you may be at risk for low bone mass, bone loss, and broken bones. When you get enough calcium, it helps to support strong bones and teeth throughout your life. Calcium is especially important for: Children during growth spurts. Females during adolescence. Females who are pregnant or breastfeeding. Females after their menstrual cycle stops (postmenopausal). Females whose menstrual cycle has stopped because of an eating disorder or regular intense exercise. People who can't eat or digest dairy products. People who eat a vegan diet. Recommended daily amounts of calcium: Females (ages 87 to 55): 1,000 mg per day. Females (ages 35 and older): 1,200 mg per day. Males (ages 53 to 45): 1,000 mg per day. Males (ages 43 and older): 1,200 mg per day. Females (ages 88 to 28): 1,300 mg per day. Males (ages 41 to 56): 1,300 mg per day. General information Eat foods that are high in calcium. Try to get most of your calcium from food. Some people may benefit from taking calcium supplements. Check with your health care provider or an expert in healthy eating called a dietitian before starting any calcium supplements. Calcium supplements may interact with certain medicines. Too much calcium may cause other health problems, such as trouble pooping and kidney stones. For the body to absorb calcium, it needs vitamin D. Sources of vitamin D include: Skin exposure to direct sunlight. Foods, such as egg yolks, liver, mushrooms, saltwater fish, and fortified milk. Vitamin D supplements. Check with your provider or dietitian before starting any vitamin D supplements. The amount of  calcium that is absorbed in the body varies with type of food. Talk to a dietitian about what foods are best for you, especially if you are eat a vegan diet or don't eat dairy. What foods are high in calcium?  Foods that are high in calcium contain more than 100 milligrams per serving. Fruits Fortified orange juice or other fruit juice, 300 mg per 8 oz (237 mL) serving. Vegetables Collard greens, 260 mg per 1 cup (130 g) serving, cooked. Kale, 180 mg per 1 cup (118 g) serving, cooked. Bok choy, 180 mg per 1 cup (170 g) serving, cooked Grains Fortified frozen waffles, 200 mg in 2 waffles. Oatmeal, 180 mg in 1 cup (234 g) serving, cooked. Fortified white bread, 175 mg per slice. Meats and other proteins Sardines, canned with bones, 350 mg per 3.75 oz (92 g) serving. Salmon, canned with bones, 168 mg per 3 oz (85 g) serving. Canned shrimp, 125 mg per 3 oz (85 g) serving. Baked beans, 120 mg per 1 cup (266 g) serving. Tofu, firm, made with calcium sulfate, 861 mg per  cup (126 g) serving. Dairy Yogurt, plain, low-fat, 448 mg per 1 cup (245 g) serving Nonfat milk, 300 mg per 1 cup (245 g) serving. American cheese, 145 mg per 1 oz (21 g) serving or 1 slice. Cheddar cheese, 200 mg per 1 oz (28 g) serving or 1 slice. Cottage cheese 2%, 125 mg per  cup (113 g) serving. Fortified soy, rice, or almond milk, 300 mg per 1 cup (237 mL) serving. Mozzarella, part skim, 210 mg per 1 oz (21 g) serving. The items listed  above may not be a complete list of foods high in calcium. Actual amounts of calcium may be different depending on processing. Contact a dietitian for more information. What foods are lower in calcium? Foods that are lower in calcium contain 50 mg or less per serving. Fruits Apple, 1 medium, about 6 mg. Banana, 1 medium, about 12 mg. Vegetables Lettuce, 19 mg per 1 cup (35 g) serving. Tomato, 1 small, about 11 mg. Grains Rice, white, 8 mg per  cup (79 g) serving. Boiled  potatoes, 14 mg per 1 cup (160 g) serving. White bread, 6 mg per slice. Meats and other proteins Egg, 24 mg per 1 egg (50 g). Red meat, 7 mg per 4 oz (80 g) serving. Chicken, 17 mg per 4 oz (113 g) serving. Fish, cod, or trout, 20 mg per 4 oz (140 g) serving. Dairy Cream cheese, regular, 14 mg per 1 Tbsp (15 g) serving. Brie cheese, 50 mg per 1 oz (32 g) serving. The items listed above may not be a complete list of foods lower in calcium. Actual amounts of calcium may be different depending on processing. Contact a dietitian for more information. This information is not intended to replace advice given to you by your health care provider. Make sure you discuss any questions you have with your health care provider. Document Revised: 04/14/2023 Document Reviewed: 04/14/2023 Elsevier Patient Education  2024 Else[employer] Inc.  EXERCISE AND DIET:  We recommended that you start or continue a regular exercise program for good health. Regular exercise means any activity that makes your heart beat faster and makes you sweat.  We recommend exercising at least 30 minutes per day at least 3 days a week, preferably 4 or 5.  We also recommend a diet low in fat and sugar.  Inactivity, poor dietary choices and obesity can cause diabetes, heart attack, stroke, and kidney damage, among others.    ALCOHOL AND SMOKING:  Women should limit their alcohol intake to no more than 7 drinks/beers/glasses of wine (combined, not each!) per week. Moderation of alcohol intake to this level decreases your risk of breast cancer and liver damage. And of course, no recreational drugs are part of a healthy lifestyle.  And absolutely no smoking or even second hand smoke. Most people know smoking can cause heart and lung diseases, but did you know it also contributes to weakening of your bones? Aging of your skin?  Yellowing of your teeth and nails?  CALCIUM AND VITAMIN D:  Adequate intake of calcium and Vitamin D are recommended.  The  recommendations for exact amounts of these supplements seem to change often, but generally speaking 600 mg of calcium (either carbonate or citrate) and 800 units of Vitamin D per day seems prudent. Certain women may benefit from higher intake of Vitamin D.  If you are among these women, your doctor will have told you during your visit.    PAP SMEARS:  Pap smears, to check for cervical cancer or precancers,  have traditionally been done yearly, although recent scientific advances have shown that most women can have pap smears less often.  However, every woman still should have a physical exam from her gynecologist every year. It will include a breast check, inspection of the vulva and vagina to check for abnormal growths or skin changes, a visual exam of the cervix, and then an exam to evaluate the size and shape of the uterus and ovaries.  And after 66 years of age, a rectal exam  is indicated to check for rectal cancers. We will also provide age appropriate advice regarding health maintenance, like when you should have certain vaccines, screening for sexually transmitted diseases, bone density testing, colonoscopy, mammograms, etc.   MAMMOGRAMS:  All women over 11 years old should have a yearly mammogram. Many facilities now offer a "3D" mammogram, which may cost around $50 extra out of pocket. If possible,  we recommend you accept the option to have the 3D mammogram performed.  It both reduces the number of women who will be called back for extra views which then turn out to be normal, and it is better than the routine mammogram at detecting truly abnormal areas.    COLONOSCOPY:  Colonoscopy to screen for colon cancer is recommended for all women at age 8.  We know, you hate the idea of the prep.  We agree, BUT, having colon cancer and not knowing it is worse!!  Colon cancer so often starts as a polyp that can be seen and removed at colonscopy, which can quite literally save your life!  And if your first  colonoscopy is normal and you have no family history of colon cancer, most women don't have to have it again for 10 years.  Once every ten years, you can do something that may end up saving your life, right?  We will be happy to help you get it scheduled when you are ready.  Be sure to check your insurance coverage so you understand how much it will cost.  It may be covered as a preventative service at no cost, but you should check your particular policy.

## 2024-03-18 NOTE — Progress Notes (Unsigned)
 68 y.o. G22P2002 Married Caucasian female here for a breast and pelvic exam.    The patient is also followed for ***.  She used Vagifem  for 3 months in preparation for the visit today.  Asking about calcium  intake.  On fosamax through endocrinology.   She has hypercalcemia and hyperparathyroidism. Seeing endocrinology and general surgery.    Taking vit D 1000 international {:8051996::unit,units}  daily.  Retired.    PCP: Dwight Trula SQUIBB, MD   Patient's last menstrual period was 08/01/2007 (approximate).           Sexually active: No.  The current method of family planning is post menopausal status.    Menopausal hormone therapy:  n/a Exercising: Yes.    [employer] Smoker:  no  OB History     Gravida  2   Para  2   Term  2   Preterm  0   AB  0   Living  2      SAB  0   IAB  0   Ectopic  0   Multiple  0   Live Births  2           HEALTH MAINTENANCE: Last 2 paps: 03/14/23 neg, 06/14/22 neg History of abnormal Pap or positive HPV:  yes Mammogram:  09/10/23 Breast Density Cat A, BIRADS Cat 1 neg Colonoscopy:  03/31/13, fall 2024 - polyps - [facility] - due in 5 years Bone Density:  09/10/23  Result  osteopenia.  On fosamax.    Immunization History  Administered Date(s) Administered   Fluzone Influenza virus vaccine,trivalent (IIV3), split virus 06/22/2014   Influenza Inj Mdck Quad Pf 05/07/2018   Influenza,inj,Quad PF,6+ Mos 05/19/2017   Influenza-Unspecified 06/05/2012, 06/22/2014, 04/30/2020   PFIZER(Purple Top)SARS-COV-2 Vaccination 10/16/2019, 11/10/2019   Pfizer(Comirnaty )Fall Seasonal Vaccine 12 years and older 05/23/2022, 05/22/2023   Pneumococcal Conjugate Pcv21, Polysaccharide Crm197 Conjugaf 10/17/2023   Respiratory Syncytial Virus Vaccine ,Recomb Aduvanted(Arexvy ) 10/02/2023   Tdap 02/24/2012, 09/12/2016   Tetanus 07/31/1997, 08/01/2007   Zoster Recombinant(Shingrix) 07/25/2017      reports that she has never smoked. She has never used  smokeless tobacco. She reports current alcohol use of about 3.0 standard drinks of alcohol per week. She reports that she does not use drugs.  Past Medical History:  Diagnosis Date   Colon polyps 2003   Detached retina    Hypertension    Hypertensive retinopathy    Kidney stone 2016   Left bundle branch block 2012   Menorrhagia    MGUS (monoclonal gammopathy of unknown significance)    Plantar fasciitis    Precancerous lesion 1987   mole excised from vulva   PVD (posterior vitreous detachment), right 09/07/2015   TIA (transient ischemic attack) 2024    Past Surgical History:  Procedure Laterality Date   ABLATION  11/10/2002   Hysteroscopic Thermal   CATARACT EXTRACTION     cyst on scalp     sebaccous(multiple times as a teen)   EYE SURGERY     gastro surgery  07/31/2010   LAPAROSCOPIC CHOLECYSTECTOMY  11/28/2001   left leg muscle surgery  07/31/2010   OTHER SURGICAL HISTORY     Sebaceous cyst on head removed 7x's   RETINAL DETACHMENT SURGERY      Current Outpatient Medications  Medication Sig Dispense Refill   alendronate (FOSAMAX) 70 MG tablet Take 70 mg by mouth once a week. Take with a full glass of water on an empty stomach.     amLODipine (NORVASC)  5 MG tablet TAKE 1 TABLET BY MOUTH EVERY DAY FOR 90 DAYS Oral; Duration: 30     aspirin  EC 81 MG tablet 1 tablet Orally Once a day     calcium  carbonate (SUPER CALCIUM ) 1500 (600 Ca) MG TABS tablet Orally     carboxymethylcellulose (REFRESH PLUS) 0.5 % SOLN Place 1 drop into both eyes daily as needed (for dry eye).     cholecalciferol (VITAMIN D ) 1000 UNITS tablet Take 2,000 Units by mouth daily.     cyanocobalamin (VITAMIN B12) 1000 MCG tablet Take 1,000 mcg by mouth every other day.     famotidine  (PEPCID ) 40 MG tablet Take 40 mg by mouth daily.     metoprolol  succinate (TOPROL -XL) 25 MG 24 hr tablet Take 1 tablet (25 mg total) by mouth daily. 90 tablet 3   metroNIDAZOLE (METROGEL) 0.75 % gel Apply 1 Application  topically daily.     Multiple Vitamins-Minerals (WOMENS MULTIVITAMIN PLUS PO) Take 1 tablet by mouth daily.     olmesartan  (BENICAR ) 20 MG tablet Take 1 tablet (20 mg total) by mouth daily. 90 tablet 3   Omega 3 1200 MG CAPS Take 2 capsules by mouth daily.     omeprazole (PRILOSEC) 20 MG capsule 1 capsule.     rosuvastatin  (CRESTOR ) 20 MG tablet Take 1 tablet (20 mg total) by mouth daily. 90 tablet 2   tretinoin (RETIN-A) 0.025 % cream Apply 1 application  topically at bedtime.     YUVAFEM  10 MCG TABS vaginal tablet PLACE 1 TABLET VAGINALLY 2 TIMES A WEEK.     scopolamine (TRANSDERM-SCOP) 1 MG/3DAYS SMARTSIG:1 Patch(s) Topical PRN (Patient not taking: Reported on 03/18/2024)     No current facility-administered medications for this visit.    ALLERGIES: Other, Penicillins, Gold sodium thiosulfate, and Nickel  Family History  Problem Relation Age of Onset   Colon cancer Father 41   Alzheimer's disease Father    Emphysema Father    COPD Father    Heart attack Mother    Cancer Brother        peritod gland cancer   Breast cancer Maternal Aunt        post menopause    Review of Systems  All other systems reviewed and are negative.   PHYSICAL EXAM:  BP 122/76 (BP Location: Left Arm, Patient Position: Sitting)   Pulse 82   Ht 5' 5 (1.651 m)   Wt 170 lb (77.1 kg)   LMP 08/01/2007 (Approximate)   SpO2 97%   BMI 28.29 kg/m     General appearance: alert, cooperative and appears stated age Head: normocephalic, without obvious abnormality, atraumatic Neck: no adenopathy, supple, symmetrical, trachea midline and thyroid normal to inspection and palpation Lungs: clear to auscultation bilaterally Breasts: normal appearance, no masses or tenderness, No nipple retraction or dimpling, No nipple discharge or bleeding, No axillary adenopathy Heart: regular rate and rhythm Abdomen: soft, non-tender; no masses, no organomegaly Extremities: extremities normal, atraumatic, no cyanosis or  edema Skin: skin color, texture, turgor normal. No rashes or lesions Lymph nodes: cervical, supraclavicular, and axillary nodes normal. Neurologic: grossly normal  Pelvic: External genitalia:  no lesions              No abnormal inguinal nodes palpated.              Urethra:  normal appearing urethra with no masses, tenderness or lesions              Bartholins and Skenes: normal  Vagina: normal appearing vagina with normal color and discharge, no lesions              Cervix: no lesions              Pap taken: {yes no:314532} Bimanual Exam:  Uterus:  normal size, contour, position, consistency, mobility, non-tender              Adnexa: no mass, fullness, tenderness              Rectal exam: {yes no:314532}.  Confirms.              Anus:  normal sphincter tone, no lesions  Chaperone was present for exam:  Damien FALCON, CMA  ASSESSMENT: Encounter for breast and pelvic exam.  Hx atypical epithelial cells and atypical endometrial cells on pap 2023 - Dr. Jertson.  Her EMB under ultrasound guidance was not diagnostic. Her ECC showed benign endocervix.  Hx endometrial ablation limiting evaluation.   CERVICAL POLYP TODAY>    Stop vagifem    Small fibroids.   Hs potential TIA 10/02/22?? ***Do follow up pap again in 2025.  On Fosamax.  PCP or endo prescribing?  Hx atypical endometrial cells on pap.  EMB nondiagnostic. ECC benign.  Follow up pap normal.  Hx use of Vagifem  for 3 months in preparation for pap.  Cervical cancer screening.  Cervical polyp removal today. Hx possible TIA prior to initiating vaginal estrogen use. Osteopenia.    FH colon cancer.   ***  PLAN: Mammogram screening discussed. Self breast awareness reviewed. Pap and HRV collected:  {yes no:314532} Guidelines for Calcium , Vitamin D , regular exercise program including cardiovascular and weight bearing exercise. Medication refills:  ***   Follow up:  ***    Additional counseling given.  {yes  [id number]. ***  total time was spent for this patient encounter, including preparation, face-to-face counseling with the patient, coordination of care, and documentation of the encounter in addition to doing the breast and pelvic exam.

## 2024-03-20 LAB — CYTOLOGY - PAP
Comment: NEGATIVE
Diagnosis: NEGATIVE
High risk HPV: NEGATIVE

## 2024-03-20 NOTE — Progress Notes (Signed)
 GYNECOLOGY  VISIT   HPI: 68 y.o.   Married  Caucasian female   G2P2002 with Patient's last menstrual period was 08/01/2007 (approximate).   here for: Cervical polyp removal      Hx TIA.   On ASA 81 mg daily.    GYNECOLOGIC HISTORY: Patient's last menstrual period was 08/01/2007 (approximate). Contraception:  PMP Menopausal hormone therapy:  n/a Last 2 paps:  03/18/24 neg HR HPV neg, 03/14/23 neg History of abnormal Pap or positive HPV:  yes Mammogram:  09/10/23 Breast Density Cat A, BIRADS Cat 1 neg         OB History     Gravida  2   Para  2   Term  2   Preterm  0   AB  0   Living  2      SAB  0   IAB  0   Ectopic  0   Multiple  0   Live Births  2              Patient Active Problem List   Diagnosis Date Noted   Cardiomyopathy (HCC) 10/03/2022   TIA (transient ischemic attack) 10/02/2022   HTN (hypertension) 10/02/2022   Cervical spinal stenosis 10/06/2021   Numbness 10/03/2021   Hyperreflexia 10/03/2021   Gait disturbance 10/03/2021   Epiretinal membrane (ERM) of both eyes 02/16/2021   Family history of colon cancer in father 02/14/2018   GERD (gastroesophageal reflux disease) 02/14/2018   History of adenomatous polyp of colon 02/14/2018   Transaminitis 02/14/2018   Sprain of right ankle 01/14/2018   Hypercalcemia 01/17/2017   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   Kidney stone    Fatty infiltration of liver 01/18/2015   Vitamin D  deficiency 07/27/2014   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   Monoclonal gammopathy of unknown significance (MGUS) 01/05/2012    Past Medical History:  Diagnosis Date   Colon polyps 2003   Detached retina    Hypertension    Hypertensive retinopathy    Kidney stone 2016   Left bundle branch block 2012   Menorrhagia    MGUS (monoclonal gammopathy of unknown significance)    Plantar fasciitis    Precancerous lesion 1987   mole excised from vulva   PVD (posterior  vitreous detachment), right 09/07/2015   TIA (transient ischemic attack) 2024    Past Surgical History:  Procedure Laterality Date   ABLATION  11/10/2002   Hysteroscopic Thermal   CATARACT EXTRACTION     cyst on scalp     sebaccous(multiple times as a teen)   EYE SURGERY     gastro surgery  07/31/2010   LAPAROSCOPIC CHOLECYSTECTOMY  11/28/2001   left leg muscle surgery  07/31/2010   OTHER SURGICAL HISTORY     Sebaceous cyst on head removed 7x's   RETINAL DETACHMENT SURGERY      Current Outpatient Medications  Medication Sig Dispense Refill   alendronate (FOSAMAX) 70 MG tablet Take 70 mg by mouth once a week. Take with a full glass of water on an empty stomach.     amLODipine (NORVASC) 5 MG tablet TAKE 1 TABLET BY MOUTH EVERY DAY FOR 90 DAYS Oral; Duration: 30     aspirin  EC 81 MG tablet 1 tablet Orally Once a day     calcium  carbonate (SUPER CALCIUM ) 1500 (600 Ca) MG TABS tablet Orally     carboxymethylcellulose (REFRESH PLUS) 0.5 % SOLN Place 1 drop into both eyes  daily as needed (for dry eye).     cholecalciferol (VITAMIN D ) 1000 UNITS tablet Take 2,000 Units by mouth daily.     cyanocobalamin (VITAMIN B12) 1000 MCG tablet Take 1,000 mcg by mouth every other day.     famotidine  (PEPCID ) 40 MG tablet Take 40 mg by mouth daily.     metoprolol  succinate (TOPROL -XL) 25 MG 24 hr tablet Take 1 tablet (25 mg total) by mouth daily. 90 tablet 3   metroNIDAZOLE (METROGEL) 0.75 % gel Apply 1 Application topically daily.     Multiple Vitamins-Minerals (WOMENS MULTIVITAMIN PLUS PO) Take 1 tablet by mouth daily.     olmesartan  (BENICAR ) 20 MG tablet Take 1 tablet (20 mg total) by mouth daily. 90 tablet 3   Omega 3 1200 MG CAPS Take 2 capsules by mouth daily.     omeprazole (PRILOSEC) 20 MG capsule 1 capsule.     rosuvastatin  (CRESTOR ) 20 MG tablet Take 1 tablet (20 mg total) by mouth daily. 90 tablet 2   tretinoin (RETIN-A) 0.025 % cream Apply 1 application  topically at bedtime.      YUVAFEM  10 MCG TABS vaginal tablet PLACE 1 TABLET VAGINALLY 2 TIMES A WEEK.     scopolamine (TRANSDERM-SCOP) 1 MG/3DAYS SMARTSIG:1 Patch(s) Topical PRN (Patient not taking: Reported on 03/24/2024)     No current facility-administered medications for this visit.     ALLERGIES: Other, Penicillins, Gold sodium thiosulfate, and Nickel  Family History  Problem Relation Age of Onset   Colon cancer Father 24   Alzheimer's disease Father    Emphysema Father    COPD Father    Heart attack Mother    Cancer Brother        peritod gland cancer   Breast cancer Maternal Aunt        post menopause    Social History   Socioeconomic History   Marital status: Married    Spouse name: Not on file   Number of children: 2   Years of education: M.ED.   Highest education level: Not on file  Occupational History    Comment: [employer]  Tobacco Use   Smoking status: Never   Smokeless tobacco: Never  Vaping Use   Vaping status: Never Used  Substance and Sexual Activity   Alcohol use: Yes    Alcohol/week: 3.0 standard drinks of alcohol    Types: 3 Glasses of wine per week   Drug use: No   Sexual activity: Not Currently    Partners: Male    Birth control/protection: Post-menopausal    Comment: has had abnormal pap in the 2023, Less than 5, after 18, no std, no des, no hx of breast cancer , yes abnormal pap  Other Topics Concern   Not on file  Social History Narrative   2 cups of coffee a day    Social Drivers of Corporate investment banker Strain: Not on file  Food Insecurity: Not on file  Transportation Needs: Not on file  Physical Activity: Not on file  Stress: Not on file  Social Connections: Not on file  Intimate Partner Violence: Not on file    Review of Systems  All other systems reviewed and are negative.   PHYSICAL EXAMINATION:   BP 122/78 (BP Location: Left Arm, Patient Position: Sitting)   Pulse 88   LMP 08/01/2007 (Approximate)   SpO2 95%     General  appearance: alert, cooperative and appears stated age   Pelvic: External genitalia:  no  lesions              Urethra:  normal appearing urethra with no masses, tenderness or lesions              Bartholins and Skenes: normal                 Vagina: normal appearing vagina with normal color and discharge, no lesions              Cervix: no lesions.  4 mm polyp extruding from cervical os.                  Cervical polypectomy Consent and time out done.  Hibiclens prep.  Paracervical block with 10 cc 1% lidocaine, lot [id number], exp Sept. 2026. Tenaculum to anterior cervical lip.  Polyp forceps and ring forceps used to remove polyp, which was sent to pathology.  Silver nitrate and then Monsel's applied to polyp removal site.  No complications.  Minimal EBL.  Chaperone was present for exam:  Kari HERO, CMA  ASSESSMENT:  Cervical polypectomy.  Hx atypical epithelial cells and atypical endometrial cells with neg HR HPV on pap 2023.  EMB was nondiagnostic then.  Hx endometrial ablation  Pap 2024 normal. Pap 03/18/24 neg, neg HR HPV.  Hx TIA.  PLAN:  FU pathology report.  Post procedure precautions given. Will use vaginal estradiol  in preparation for pap visits and not on an ongoing basis. Next cervical cancer screening in 3 years.

## 2024-03-22 ENCOUNTER — Ambulatory Visit: Payer: Self-pay | Admitting: Obstetrics and Gynecology

## 2024-03-24 ENCOUNTER — Other Ambulatory Visit (HOSPITAL_COMMUNITY)
Admission: RE | Admit: 2024-03-24 | Discharge: 2024-03-24 | Disposition: A | Discharge status: Home / Self Care | Source: Ambulatory Visit | Attending: Obstetrics and Gynecology | Admitting: Obstetrics and Gynecology

## 2024-03-24 ENCOUNTER — Encounter: Payer: Self-pay | Admitting: Obstetrics and Gynecology

## 2024-03-24 ENCOUNTER — Ambulatory Visit (INDEPENDENT_AMBULATORY_CARE_PROVIDER_SITE_OTHER): Admitting: Obstetrics and Gynecology

## 2024-03-24 VITALS — BP 122/78 | HR 88

## 2024-03-24 DIAGNOSIS — N841 Polyp of cervix uteri: Secondary | ICD-10-CM | POA: Diagnosis present

## 2024-03-24 NOTE — Addendum Note (Signed)
 Addended by: CATHLYN JAYSON NIKKI BOBIE E on: 03/24/2024 01:31 PM   Modules accepted: Orders

## 2024-03-24 NOTE — Patient Instructions (Signed)
 Call for heavy vaginal bleeding, pain or fever.

## 2024-03-25 ENCOUNTER — Other Ambulatory Visit: Payer: Self-pay

## 2024-03-25 ENCOUNTER — Encounter (HOSPITAL_BASED_OUTPATIENT_CLINIC_OR_DEPARTMENT_OTHER): Payer: Self-pay | Admitting: Physical Therapy

## 2024-03-25 ENCOUNTER — Ambulatory Visit (HOSPITAL_BASED_OUTPATIENT_CLINIC_OR_DEPARTMENT_OTHER): Attending: Internal Medicine | Admitting: Physical Therapy

## 2024-03-25 DIAGNOSIS — R2689 Other abnormalities of gait and mobility: Secondary | ICD-10-CM | POA: Diagnosis present

## 2024-03-25 DIAGNOSIS — M25552 Pain in left hip: Secondary | ICD-10-CM | POA: Insufficient documentation

## 2024-03-25 DIAGNOSIS — M25551 Pain in right hip: Secondary | ICD-10-CM | POA: Insufficient documentation

## 2024-03-25 NOTE — Therapy (Signed)
 OUTPATIENT PHYSICAL THERAPY LOWER EXTREMITY EVALUATION   Patient Name: Erika Bolton MRN: [id number] DOB:18-Mar-1956, 68 y.o., female Today's Date: 03/25/2024  END OF SESSION:  PT End of Session - 03/25/24 1536     Visit Number 1    Number of Visits 5    Date for PT Re-Evaluation 05/23/24    Authorization Type [employer] mcr    PT Start Time 0115    PT Stop Time 0155    PT Time Calculation (min) 40 min    Activity Tolerance Patient tolerated treatment well    Behavior During Therapy [facility] for tasks assessed/performed          Past Medical History:  Diagnosis Date   Colon polyps 2003   Detached retina    Hypertension    Hypertensive retinopathy    Kidney stone 2016   Left bundle branch block 2012   Menorrhagia    MGUS (monoclonal gammopathy of unknown significance)    Plantar fasciitis    Precancerous lesion 1987   mole excised from vulva   PVD (posterior vitreous detachment), right 09/07/2015   TIA (transient ischemic attack) 2024   Past Surgical History:  Procedure Laterality Date   ABLATION  11/10/2002   Hysteroscopic Thermal   CATARACT EXTRACTION     cyst on scalp     sebaccous(multiple times as a teen)   EYE SURGERY     gastro surgery  07/31/2010   LAPAROSCOPIC CHOLECYSTECTOMY  11/28/2001   left leg muscle surgery  07/31/2010   OTHER SURGICAL HISTORY     Sebaceous cyst on head removed 7x's   RETINAL DETACHMENT SURGERY     Patient Active Problem List   Diagnosis Date Noted   Cardiomyopathy (HCC) 10/03/2022   TIA (transient ischemic attack) 10/02/2022   HTN (hypertension) 10/02/2022   Cervical spinal stenosis 10/06/2021   Numbness 10/03/2021   Hyperreflexia 10/03/2021   Gait disturbance 10/03/2021   Epiretinal membrane (ERM) of both eyes 02/16/2021   Family history of colon cancer in father 02/14/2018   GERD (gastroesophageal reflux disease) 02/14/2018   History of adenomatous polyp of colon 02/14/2018   Transaminitis 02/14/2018   Sprain of right ankle  01/14/2018   Hypercalcemia 01/17/2017   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   Kidney stone    Fatty infiltration of liver 01/18/2015   Vitamin D  deficiency 07/27/2014   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  She reports she exercises 5-6 days a week in all settings (water;aerobic classes; Tai chi). Hips are very tight since this past spring. Pain worst at night when I lay on my sides.  PERTINENT HISTORY: TIA 2024 PAIN:  Are you having pain? Yes: NPRS scale: current 0/10; worst 5/10 Pain location: bilat hips Pain description: ache intermittent Aggravating factors: standing still x 30 mins and side lying Relieving factors: resting/sitting 10 minutes  PRECAUTIONS: None  RED FLAGS: None   WEIGHT BEARING RESTRICTIONS: No  FALLS:  Has patient fallen in last 6 months? No  LIVING ENVIRONMENT: Lives  with: lives with their family Lives in: House/apartment  Stairs: Yes: Internal: 16 steps; on right going up Has following equipment at home: None  OCCUPATION: retired; exercise  PLOF: Independent  PATIENT GOALS: relieve hip pain, be able to sleep side lying  NEXT MD VISIT: 2 month  OBJECTIVE:  Note: Objective measures were completed at Evaluation unless otherwise noted.  DIAGNOSTIC FINDINGS: none  PATIENT SURVEYS:  LEFS: 77/80  COGNITION: Overall cognitive status: Within functional limits for tasks assessed     SENSATION: WFL  EDEMA:  none   POSTURE: No Significant postural limitations  PALPATION: Mild TTP bilateral trochanter bursa  LOWER EXTREMITY ROM:  wfl  LOWER EXTREMITY MMT:  MMT Right eval Left eval  Hip flexion 40.5 36.0  Hip extension    Hip abduction 25.3 30.5  Hip adduction    Hip internal rotation    Hip external rotation    Knee flexion    Knee extension    Ankle dorsiflexion    Ankle plantarflexion    Ankle inversion    Ankle eversion     (Blank rows = not tested)   FUNCTIONAL TESTS:  5 times sit to stand: 10.57 Timed up and go (TUG): 6.98  GAIT: Distance walked: 500 ft Assistive device utilized: None Level of assistance: Complete Independence Comments: normal pattern                                                                                                                                TREATMENT  Eval Self care:Posture and Optometrist  instruction    PATIENT EDUCATION:  Education details: Discussed eval findings, rehab rationale, aquatic program progression/POC and pools in area. Patient is in agreement  Person educated: Patient Education method: Explanation Education comprehension: verbalized understanding  HOME EXERCISE PROGRAM: Pt follows individual exercise regimen 6 days a week  ASSESSMENT:  CLINICAL IMPRESSION: Patient is a 68 y.o. f who was seen today for physical therapy evaluation and treatment for bilat trochanteric bursitis. She reports stiffness then pain began back in Feb 2025 and has with slow progression, gotten worse. She has taken a 10 day course of Mobic  which seemed to help minimally.  Pain primarily at night when she is trying to sleep awakens her when side lying multiple times per night.  She has a fairly rigorous exercise regimen working out 6 days a week none of which appear to be too highly intense but is completed routinely 6 days a week.  Pt instructed to reduce her exercises to every other day allowing some rest/recovery with  anticipation of reduction in hip pain. She is in agreement and vu.  She will benefit from a few aquatic therapy sessions for instruction on using the properties of water to reduce load in hips (buoyancy), strengthen (using viscosity and foam for resistance) and pressure to reduce inflammation of bursa (hydrostatic pressure) to improve condition and relieve pain.  OBJECTIVE IMPAIRMENTS: pain.   ACTIVITY  LIMITATIONS: standing  PARTICIPATION LIMITATIONS: meal prep  REHAB POTENTIAL: Excellent  CLINICAL DECISION MAKING: Stable/uncomplicated  EVALUATION COMPLEXITY: Low   GOALS: Goals reviewed with patient? No  SHORT TERM GOALS: Target date: 05/23/24 (delay in services beginning due to scheduling conflicts) Pt will tolerate full aquatic sessions consistently without increase in pain and with improving function to demonstrate good toleration and effectiveness of intervention.  Baseline: Goal status: INITIAL  2.  Pt will be indep with final aquatic HEP for continued management of condition Baseline:  Goal status: INITIAL  3.  Pt will report decrease in pain by at least 50% for improved toleration to activity/sleeping quality and to demonstrate improved management of pain. Baseline:  Goal status: INITIAL  4.  Pt will report toleration of stadning and cooking for up towards 45 mins unlimited due to hip pain. Baseline:  Goal status: INITIAL    LONG TERM GOALS:will assign at re-cert if approp   PLAN:  PT FREQUENCY: 1x/week  PT DURATION: 8 weeks 4 visits.  Scheduled out 8 weeks due to scheduling conflicts  PLANNED INTERVENTIONS: 97164- PT Re-evaluation, 97750- Physical Performance Testing, 97110-Therapeutic exercises, 97530- Therapeutic activity, 97112- Neuromuscular re-education, [phone]- Self Care, 02859- Manual therapy, [phone]- Gait training, [phone]- Orthotic Initial, [phone]- Aquatic Therapy, [phone]- Electrical stimulation (manual), [phone] (1-2 muscles), [phone] (3+ muscles)- Dry Needling,  Patient/Family education, Balance training, Stair training, Taping, Joint mobilization, DME instructions, Cryotherapy, and Moist heat  PLAN FOR NEXT SESSION: aquatic for ROM and strengthening bilat hips; pain management; positioning   Ronal Kem) Selam Pietsch MPT 03/25/24 3:40 PM [facility] [address] [place], [place], [address] Phone: [phone]   Fax:  [phone]  Date of referral: 02/20/24 Referring provider: Dasie Fitch, MD  Referring diagnosis? Bilater trochanteric bursitis Treatment diagnosis? (if different than referring diagnosis) no  What was this (referring dx) caused by? Unspecified  Nature of Condition: Chronic (continuous duration > 3 months)   Laterality: Both  Current Functional Measure Score: LEFS 77/80  Objective measurements identify impairments when they are compared to normal values, the uninvolved extremity, and prior level of function.  []  Yes  [x]  No  Objective assessment of functional ability: Minimal functional limitations   Briefly describe symptoms: bilat hip pain with standing up to 30 mins and sleeping particularly side lying  How did symptoms start: with exercise  Average pain intensity:  Last 24 hours: 3  Past week: 3  How often does the pt experience symptoms? Intermittently  How much have the symptoms interfered with usual daily activities? A little bit  How has condition changed since care began at this facility? NA - initial visit  In general, how is the patients overall health? Very Good   BACK PAIN (STarT Back Screening Tool) No

## 2024-03-26 ENCOUNTER — Ambulatory Visit: Payer: Self-pay | Admitting: Obstetrics and Gynecology

## 2024-03-26 LAB — SURGICAL PATHOLOGY

## 2024-04-01 ENCOUNTER — Other Ambulatory Visit: Payer: Self-pay

## 2024-04-01 NOTE — Telephone Encounter (Signed)
 Med refill request: Yuvafem  Last AEX: 03/18/24 Next AEX:03/24/25 Last MMG (if hormonal med) 09/10/23 birads cat 1 neg  Refill authorized: last rx 01/08/24 #24 with 0 refills. Please approve or deny

## 2024-04-02 ENCOUNTER — Ambulatory Visit (HOSPITAL_BASED_OUTPATIENT_CLINIC_OR_DEPARTMENT_OTHER): Attending: Internal Medicine | Admitting: Physical Therapy

## 2024-04-02 ENCOUNTER — Encounter (HOSPITAL_BASED_OUTPATIENT_CLINIC_OR_DEPARTMENT_OTHER): Payer: Self-pay | Admitting: Physical Therapy

## 2024-04-02 DIAGNOSIS — M25552 Pain in left hip: Secondary | ICD-10-CM | POA: Insufficient documentation

## 2024-04-02 DIAGNOSIS — M25551 Pain in right hip: Secondary | ICD-10-CM | POA: Diagnosis present

## 2024-04-02 DIAGNOSIS — R2689 Other abnormalities of gait and mobility: Secondary | ICD-10-CM | POA: Diagnosis present

## 2024-04-02 MED ORDER — YUVAFEM 10 MCG VA TABS: 10.0000 ug | ORAL_TABLET | VAGINAL | 0 refills | Status: AC

## 2024-04-02 NOTE — Therapy (Signed)
 OUTPATIENT PHYSICAL THERAPY LOWER EXTREMITY TREATMENT   Patient Name: Erika Bolton MRN: [id number] DOB:[birth year], 68 y.o., female Today's Date: 04/02/2024  END OF SESSION:  PT End of Session - 04/02/24 1102     Visit Number 2    Number of Visits 5    Date for PT Re-Evaluation 05/23/24    Authorization Type UHC mcr    Authorization - Visit Number 2    Authorization - Number of Visits 8    PT Start Time 1102    PT Stop Time 1140    PT Time Calculation (min) 38 min    Activity Tolerance Patient tolerated treatment well    Behavior During Therapy [facility] for tasks assessed/performed          Past Medical History:  Diagnosis Date   Colon polyps 2003   Detached retina    Hypertension    Hypertensive retinopathy    Kidney stone 2016   Left bundle branch block 2012   Menorrhagia    MGUS (monoclonal gammopathy of unknown significance)    Plantar fasciitis    Precancerous lesion 1987   mole excised from vulva   PVD (posterior vitreous detachment), right 09/07/2015   TIA (transient ischemic attack) 2024   Past Surgical History:  Procedure Laterality Date   ABLATION  11/10/2002   Hysteroscopic Thermal   CATARACT EXTRACTION     cyst on scalp     sebaccous(multiple times as a teen)   EYE SURGERY     gastro surgery  07/31/2010   LAPAROSCOPIC CHOLECYSTECTOMY  11/28/2001   left leg muscle surgery  07/31/2010   OTHER SURGICAL HISTORY     Sebaceous cyst on head removed 7x's   RETINAL DETACHMENT SURGERY     Patient Active Problem List   Diagnosis Date Noted   Cardiomyopathy (HCC) 10/03/2022   TIA (transient ischemic attack) 10/02/2022   HTN (hypertension) 10/02/2022   Cervical spinal stenosis 10/06/2021   Numbness 10/03/2021   Hyperreflexia 10/03/2021   Gait disturbance 10/03/2021   Epiretinal membrane (ERM) of both eyes 02/16/2021   Family history of colon cancer in father 02/14/2018   GERD (gastroesophageal reflux disease) 02/14/2018   History of adenomatous polyp of  colon 02/14/2018   Transaminitis 02/14/2018   Sprain of right ankle 01/14/2018   Hypercalcemia 01/17/2017   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   Kidney stone    Fatty infiltration of liver 01/18/2015   Vitamin D  deficiency 07/27/2014   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   Monoclonal gammopathy of unknown significance (MGUS) 01/05/2012    PCP: Trula Dellen MD  REFERRING PROVIDER: Aleck Dawn MD  REFERRING DIAG:  M70.61 (ICD-10-CM) - Trochanteric bursitis, right hip  M70.62 (ICD-10-CM) - Trochanteric bursitis, left hip    THERAPY DIAG:  Bilateral hip pain  Other abnormalities of gait and mobility  Rationale for Evaluation and Treatment: Rehabilitation  ONSET DATE: 6 months  SUBJECTIVE:   SUBJECTIVE STATEMENT: I have backed off on my exercise and my hips do feel better.  Pain continues to be mostly at night still   Initial Subjective Pt a member here at [employer].  She reports she exercises 5-6 days a week in all settings (water;aerobic classes; Tai chi). Hips are very tight since this past spring. Pain worst at night when I lay on my sides.  PERTINENT HISTORY: TIA 2024 PAIN:  Are you having pain? Yes: NPRS scale: current 0/10; worst 5/10 Pain location: bilat hips Pain description: ache intermittent  Aggravating factors: standing still x 30 mins and side lying Relieving factors: resting/sitting 10 minutes  PRECAUTIONS: None  RED FLAGS: None   WEIGHT BEARING RESTRICTIONS: No  FALLS:  Has patient fallen in last 6 months? No  LIVING ENVIRONMENT: Lives with: lives with their family Lives in: House/apartment  Stairs: Yes: Internal: 16 steps; on right going up Has following equipment at home: None  OCCUPATION: retired; exercise  PLOF: Independent  PATIENT GOALS: relieve hip pain, be able to sleep side lying  NEXT MD VISIT: 2 month  OBJECTIVE:  Note: Objective measures were completed at Evaluation unless  otherwise noted.  DIAGNOSTIC FINDINGS: none  PATIENT SURVEYS:  LEFS: 77/80  COGNITION: Overall cognitive status: Within functional limits for tasks assessed     SENSATION: WFL  EDEMA: none   POSTURE: No Significant postural limitations  PALPATION: Mild TTP bilateral trochanter bursa  LOWER EXTREMITY ROM:  wfl  LOWER EXTREMITY MMT:  MMT Right eval Left eval  Hip flexion 40.5 36.0  Hip extension    Hip abduction 25.3 30.5  Hip adduction    Hip internal rotation    Hip external rotation    Knee flexion    Knee extension    Ankle dorsiflexion    Ankle plantarflexion    Ankle inversion    Ankle eversion     (Blank rows = not tested)   FUNCTIONAL TESTS:  5 times sit to stand: 10.57 Timed up and go (TUG): 6.98  GAIT: Distance walked: 500 ft Assistive device utilized: None Level of assistance: Complete Independence Comments: normal pattern                                                                                                                                TREATMENT  [facility] Adult PT Treatment:                                                DATE: 04/02/24 Pt seen for aquatic therapy today.  Treatment took place in water 3.5-4.75 ft in depth at the [facility] pool. Temp of water was 91.  Pt entered/exited the pool via stair suing step through pattern with hand rail.  *Intro to setting *walking forward, back and side stepping in 3.6 ft with yellow HB carry bilaterally then unilaterally *Hamstring and gstroc stretching on steps *figure 4 stretch *yellow HB pull down wide stance then staggered stance 4.0 ft x 10 *side stepping ->lunge ue add/abd  4 widths. (Difficulty coordinating) *cycling on noodle unsupported:ue support corner wall: hip add/abd; hip flex/ext   Pt requires the buoyancy and hydrostatic pressure of water for support, and to offload joints by unweighting joint load by at least 50 % in navel deep water and by at least 75-80% in  chest to neck deep water.  Viscosity of  the water is needed for resistance of strengthening. Water current perturbations provides challenge to standing balance requiring increased core activation.      PATIENT EDUCATION:  Education details: Discussed eval findings, rehab rationale, aquatic program progression/POC and pools in area. Patient is in agreement  Person educated: Patient Education method: Explanation Education comprehension: verbalized understanding  HOME EXERCISE PROGRAM: Pt follows individual exercise regimen 6 days a week  ASSESSMENT:  CLINICAL IMPRESSION: Pt demonstrates safety and independence in aquatic setting with therapist instructing from deck. She is confident in setting, moving throughout all depths easily.  Pt is directed through various movement patterns and trials in both sitting and standing positions. Focus on stretching of LE glute and hip rotators. She requires VC and demonstration for coordination of exercises. She does report decreasing frequency of daily exercise routine with a reduction in overall hip pain.  Good toleration to session today.  Goals are ongoing.    Patient is a 68 y.o. f who was seen today for physical therapy evaluation and treatment for bilat trochanteric bursitis. She reports stiffness then pain began back in Feb 2025 and has with slow progression, gotten worse. She has taken a 10 day course of Mobic  which seemed to help minimally.  Pain primarily at night when she is trying to sleep awakens her when side lying multiple times per night.  She has a fairly rigorous exercise regimen working out 6 days a week none of which appear to be too highly intense but is completed routinely 6 days a week.  Pt instructed to reduce her exercises to every other day allowing some rest/recovery with anticipation of reduction in hip pain. She is in agreement and vu.  She will benefit from a few aquatic therapy sessions for instruction on using the properties of  water to reduce load in hips (buoyancy), strengthen (using viscosity and foam for resistance) and pressure to reduce inflammation of bursa (hydrostatic pressure) to improve condition and relieve pain.  OBJECTIVE IMPAIRMENTS: pain.   ACTIVITY LIMITATIONS: standing  PARTICIPATION LIMITATIONS: meal prep  REHAB POTENTIAL: Excellent  CLINICAL DECISION MAKING: Stable/uncomplicated  EVALUATION COMPLEXITY: Low   GOALS: Goals reviewed with patient? No  SHORT TERM GOALS: Target date: 05/23/24 (delay in services beginning due to scheduling conflicts) Pt will tolerate full aquatic sessions consistently without increase in pain and with improving function to demonstrate good toleration and effectiveness of intervention.  Baseline: Goal status: INITIAL  2.  Pt will be indep with final aquatic HEP for continued management of condition Baseline:  Goal status: INITIAL  3.  Pt will report decrease in pain by at least 50% for improved toleration to activity/sleeping quality and to demonstrate improved management of pain. Baseline:  Goal status: INITIAL  4.  Pt will report toleration of stadning and cooking for up towards 45 mins unlimited due to hip pain. Baseline:  Goal status: INITIAL    LONG TERM GOALS:will assign at re-cert if approp   PLAN:  PT FREQUENCY: 1x/week  PT DURATION: 8 weeks 4 visits.  Scheduled out 8 weeks due to scheduling conflicts  PLANNED INTERVENTIONS: 97164- PT Re-evaluation, 97750- Physical Performance Testing, 97110-Therapeutic exercises, 97530- Therapeutic activity, [id number]- Neuromuscular re-education, 97535- Self Care, 02859- Manual therapy, [id number]- Gait training, [phone]- Orthotic Initial, [phone]- Aquatic Therapy, [phone]- Electrical stimulation (manual), [phone] (1-2 muscles), [phone] (3+ muscles)- Dry Needling, Patient/Family education, Balance training, Stair training, Taping, Joint mobilization, DME instructions, Cryotherapy, and Moist heat  PLAN FOR NEXT SESSION:  aquatic for ROM and strengthening bilat hips;  pain management; positioning   Ronal Foots) Shelbey Spindler MPT 04/02/24 11:20 AM [facility] [address] [place], [place], [address] Phone: [phone]   Fax:  [phone]  Date of referral: 02/20/24 Referring provider: Dasie Fitch, MD  Referring diagnosis? Bilater trochanteric bursitis Treatment diagnosis? (if different than referring diagnosis) no  What was this (referring dx) caused by? Unspecified  Nature of Condition: Chronic (continuous duration > 3 months)   Laterality: Both  Current Functional Measure Score: LEFS 77/80  Objective measurements identify impairments when they are compared to normal values, the uninvolved extremity, and prior level of function.  []  Yes  [x]  No  Objective assessment of functional ability: Minimal functional limitations   Briefly describe symptoms: bilat hip pain with standing up to 30 mins and sleeping particularly side lying  How did symptoms start: with exercise  Average pain intensity:  Last 24 hours: 3  Past week: 3  How often does the pt experience symptoms? Intermittently  How much have the symptoms interfered with usual daily activities? A little bit  How has condition changed since care began at this facility? NA - initial visit  In general, how is the patients overall health? Very Good   BACK PAIN (STarT Back Screening Tool) No

## 2024-04-11 ENCOUNTER — Encounter (HOSPITAL_BASED_OUTPATIENT_CLINIC_OR_DEPARTMENT_OTHER): Payer: Self-pay | Admitting: Physical Therapy

## 2024-04-11 ENCOUNTER — Ambulatory Visit (HOSPITAL_BASED_OUTPATIENT_CLINIC_OR_DEPARTMENT_OTHER): Payer: Self-pay | Admitting: Physical Therapy

## 2024-04-11 DIAGNOSIS — R2689 Other abnormalities of gait and mobility: Secondary | ICD-10-CM

## 2024-04-11 DIAGNOSIS — M25551 Pain in right hip: Secondary | ICD-10-CM | POA: Diagnosis not present

## 2024-04-11 NOTE — Therapy (Signed)
 OUTPATIENT PHYSICAL THERAPY LOWER EXTREMITY TREATMENT   Patient Name: Erika Bolton MRN: [id number] DOB:1956-05-19, 68 y.o., female Today's Date: 04/11/2024  END OF SESSION:  PT End of Session - 04/11/24 1109     Visit Number 3    Number of Visits 5    Date for PT Re-Evaluation 05/23/24    Authorization Type [employer] mcr    Authorization - Visit Number 3    Authorization - Number of Visits 8    PT Start Time 1102    PT Stop Time 1140    PT Time Calculation (min) 38 min    Activity Tolerance Patient tolerated treatment well    Behavior During Therapy [facility] for tasks assessed/performed           Past Medical History:  Diagnosis Date   Colon polyps 2003   Detached retina    Hypertension    Hypertensive retinopathy    Kidney stone 2016   Left bundle branch block 2012   Menorrhagia    MGUS (monoclonal gammopathy of unknown significance)    Plantar fasciitis    Precancerous lesion 1987   mole excised from vulva   PVD (posterior vitreous detachment), right 09/07/2015   TIA (transient ischemic attack) 2024   Past Surgical History:  Procedure Laterality Date   ABLATION  11/10/2002   Hysteroscopic Thermal   CATARACT EXTRACTION     cyst on scalp     sebaccous(multiple times as a teen)   EYE SURGERY     gastro surgery  07/31/2010   LAPAROSCOPIC CHOLECYSTECTOMY  11/28/2001   left leg muscle surgery  07/31/2010   OTHER SURGICAL HISTORY     Sebaceous cyst on head removed 7x's   RETINAL DETACHMENT SURGERY     Patient Active Problem List   Diagnosis Date Noted   Cardiomyopathy (HCC) 10/03/2022   TIA (transient ischemic attack) 10/02/2022   HTN (hypertension) 10/02/2022   Cervical spinal stenosis 10/06/2021   Numbness 10/03/2021   Hyperreflexia 10/03/2021   Gait disturbance 10/03/2021   Epiretinal membrane (ERM) of both eyes 02/16/2021   Family history of colon cancer in father 02/14/2018   GERD (gastroesophageal reflux disease) 02/14/2018   History of adenomatous polyp  of colon 02/14/2018   Transaminitis 02/14/2018   Sprain of right ankle 01/14/2018   Hypercalcemia 01/17/2017   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   Kidney stone    Fatty infiltration of liver 01/18/2015   Vitamin D  deficiency 07/27/2014   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  Am sleeping better but I have been taking ibuprofen   Initial Subjective Pt a member here at [facility].  She reports she exercises 5-6 days a week in all settings (water;aerobic classes; Tai chi). Hips are very tight since this past spring. Pain worst at night when I lay on my sides.  PERTINENT HISTORY: TIA 2024 PAIN:  Are you having pain? Yes: NPRS scale: current 0/10; worst 5/10 Pain location: bilat hips Pain description: ache intermittent Aggravating factors: standing  still x 30 mins and side lying Relieving factors: resting/sitting 10 minutes  PRECAUTIONS: None  RED FLAGS: None   WEIGHT BEARING RESTRICTIONS: No  FALLS:  Has patient fallen in last 6 months? No  LIVING ENVIRONMENT: Lives with: lives with their family Lives in: House/apartment  Stairs: Yes: Internal: 16 steps; on right going up Has following equipment at home: None  OCCUPATION: retired; exercise  PLOF: Independent  PATIENT GOALS: relieve hip pain, be able to sleep side lying  NEXT MD VISIT: 2 month  OBJECTIVE:  Note: Objective measures were completed at Evaluation unless otherwise  noted.  DIAGNOSTIC FINDINGS: none  PATIENT SURVEYS:  LEFS: 77/80  COGNITION: Overall cognitive status: Within functional limits for tasks assessed     SENSATION: WFL  EDEMA: none   POSTURE: No Significant postural limitations  PALPATION: Mild TTP bilateral trochanter bursa  LOWER EXTREMITY ROM:  wfl  LOWER EXTREMITY MMT:  MMT Right eval Left eval  Hip flexion 40.5 36.0  Hip extension    Hip abduction 25.3 30.5  Hip adduction    Hip internal rotation    Hip external rotation    Knee flexion    Knee extension    Ankle dorsiflexion    Ankle plantarflexion    Ankle inversion    Ankle eversion     (Blank rows = not tested)   FUNCTIONAL TESTS:  5 times sit to stand: 10.57 Timed up and go (TUG): 6.98  GAIT: Distance walked: 500 ft Assistive device utilized: None Level of assistance: Complete Independence Comments: normal pattern                                                                                                                                TREATMENT  [facility] Adult PT Treatment:                                                DATE: 04/11/24 Pt seen for aquatic therapy today.  Treatment took place in water 3.5-4.75 ft in depth at the [facility] pool. Temp of water was 91.  Pt entered/exited the pool via stair suing step through pattern with hand rail.   *walking forward, back and side stepping in 3.6 ft with yellow HB carry bilaterally then unilaterally *Hamstring and gstroc stretching on steps *figure 4 stretch *hip flex stretch foot on 3rd step *side stepping ->lunge ue add/abd yellow HB 4 widths. (Difficulty coordinating) *yellow HB pull down wide stance then staggered stance 3.6 ft x 10 *standing ue support wall 1/2 clams *tandem stance ue support yellow HB-> shoulder add/abd x 10 *SLS as above. *cycling on noodle unsupported:ue support corner wall: hip add/abd; hip flex/ext   Pt requires the buoyancy and hydrostatic pressure of  water for support, and to offload joints by unweighting joint load by  at least 50 % in navel deep water and by at least 75-80% in chest to neck deep water.  Viscosity of the water is needed for resistance of strengthening. Water current perturbations provides challenge to standing balance requiring increased core activation.      PATIENT EDUCATION:  Education details: Discussed eval findings, rehab rationale, aquatic program progression/POC and pools in area. Patient is in agreement  Person educated: Patient Education method: Explanation Education comprehension: verbalized understanding  HOME EXERCISE PROGRAM: Pt follows individual exercise regimen 6 days a week  ASSESSMENT:  CLINICAL IMPRESSION: Pt states she feels her hips are better.  She continues following a modified personal exercise program allowing for off days of hip engagement. Pt tolerates progression of program with added stretching and dynamic balance /proprioceptive training. Goals ongoing     Patient is a 68 y.o. f who was seen today for physical therapy evaluation and treatment for bilat trochanteric bursitis. She reports stiffness then pain began back in Feb 2025 and has with slow progression, gotten worse. She has taken a 10 day course of Mobic  which seemed to help minimally.  Pain primarily at night when she is trying to sleep awakens her when side lying multiple times per night.  She has a fairly rigorous exercise regimen working out 6 days a week none of which appear to be too highly intense but is completed routinely 6 days a week.  Pt instructed to reduce her exercises to every other day allowing some rest/recovery with anticipation of reduction in hip pain. She is in agreement and vu.  She will benefit from a few aquatic therapy sessions for instruction on using the properties of water to reduce load in hips (buoyancy), strengthen (using viscosity and foam for resistance) and pressure to reduce inflammation of bursa  (hydrostatic pressure) to improve condition and relieve pain.  OBJECTIVE IMPAIRMENTS: pain.   ACTIVITY LIMITATIONS: standing  PARTICIPATION LIMITATIONS: meal prep  REHAB POTENTIAL: Excellent  CLINICAL DECISION MAKING: Stable/uncomplicated  EVALUATION COMPLEXITY: Low   GOALS: Goals reviewed with patient? No  SHORT TERM GOALS: Target date: 05/23/24 (delay in services beginning due to scheduling conflicts) Pt will tolerate full aquatic sessions consistently without increase in pain and with improving function to demonstrate good toleration and effectiveness of intervention.  Baseline: Goal status: INITIAL  2.  Pt will be indep with final aquatic HEP for continued management of condition Baseline:  Goal status: INITIAL  3.  Pt will report decrease in pain by at least 50% for improved toleration to activity/sleeping quality and to demonstrate improved management of pain. Baseline:  Goal status: INITIAL  4.  Pt will report toleration of stadning and cooking for up towards 45 mins unlimited due to hip pain. Baseline:  Goal status: INITIAL    LONG TERM GOALS:will assign at re-cert if approp   PLAN:  PT FREQUENCY: 1x/week  PT DURATION: 8 weeks 4 visits.  Scheduled out 8 weeks due to scheduling conflicts  PLANNED INTERVENTIONS: 97164- PT Re-evaluation, 97750- Physical Performance Testing, 97110-Therapeutic exercises, 97530- Therapeutic activity, 97112- Neuromuscular re-education, [phone]- Self Care, 02859- Manual therapy, [phone]- Gait training, [phone]- Orthotic Initial, [phone]- Aquatic Therapy, [phone]- Electrical stimulation (manual), [phone] (1-2 muscles), [phone] (3+ muscles)- Dry Needling, Patient/Family education, Balance training, Stair training, Taping, Joint mobilization, DME instructions, Cryotherapy, and Moist heat  PLAN FOR NEXT SESSION: aquatic for ROM and strengthening bilat hips; pain management; positioning   Ronal Kem) Jacquette Canales MPT 04/11/24 11:39 AM [facility] [address]  [place], [place], [address] Phone: [phone]   Fax:  [phone]  Date of referral: 02/20/24 Referring provider: Dasie Fitch, MD  Referring diagnosis? Bilater trochanteric bursitis Treatment diagnosis? (if different than referring diagnosis) no  What was this (referring dx) caused by? Unspecified  Nature of Condition: Chronic (continuous duration > 3 months)   Laterality: Both  Current Functional Measure Score: LEFS 77/80  Objective measurements identify impairments when they are compared to normal values, the uninvolved extremity, and prior level of function.  []  Yes  [x]  No  Objective assessment of functional ability: Minimal functional limitations   Briefly describe symptoms: bilat hip pain with standing up to 30 mins and sleeping particularly side lying  How did symptoms start: with exercise  Average pain intensity:  Last 24 hours: 3  Past week: 3  How often does the pt experience symptoms? Intermittently  How much have the symptoms interfered with usual daily activities? A little bit  How has condition changed since care began at this facility? NA - initial visit  In general, how is the patients overall health? Very Good   BACK PAIN (STarT Back Screening Tool) No

## 2024-04-17 ENCOUNTER — Encounter (HOSPITAL_BASED_OUTPATIENT_CLINIC_OR_DEPARTMENT_OTHER): Payer: Self-pay | Admitting: Physical Therapy

## 2024-04-17 ENCOUNTER — Ambulatory Visit (HOSPITAL_BASED_OUTPATIENT_CLINIC_OR_DEPARTMENT_OTHER): Admitting: Physical Therapy

## 2024-04-17 DIAGNOSIS — M25551 Pain in right hip: Secondary | ICD-10-CM | POA: Diagnosis not present

## 2024-04-17 DIAGNOSIS — R2689 Other abnormalities of gait and mobility: Secondary | ICD-10-CM

## 2024-04-17 NOTE — Therapy (Signed)
 OUTPATIENT PHYSICAL THERAPY LOWER EXTREMITY TREATMENT   Patient Name: Erika Bolton MRN: [id number] DOB:Apr 17, 1956, 68 y.o., female Today's Date: 04/17/2024  END OF SESSION:  PT End of Session - 04/17/24 1151     Visit Number 4    Number of Visits 5    Date for Recertification  05/23/24    Authorization Type UHC mcr    Authorization - Number of Visits 8    PT Start Time 1149    PT Stop Time 1230    PT Time Calculation (min) 41 min    Activity Tolerance Patient tolerated treatment well    Behavior During Therapy [facility] for tasks assessed/performed           Past Medical History:  Diagnosis Date   Colon polyps 2003   Detached retina    Hypertension    Hypertensive retinopathy    Kidney stone 2016   Left bundle branch block 2012   Menorrhagia    MGUS (monoclonal gammopathy of unknown significance)    Plantar fasciitis    Precancerous lesion 1987   mole excised from vulva   PVD (posterior vitreous detachment), right 09/07/2015   TIA (transient ischemic attack) 2024   Past Surgical History:  Procedure Laterality Date   ABLATION  11/10/2002   Hysteroscopic Thermal   CATARACT EXTRACTION     cyst on scalp     sebaccous(multiple times as a teen)   EYE SURGERY     gastro surgery  07/31/2010   LAPAROSCOPIC CHOLECYSTECTOMY  11/28/2001   left leg muscle surgery  07/31/2010   OTHER SURGICAL HISTORY     Sebaceous cyst on head removed 7x's   RETINAL DETACHMENT SURGERY     Patient Active Problem List   Diagnosis Date Noted   Cardiomyopathy (HCC) 10/03/2022   TIA (transient ischemic attack) 10/02/2022   HTN (hypertension) 10/02/2022   Cervical spinal stenosis 10/06/2021   Numbness 10/03/2021   Hyperreflexia 10/03/2021   Gait disturbance 10/03/2021   Epiretinal membrane (ERM) of both eyes 02/16/2021   Family history of colon cancer in father 02/14/2018   GERD (gastroesophageal reflux disease) 02/14/2018   History of adenomatous polyp of colon 02/14/2018    Transaminitis 02/14/2018   Sprain of right ankle 01/14/2018   Hypercalcemia 01/17/2017   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   Kidney stone    Fatty infiltration of liver 01/18/2015   Vitamin D  deficiency 07/27/2014   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 Pain up at night waking frequently. Stopped taking ibuprofen at night because I was getting better. Pain 3/10 today and achy.   Initial Subjective Pt a member here at [facility].  She reports she exercises 5-6 days a week in all settings (water;aerobic classes; Tai chi). Hips are very tight since this past spring. Pain worst at night when I lay on my sides.  PERTINENT HISTORY: TIA 2024 PAIN:  Are you having pain? Yes: NPRS scale: current 0/10; worst 5/10 Pain location: bilat hips Pain description: ache intermittent Aggravating factors:  standing still x 30 mins and side lying Relieving factors: resting/sitting 10 minutes  PRECAUTIONS: None  RED FLAGS: None   WEIGHT BEARING RESTRICTIONS: No  FALLS:  Has patient fallen in last 6 months? No  LIVING ENVIRONMENT: Lives with: lives with their family Lives in: House/apartment  Stairs: Yes: Internal: 16 steps; on right going up Has following equipment at home: None  OCCUPATION: retired; exercise  PLOF: Independent  PATIENT GOALS: relieve hip pain, be able to sleep side lying  NEXT MD VISIT: 2 month  OBJECTIVE:  Note: Objective measures were completed  at Evaluation unless otherwise noted.  DIAGNOSTIC FINDINGS: none  PATIENT SURVEYS:  LEFS: 77/80  COGNITION: Overall cognitive status: Within functional limits for tasks assessed     SENSATION: WFL  EDEMA: none   POSTURE: No Significant postural limitations  PALPATION: Mild TTP bilateral trochanter bursa  LOWER EXTREMITY ROM:  wfl  LOWER EXTREMITY MMT:  MMT Right eval Left eval  Hip flexion 40.5 36.0  Hip extension    Hip abduction 25.3 30.5  Hip adduction    Hip internal rotation    Hip external rotation    Knee flexion    Knee extension    Ankle dorsiflexion    Ankle plantarflexion    Ankle inversion    Ankle eversion     (Blank rows = not tested)   FUNCTIONAL TESTS:  5 times sit to stand: 10.57 Timed up and go (TUG): 6.98  GAIT: Distance walked: 500 ft Assistive device utilized: None Level of assistance: Complete Independence Comments: normal pattern                                                                                                                                TREATMENT  [facility] Adult PT Treatment:                                                DATE: 04/17/24 Pt seen for aquatic therapy today.  Treatment took place in water 3.5-4.75 ft in depth at the [facility] pool. Temp of water was 91.  Pt entered/exited the pool via stair suing step through pattern with hand rail.   *walking *yellow HB pull down wide stance then staggered stance 3.6 ft x 10 *hip hinge verbal and TC for execution *walking forward, back and side stepping in 3.6 ft with yellow HB carry bilaterally then unilaterally *standing ue support wall 1/2 clams *tandem stance ue support yellow HB-> shoulder add/abd x 10 *SLS as above. *seated 3rd step isometric ball squeeze x 8 10s hold *cycling on noodle unsupported:ue support corner wall: hip add/abd; hip flex/ext   Pt requires the buoyancy and hydrostatic pressure of water for support, and to offload joints by  unweighting joint load by at least 50 % in navel deep water  and by at least 75-80% in chest to neck deep water.  Viscosity of the water is needed for resistance of strengthening. Water current perturbations provides challenge to standing balance requiring increased core activation.      PATIENT EDUCATION:  Education details: Discussed eval findings, rehab rationale, aquatic program progression/POC and pools in area. Patient is in agreement  Person educated: Patient Education method: Explanation Education comprehension: verbalized understanding  HOME EXERCISE PROGRAM: Pt follows individual exercise regimen 6 days a week  ASSESSMENT:  CLINICAL IMPRESSION: Increased hip pain, maybe associated with reduction of ibuprofen.  She is encouraged to message MD to clarify how much she should be taking. She vu. Pt reports good responses to all aquatic sessions with reduction in pain and minimal fatigue. Focused on core strengthening and balance. Good toleration without increase in hip pain. Pt edu on continuing to modify level of exercise.  Goals ongoing      Patient is a 68 y.o. f who was seen today for physical therapy evaluation and treatment for bilat trochanteric bursitis. She reports stiffness then pain began back in Feb 2025 and has with slow progression, gotten worse. She has taken a 10 day course of Mobic  which seemed to help minimally.  Pain primarily at night when she is trying to sleep awakens her when side lying multiple times per night.  She has a fairly rigorous exercise regimen working out 6 days a week none of which appear to be too highly intense but is completed routinely 6 days a week.  Pt instructed to reduce her exercises to every other day allowing some rest/recovery with anticipation of reduction in hip pain. She is in agreement and vu.  She will benefit from a few aquatic therapy sessions for instruction on using the properties of water to reduce load in hips (buoyancy),  strengthen (using viscosity and foam for resistance) and pressure to reduce inflammation of bursa (hydrostatic pressure) to improve condition and relieve pain.  OBJECTIVE IMPAIRMENTS: pain.   ACTIVITY LIMITATIONS: standing  PARTICIPATION LIMITATIONS: meal prep  REHAB POTENTIAL: Excellent  CLINICAL DECISION MAKING: Stable/uncomplicated  EVALUATION COMPLEXITY: Low   GOALS: Goals reviewed with patient? No  SHORT TERM GOALS: Target date: 05/23/24 (delay in services beginning due to scheduling conflicts) Pt will tolerate full aquatic sessions consistently without increase in pain and with improving function to demonstrate good toleration and effectiveness of intervention.  Baseline: Goal status: Met 04/17/24  2.  Pt will be indep with final aquatic HEP for continued management of condition Baseline:  Goal status: INITIAL  3.  Pt will report decrease in pain by at least 50% for improved toleration to activity/sleeping quality and to demonstrate improved management of pain. Baseline:  Goal status: INITIAL  4.  Pt will report toleration of stadning and cooking for up towards 45 mins unlimited due to hip pain. Baseline:  Goal status: INITIAL    LONG TERM GOALS:will assign at re-cert if approp   PLAN:  PT FREQUENCY: 1x/week  PT DURATION: 8 weeks 4 visits.  Scheduled out 8 weeks due to scheduling conflicts  PLANNED INTERVENTIONS: 97164- PT Re-evaluation, 97750- Physical Performance Testing, 97110-Therapeutic exercises, 97530- Therapeutic activity, [id number]- Neuromuscular re-education, 97535- Self Care, 02859- Manual therapy, [id number]- Gait training, [phone]- Orthotic Initial, [phone]- Aquatic Therapy, [phone]- Electrical stimulation (manual), [phone] (1-2 muscles), [phone] (3+ muscles)- Dry Needling, Patient/Family education, Balance training, Stair training, Taping, Joint mobilization, DME instructions, Cryotherapy, and Moist heat  PLAN FOR NEXT SESSION: aquatic for ROM and strengthening bilat  hips;  pain management; positioning   Ronal Foots) Harris Kistler MPT 04/17/24 12:11 PM [facility] [address] [place], [place], [address] Phone: [phone]   Fax:  [phone]  Date of referral: 02/20/24 Referring provider: Dasie Fitch, MD  Referring diagnosis? Bilater trochanteric bursitis Treatment diagnosis? (if different than referring diagnosis) no  What was this (referring dx) caused by? Unspecified  Nature of Condition: Chronic (continuous duration > 3 months)   Laterality: Both  Current Functional Measure Score: LEFS 77/80  Objective measurements identify impairments when they are compared to normal values, the uninvolved extremity, and prior level of function.  []  Yes  [x]  No  Objective assessment of functional ability: Minimal functional limitations   Briefly describe symptoms: bilat hip pain with standing up to 30 mins and sleeping particularly side lying  How did symptoms start: with exercise  Average pain intensity:  Last 24 hours: 3  Past week: 3  How often does the pt experience symptoms? Intermittently  How much have the symptoms interfered with usual daily activities? A little bit  How has condition changed since care began at this facility? NA - initial visit  In general, how is the patients overall health? Very Good   BACK PAIN (STarT Back Screening Tool) No

## 2024-04-25 ENCOUNTER — Other Ambulatory Visit (HOSPITAL_BASED_OUTPATIENT_CLINIC_OR_DEPARTMENT_OTHER): Payer: Self-pay

## 2024-04-25 MED ORDER — FLUZONE HIGH-DOSE 0.5 ML IM SUSY
0.5000 mL | PREFILLED_SYRINGE | Freq: Once | INTRAMUSCULAR | 0 refills | Status: AC
  Filled 2024-04-25: qty 0.5, 1d supply, fill #0

## 2024-04-29 ENCOUNTER — Ambulatory Visit (HOSPITAL_BASED_OUTPATIENT_CLINIC_OR_DEPARTMENT_OTHER): Admitting: Physical Therapy

## 2024-04-30 ENCOUNTER — Ambulatory Visit (HOSPITAL_BASED_OUTPATIENT_CLINIC_OR_DEPARTMENT_OTHER): Attending: Internal Medicine | Admitting: Physical Therapy

## 2024-04-30 ENCOUNTER — Encounter (HOSPITAL_BASED_OUTPATIENT_CLINIC_OR_DEPARTMENT_OTHER): Payer: Self-pay | Admitting: Physical Therapy

## 2024-04-30 DIAGNOSIS — M25552 Pain in left hip: Secondary | ICD-10-CM | POA: Insufficient documentation

## 2024-04-30 DIAGNOSIS — R2689 Other abnormalities of gait and mobility: Secondary | ICD-10-CM | POA: Insufficient documentation

## 2024-04-30 DIAGNOSIS — M25551 Pain in right hip: Secondary | ICD-10-CM | POA: Insufficient documentation

## 2024-04-30 NOTE — Therapy (Signed)
 OUTPATIENT PHYSICAL THERAPY LOWER EXTREMITY TREATMENT PHYSICAL THERAPY DISCHARGE SUMMARY  Visits from Start of Care: 5  Current functional level related to goals / functional outcomes: indep   Remaining deficits: Hip pain   Education / Equipment: Management of condition; HEP   Patient agrees to discharge. Patient goals were all but 1 goal met. Patient is being discharged due to maximized rehab potential.    Patient Name: Erika Bolton MRN: [id number] DOB:March 28, 1956, 68 y.o., female Today's Date: 04/30/2024  END OF SESSION:  PT End of Session - 04/30/24 1547     Visit Number 5    Number of Visits 5    Date for Recertification  05/23/24    Authorization Type [employer] mcr    Authorization - Number of Visits 8    PT Start Time 1447    PT Stop Time 1530    PT Time Calculation (min) 43 min    Activity Tolerance Patient tolerated treatment well    Behavior During Therapy [facility] for tasks assessed/performed            Past Medical History:  Diagnosis Date   Colon polyps 2003   Detached retina    Hypertension    Hypertensive retinopathy    Kidney stone 2016   Left bundle branch block 2012   Menorrhagia    MGUS (monoclonal gammopathy of unknown significance)    Plantar fasciitis    Precancerous lesion 1987   mole excised from vulva   PVD (posterior vitreous detachment), right 09/07/2015   TIA (transient ischemic attack) 2024   Past Surgical History:  Procedure Laterality Date   ABLATION  11/10/2002   Hysteroscopic Thermal   CATARACT EXTRACTION     cyst on scalp     sebaccous(multiple times as a teen)   EYE SURGERY     gastro surgery  07/31/2010   LAPAROSCOPIC CHOLECYSTECTOMY  11/28/2001   left leg muscle surgery  07/31/2010   OTHER SURGICAL HISTORY     Sebaceous cyst on head removed 7x's   RETINAL DETACHMENT SURGERY     Patient Active Problem List   Diagnosis Date Noted   Cardiomyopathy (HCC) 10/03/2022   TIA (transient ischemic attack) 10/02/2022   HTN  (hypertension) 10/02/2022   Cervical spinal stenosis 10/06/2021   Numbness 10/03/2021   Hyperreflexia 10/03/2021   Gait disturbance 10/03/2021   Epiretinal membrane (ERM) of both eyes 02/16/2021   Family history of colon cancer in father 02/14/2018   GERD (gastroesophageal reflux disease) 02/14/2018   History of adenomatous polyp of colon 02/14/2018   Transaminitis 02/14/2018   Sprain of right ankle 01/14/2018   Hypercalcemia 01/17/2017   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   Kidney stone    Fatty infiltration of liver 01/18/2015   Vitamin D  deficiency 07/27/2014   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   Monoclonal gammopathy of unknown significance (MGUS) 01/05/2012    PCP: Trula Dellen MD  REFERRING PROVIDER: Aleck Dawn MD  REFERRING DIAG:  M70.61 (ICD-10-CM) - Trochanteric bursitis, right hip  M70.62 (ICD-10-CM) - Trochanteric bursitis, left hip    THERAPY DIAG:  Bilateral hip pain  Other abnormalities of gait and mobility  Rationale for Evaluation and Treatment: Rehabilitation  ONSET DATE: 6 months  SUBJECTIVE:   SUBJECTIVE STATEMENT: I did the water aerobic class on Sunday, it didn't seem hard nor did it hurt but that night was bad. I used to think it was getting better but it is not.  I will e  be getting a steroid shot in both hips next week.  Current 1/10, middle of night 4-5/10   Initial Subjective Pt a member here at [facility].  She reports she exercises 5-6 days a week in all settings (water;aerobic classes; Tai chi). Hips are very tight since this past spring. Pain worst at night when I lay on my sides.  PERTINENT HISTORY: TIA 2024 PAIN:  Are you having pain? Yes: NPRS scale: current 0/10; worst 5/10 Pain location: bilat hips Pain description: ache intermittent Aggravating factors: standing still x 30 mins and side lying Relieving factors: resting/sitting 10 minutes  PRECAUTIONS: None  RED  FLAGS: None   WEIGHT BEARING RESTRICTIONS: No  FALLS:  Has patient fallen in last 6 months? No  LIVING ENVIRONMENT: Lives with: lives with their family Lives in: House/apartment  Stairs: Yes: Internal: 16 steps; on right going up Has following equipment at home: None  OCCUPATION: retired; exercise  PLOF: Independent  PATIENT GOALS: relieve hip pain, be able to sleep side lying  NEXT MD VISIT: 2 month  OBJECTIVE:  Note: Objective measures were completed at Evaluation unless otherwise noted.  DIAGNOSTIC FINDINGS: none  PATIENT SURVEYS:  LEFS: 77/80  COGNITION: Overall cognitive status: Within functional limits for tasks assessed     SENSATION: WFL  EDEMA: none   POSTURE: No Significant postural limitations  PALPATION: Mild TTP bilateral trochanter bursa  LOWER EXTREMITY ROM:  wfl  LOWER EXTREMITY MMT:  MMT Right eval Left eval  Hip flexion 40.5 36.0  Hip extension    Hip abduction 25.3 30.5  Hip adduction    Hip internal rotation    Hip external rotation    Knee flexion    Knee extension    Ankle dorsiflexion    Ankle plantarflexion    Ankle inversion    Ankle eversion     (Blank rows = not tested)   FUNCTIONAL TESTS:  5 times sit to stand: 10.57 Timed up and go (TUG): 6.98  GAIT: Distance walked: 500 ft Assistive device utilized: None Level of assistance: Complete Independence Comments: normal pattern                                                                                                                                TREATMENT  [facility] Adult PT Treatment:                                                DATE: 04/30/24 Pt seen for aquatic therapy today.  Treatment took place in water 3.5-4.75 ft in depth at the [facility] pool. Temp of water was 91.  Pt entered/exited the pool via stair suing step through pattern with hand rail.   Exercises - Hand buoy carry: Forward and Backward; bilaterally->unilaterally   - Standing  Hip Hinge   -  Noodle press  - Tandem Stance   - Single Leg Stance  - Single Leg Stance Clamshell   - Sit to Stand Without Arm Support  - Cycling on noodle/noodle wrapped under shoulders    Pt requires the buoyancy and hydrostatic pressure of water for support, and to offload joints by unweighting joint load by at least 50 % in navel deep water and by at least 75-80% in chest to neck deep water.  Viscosity of the water is needed for resistance of strengthening. Water current perturbations provides challenge to standing balance requiring increased core activation.      PATIENT EDUCATION:  Education details: Discussed eval findings, rehab rationale, aquatic program progression/POC and pools in area. Patient is in agreement  Person educated: Patient Education method: Explanation Education comprehension: verbalized understanding  HOME EXERCISE PROGRAM: Pt follows individual exercise regimen 6 days a week Access Code: [email or web address] URL: [email or web address] Date: 04/30/2024 Prepared by: Frankie Deshaun Schou  Exercises - Hand buoy carry: Forward and Backward; bilaterally->unilaterally  - 1 x daily - 1-3 x weekly - Standing Hip Hinge  - 1 x daily - 1-3 x weekly - 1-2 sets - 10 reps - Noodle press  - 1 x daily - 1-3 x weekly - 1-2 sets - 10 reps - Tandem Stance  - 1 x daily - 1-3 x weekly - 1 sets - 1 reps - 20 hold - Single Leg Stance  - 1 x daily - 1-3 x weekly - 1 sets - 1 reps - 20 hold - Single Leg Stance Clamshell  - 1 x daily - 1-3 x weekly - 1-2 sets - 10 reps - Sit to Stand Without Arm Support  - 1 x daily - 1-3 x weekly - 3 sets - 10 reps - Cycling on noodle/noodle wrapped under shoulders  - 1 x daily - 1-3 x weekly  ASSESSMENT:  CLINICAL IMPRESSION: Pt becoming frustrated with continued pain in hips at night. She has steroid injections scheduled for next week. She completes all exercises well without pain or fatigue.  HEP created and issued.  Pt chooses to end therapy  and will continue with HEP as instructed. Most goals met other than pain goal.  She is indep with HEP.  Pt edu on scaling back normal exercise regimen to determine if pain reduces then slowly add exercises back in as able.  She VU.  She will contact me with any questions or concerns going forward through Mychart or while here at [facility]. She has reached her max potential in setting      Patient is a 68 y.o. f who was seen today for physical therapy evaluation and treatment for bilat trochanteric bursitis. She reports stiffness then pain began back in Feb 2025 and has with slow progression, gotten worse. She has taken a 10 day course of Mobic  which seemed to help minimally.  Pain primarily at night when she is trying to sleep awakens her when side lying multiple times per night.  She has a fairly rigorous exercise regimen working out 6 days a week none of which appear to be too highly intense but is completed routinely 6 days a week.  Pt instructed to reduce her exercises to every other day allowing some rest/recovery with anticipation of reduction in hip pain. She is in agreement and vu.  She will benefit from a few aquatic therapy sessions for instruction on using the properties of water to reduce load in hips (buoyancy), strengthen (using viscosity and foam for resistance) and  pressure to reduce inflammation of bursa (hydrostatic pressure) to improve condition and relieve pain.  OBJECTIVE IMPAIRMENTS: pain.   ACTIVITY LIMITATIONS: standing  PARTICIPATION LIMITATIONS: meal prep  REHAB POTENTIAL: Excellent  CLINICAL DECISION MAKING: Stable/uncomplicated  EVALUATION COMPLEXITY: Low   GOALS: Goals reviewed with patient? No  SHORT TERM GOALS: Target date: 05/23/24 (delay in services beginning due to scheduling conflicts) Pt will tolerate full aquatic sessions consistently without increase in pain and with improving function to demonstrate good toleration and effectiveness of intervention.   Baseline: Goal status: Met 04/17/24  2.  Pt will be indep with final aquatic HEP for continued management of condition Baseline:  Goal status: Met 04/30/24  3.  Pt will report decrease in pain by at least 50% for improved toleration to activity/sleeping quality and to demonstrate improved management of pain. Baseline:  Goal status: Not Met.  Pain has waxed and waned 04/30/24  4.  Pt will report toleration of stadning and cooking for up towards 45 mins unlimited due to hip pain. Baseline:  Goal status: Met 04/30/24    LONG TERM GOALS:will assign at re-cert if approp   PLAN:  PT FREQUENCY: 1x/week  PT DURATION: 8 weeks 4 visits.  Scheduled out 8 weeks due to scheduling conflicts  PLANNED INTERVENTIONS: 97164- PT Re-evaluation, 97750- Physical Performance Testing, 97110-Therapeutic exercises, 97530- Therapeutic activity, 97112- Neuromuscular re-education, [phone]- Self Care, 02859- Manual therapy, [phone]- Gait training, [phone]- Orthotic Initial, [phone]- Aquatic Therapy, [phone]- Electrical stimulation (manual), [phone] (1-2 muscles), [phone] (3+ muscles)- Dry Needling, Patient/Family education, Balance training, Stair training, Taping, Joint mobilization, DME instructions, Cryotherapy, and Moist heat  PLAN FOR NEXT SESSION: aquatic for ROM and strengthening bilat hips; pain management; positioning   Ronal Foots) Auda Finfrock MPT 04/30/24 3:51 PM [facility] [address] [place], [place], [address] Phone: [phone]   Fax:  [phone]  Date of referral: 02/20/24 Referring provider: Dasie Fitch, MD  Referring diagnosis? Bilater trochanteric bursitis Treatment diagnosis? (if different than referring diagnosis) no  What was this (referring dx) caused by? Unspecified  Nature of Condition: Chronic (continuous duration > 3 months)   Laterality: Both  Current Functional Measure Score: LEFS 77/80  Objective measurements identify  impairments when they are compared to normal values, the uninvolved extremity, and prior level of function.  []  Yes  [x]  No  Objective assessment of functional ability: Minimal functional limitations   Briefly describe symptoms: bilat hip pain with standing up to 30 mins and sleeping particularly side lying  How did symptoms start: with exercise  Average pain intensity:  Last 24 hours: 3  Past week: 3  How often does the pt experience symptoms? Intermittently  How much have the symptoms interfered with usual daily activities? A little bit  How has condition changed since care began at this facility? NA - initial visit  In general, how is the patients overall health? Very Good   BACK PAIN (STarT Back Screening Tool) No

## 2024-05-07 ENCOUNTER — Ambulatory Visit (HOSPITAL_BASED_OUTPATIENT_CLINIC_OR_DEPARTMENT_OTHER): Admitting: Physical Therapy

## 2024-05-21 ENCOUNTER — Ambulatory Visit (HOSPITAL_BASED_OUTPATIENT_CLINIC_OR_DEPARTMENT_OTHER): Admitting: Physical Therapy

## 2024-05-28 ENCOUNTER — Ambulatory Visit (HOSPITAL_BASED_OUTPATIENT_CLINIC_OR_DEPARTMENT_OTHER): Admitting: Physical Therapy

## 2024-06-12 ENCOUNTER — Other Ambulatory Visit (HOSPITAL_BASED_OUTPATIENT_CLINIC_OR_DEPARTMENT_OTHER): Payer: Self-pay

## 2024-06-12 MED ORDER — COMIRNATY 30 MCG/0.3ML IM SUSY
0.3000 mL | PREFILLED_SYRINGE | Freq: Once | INTRAMUSCULAR | 0 refills | Status: AC
  Filled 2024-06-12: qty 0.3, 1d supply, fill #0

## 2024-07-25 ENCOUNTER — Other Ambulatory Visit (HOSPITAL_BASED_OUTPATIENT_CLINIC_OR_DEPARTMENT_OTHER): Payer: Self-pay

## 2024-08-05 ENCOUNTER — Other Ambulatory Visit (HOSPITAL_COMMUNITY): Payer: Self-pay | Admitting: Sports Medicine

## 2024-08-05 DIAGNOSIS — M25551 Pain in right hip: Secondary | ICD-10-CM

## 2024-08-05 DIAGNOSIS — M25552 Pain in left hip: Secondary | ICD-10-CM

## 2024-08-12 ENCOUNTER — Other Ambulatory Visit (HOSPITAL_BASED_OUTPATIENT_CLINIC_OR_DEPARTMENT_OTHER): Payer: Self-pay

## 2024-08-12 MED ORDER — TRETINOIN 0.025 % EX CREA
TOPICAL_CREAM | CUTANEOUS | 2 refills | Status: AC
  Filled 2024-08-12: qty 20, 30d supply, fill #0

## 2024-08-18 ENCOUNTER — Other Ambulatory Visit (HOSPITAL_BASED_OUTPATIENT_CLINIC_OR_DEPARTMENT_OTHER): Payer: Self-pay

## 2024-08-19 ENCOUNTER — Ambulatory Visit (HOSPITAL_COMMUNITY)
Admission: RE | Admit: 2024-08-19 | Discharge: 2024-08-19 | Disposition: A | Discharge status: Home / Self Care | Source: Ambulatory Visit | Attending: Sports Medicine | Admitting: Sports Medicine

## 2024-08-19 ENCOUNTER — Ambulatory Visit (HOSPITAL_COMMUNITY)
Admission: RE | Admit: 2024-08-19 | Discharge: 2024-08-19 | Disposition: A | Source: Ambulatory Visit | Attending: Sports Medicine | Admitting: Sports Medicine

## 2024-08-19 DIAGNOSIS — M25551 Pain in right hip: Secondary | ICD-10-CM | POA: Insufficient documentation

## 2024-08-19 DIAGNOSIS — M25552 Pain in left hip: Secondary | ICD-10-CM

## 2024-09-01 ENCOUNTER — Ambulatory Visit (HOSPITAL_BASED_OUTPATIENT_CLINIC_OR_DEPARTMENT_OTHER): Admitting: Cardiology

## 2024-09-01 NOTE — Progress Notes (Incomplete)
" °  Cardiology Office Note:  .   Date:  09/01/2024  ID:  Erika Bolton, DOB [birth year], MRN [id number] PCP: Dwight Trula SQUIBB, MD  [facility] Providers Cardiologist:  Debby Sor, MD (Inactive) {  History of Present Illness: .   Erika Bolton is a 69 y.o. 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 She was previously followed by Dr. Sor and established care with me on 09/01/24.   Pertinent CV history: Had cath in 2012 after found to have LBBB and abnormal nuclear stress test. Cath was normal. Echo at that time was normal with septal motion consistent with LBBB. She presented in 2024 with concern for TIA. Echo done at that time showed EF of 35% and hypokinesis of the inferior wall. Coronary CT was without CAD. She was started on GDMT. Repeat echo two months later showed improvement in EF to 50-55% without wall motion abnormalities.  ROS: Denies chest pain, shortness of breath at rest or with normal exertion. No PND, orthopnea, LE edema or unexpected weight gain. No syncope or palpitations. ROS otherwise negative except as noted.   Studies Reviewed: SABRA    EKG:       Physical Exam:   VS:  LMP 08/01/2007    Wt Readings from Last 3 Encounters:  03/18/24 170 lb (77.1 kg)  01/25/24 173 lb 3.2 oz (78.6 kg)  11/21/23 169 lb (76.7 kg)    GEN: Well nourished, well developed in no acute distress HEENT: Normal, moist mucous membranes NECK: No JVD CARDIAC: regular rhythm, normal S1 and S2, no rubs or gallops. No murmur. VASCULAR: Radial and DP pulses 2+ bilaterally. No carotid bruits RESPIRATORY:  Clear to auscultation without rales, wheezing or rhonchi  ABDOMEN: Soft, non-tender, non-distended MUSCULOSKELETAL:  Ambulates independently SKIN: Warm and dry, no edema NEUROLOGIC:  Alert and oriented x 3. No focal neuro deficits noted. PSYCHIATRIC:  Normal affect    ASSESSMENT AND PLAN: .    Systolic dysfunction with recovered ejection  fraction LBBB Hypertension -EF reduced in the setting of TIA 09/2022 -no CAD by remote cath or coronary CT 09/2022 -GDMT: metoprolol  succinate 25 mg daily, olmesartan  20 mg daily -is on amlodipine for hypertension. Discussed uptitrating ARB vs. Spironolactone -her EF is now normalized. We did discuss SGLT2i in general today.  History of TIA -on aspirin , rosuvastatin   CV risk counseling and prevention -recommend heart healthy/Mediterranean diet, with whole grains, fruits, vegetable, fish, lean meats, nuts, and olive oil. Limit salt. -recommend moderate walking, 3-5 times/week for 30-50 minutes each session. Aim for at least 150 minutes/week. Goal should be pace of 3 miles/hours, or walking 1.5 miles in 30 minutes -recommend avoidance of tobacco products. Avoid excess alcohol.  Dispo: ***  Signed, Shelda Bruckner, MD   Shelda Bruckner, MD, PhD, [facility]  [facility]  [place]  [facility] at [facility] at [facility] [address] [place], [place] [address] [phone]   "

## 2024-09-03 ENCOUNTER — Other Ambulatory Visit: Payer: Self-pay | Admitting: General Practice

## 2024-09-03 MED ORDER — ROSUVASTATIN CALCIUM 20 MG PO TABS: 20.0000 mg | ORAL_TABLET | Freq: Every day | ORAL | 0 refills | Status: AC

## 2024-09-03 NOTE — Telephone Encounter (Signed)
 In accordance with refill protocols, please review and address the following requirements before this medication refill can be authorized:  Labs   - Lipid Panel within 12 months

## 2024-09-11 ENCOUNTER — Ambulatory Visit (HOSPITAL_BASED_OUTPATIENT_CLINIC_OR_DEPARTMENT_OTHER): Admitting: Orthopaedic Surgery

## 2024-10-08 ENCOUNTER — Encounter (INDEPENDENT_AMBULATORY_CARE_PROVIDER_SITE_OTHER): Admitting: Ophthalmology

## 2024-10-10 ENCOUNTER — Encounter (INDEPENDENT_AMBULATORY_CARE_PROVIDER_SITE_OTHER): Admitting: Ophthalmology

## 2024-10-14 ENCOUNTER — Encounter (INDEPENDENT_AMBULATORY_CARE_PROVIDER_SITE_OTHER): Admitting: Ophthalmology

## 2024-11-27 ENCOUNTER — Ambulatory Visit (HOSPITAL_BASED_OUTPATIENT_CLINIC_OR_DEPARTMENT_OTHER): Admitting: Cardiology

## 2025-01-16 ENCOUNTER — Inpatient Hospital Stay

## 2025-01-29 ENCOUNTER — Ambulatory Visit: Admitting: Hematology and Oncology

## 2025-03-24 ENCOUNTER — Encounter: Admitting: Obstetrics and Gynecology
